# Patient Record
Sex: Female | Born: 1960
Health system: Southern US, Community
[De-identification: ages and names within clinical notes are randomized; demographics above are authoritative.]

## PROBLEM LIST (undated history)

## (undated) DIAGNOSIS — H3552 Pigmentary retinal dystrophy: Secondary | ICD-10-CM

## (undated) DIAGNOSIS — T7840XA Allergy, unspecified, initial encounter: Secondary | ICD-10-CM

## (undated) DIAGNOSIS — F419 Anxiety disorder, unspecified: Secondary | ICD-10-CM

## (undated) DIAGNOSIS — M1990 Unspecified osteoarthritis, unspecified site: Secondary | ICD-10-CM

## (undated) DIAGNOSIS — F329 Major depressive disorder, single episode, unspecified: Secondary | ICD-10-CM

## (undated) DIAGNOSIS — N83209 Unspecified ovarian cyst, unspecified side: Secondary | ICD-10-CM

## (undated) DIAGNOSIS — G8929 Other chronic pain: Secondary | ICD-10-CM

## (undated) DIAGNOSIS — E669 Obesity, unspecified: Secondary | ICD-10-CM

## (undated) DIAGNOSIS — K219 Gastro-esophageal reflux disease without esophagitis: Secondary | ICD-10-CM

## (undated) DIAGNOSIS — K589 Irritable bowel syndrome without diarrhea: Secondary | ICD-10-CM

## (undated) DIAGNOSIS — D649 Anemia, unspecified: Secondary | ICD-10-CM

## (undated) DIAGNOSIS — M549 Dorsalgia, unspecified: Secondary | ICD-10-CM

## (undated) DIAGNOSIS — F32A Depression, unspecified: Secondary | ICD-10-CM

## (undated) DIAGNOSIS — A4902 Methicillin resistant Staphylococcus aureus infection, unspecified site: Secondary | ICD-10-CM

## (undated) DIAGNOSIS — I10 Essential (primary) hypertension: Secondary | ICD-10-CM

## (undated) HISTORY — DX: Unspecified ovarian cyst, unspecified side: N83.209

## (undated) HISTORY — PX: APPENDECTOMY: SHX54

## (undated) HISTORY — DX: Methicillin resistant Staphylococcus aureus infection, unspecified site: A49.02

## (undated) HISTORY — PX: KNEE ARTHROSCOPY W/ MENISCAL REPAIR: SHX1877

## (undated) HISTORY — DX: Allergy, unspecified, initial encounter: T78.40XA

## (undated) HISTORY — DX: Essential (primary) hypertension: I10

## (undated) HISTORY — DX: Dorsalgia, unspecified: M54.9

## (undated) HISTORY — DX: Pigmentary retinal dystrophy: H35.52

## (undated) HISTORY — PX: CHOLECYSTECTOMY: SHX55

## (undated) HISTORY — DX: Irritable bowel syndrome, unspecified: K58.9

## (undated) HISTORY — DX: Obesity, unspecified: E66.9

## (undated) HISTORY — DX: Gastro-esophageal reflux disease without esophagitis: K21.9

## (undated) HISTORY — DX: Other chronic pain: G89.29

---
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
## 1997-07-05 ENCOUNTER — Inpatient Hospital Stay (HOSPITAL_COMMUNITY): Admission: RE | Admit: 1997-07-05 | Discharge: 1997-07-06 | Payer: Self-pay | Admitting: Surgery

## 1998-07-14 ENCOUNTER — Encounter: Payer: Self-pay | Admitting: Obstetrics and Gynecology

## 1998-07-14 ENCOUNTER — Ambulatory Visit (HOSPITAL_COMMUNITY): Admission: RE | Admit: 1998-07-14 | Discharge: 1998-07-14 | Payer: Self-pay | Admitting: Obstetrics and Gynecology

## 1998-07-19 HISTORY — PX: ENDOMETRIAL BIOPSY: SHX622

## 1998-08-19 ENCOUNTER — Encounter (INDEPENDENT_AMBULATORY_CARE_PROVIDER_SITE_OTHER): Payer: Self-pay

## 1998-08-19 ENCOUNTER — Other Ambulatory Visit: Admission: RE | Admit: 1998-08-19 | Discharge: 1998-08-19 | Payer: Self-pay | Admitting: Obstetrics and Gynecology

## 1999-08-25 ENCOUNTER — Other Ambulatory Visit: Admission: RE | Admit: 1999-08-25 | Discharge: 1999-08-25 | Payer: Self-pay | Admitting: Family Medicine

## 2000-03-02 ENCOUNTER — Encounter (INDEPENDENT_AMBULATORY_CARE_PROVIDER_SITE_OTHER): Payer: Self-pay

## 2000-03-02 ENCOUNTER — Other Ambulatory Visit: Admission: RE | Admit: 2000-03-02 | Discharge: 2000-03-02 | Payer: Self-pay | Admitting: Obstetrics and Gynecology

## 2001-07-19 ENCOUNTER — Other Ambulatory Visit: Admission: RE | Admit: 2001-07-19 | Discharge: 2001-07-19 | Payer: Self-pay | Admitting: Family Medicine

## 2002-07-31 ENCOUNTER — Other Ambulatory Visit: Admission: RE | Admit: 2002-07-31 | Discharge: 2002-07-31 | Payer: Self-pay | Admitting: Family Medicine

## 2003-08-28 ENCOUNTER — Encounter: Payer: Self-pay | Admitting: Family Medicine

## 2003-08-28 ENCOUNTER — Other Ambulatory Visit: Admission: RE | Admit: 2003-08-28 | Discharge: 2003-08-28 | Payer: Self-pay | Admitting: Family Medicine

## 2003-08-28 LAB — CONVERTED CEMR LAB: Pap Smear: NORMAL

## 2004-07-27 ENCOUNTER — Ambulatory Visit: Payer: Self-pay | Admitting: Family Medicine

## 2005-02-05 ENCOUNTER — Ambulatory Visit: Payer: Self-pay | Admitting: Family Medicine

## 2005-02-08 ENCOUNTER — Ambulatory Visit: Payer: Self-pay | Admitting: Family Medicine

## 2005-03-22 ENCOUNTER — Ambulatory Visit: Payer: Self-pay | Admitting: Family Medicine

## 2005-06-03 ENCOUNTER — Ambulatory Visit: Payer: Self-pay | Admitting: Specialist

## 2005-08-10 ENCOUNTER — Ambulatory Visit: Payer: Self-pay | Admitting: Family Medicine

## 2005-08-24 ENCOUNTER — Encounter: Admission: RE | Admit: 2005-08-24 | Discharge: 2005-08-24 | Payer: Self-pay | Admitting: Surgery

## 2005-09-03 ENCOUNTER — Encounter: Admission: RE | Admit: 2005-09-03 | Discharge: 2005-09-03 | Payer: Self-pay | Admitting: Surgery

## 2006-01-18 DIAGNOSIS — A4902 Methicillin resistant Staphylococcus aureus infection, unspecified site: Secondary | ICD-10-CM

## 2006-01-18 HISTORY — DX: Methicillin resistant Staphylococcus aureus infection, unspecified site: A49.02

## 2006-05-09 ENCOUNTER — Ambulatory Visit: Payer: Self-pay | Admitting: Family Medicine

## 2006-05-16 ENCOUNTER — Telehealth (INDEPENDENT_AMBULATORY_CARE_PROVIDER_SITE_OTHER): Payer: Self-pay | Admitting: *Deleted

## 2006-05-19 HISTORY — PX: HERNIA REPAIR: SHX51

## 2006-05-30 ENCOUNTER — Inpatient Hospital Stay (HOSPITAL_COMMUNITY): Admission: EM | Admit: 2006-05-30 | Discharge: 2006-06-06 | Payer: Self-pay | Admitting: *Deleted

## 2006-06-03 ENCOUNTER — Encounter (INDEPENDENT_AMBULATORY_CARE_PROVIDER_SITE_OTHER): Payer: Self-pay | Admitting: General Surgery

## 2006-06-10 ENCOUNTER — Inpatient Hospital Stay (HOSPITAL_COMMUNITY): Admission: EM | Admit: 2006-06-10 | Discharge: 2006-06-15 | Payer: Self-pay | Admitting: *Deleted

## 2006-07-08 ENCOUNTER — Ambulatory Visit: Payer: Self-pay | Admitting: Internal Medicine

## 2006-07-11 ENCOUNTER — Inpatient Hospital Stay: Payer: Self-pay | Admitting: Surgery

## 2006-07-15 ENCOUNTER — Encounter: Payer: Self-pay | Admitting: Family Medicine

## 2006-07-15 DIAGNOSIS — H3552 Pigmentary retinal dystrophy: Secondary | ICD-10-CM | POA: Insufficient documentation

## 2006-07-15 DIAGNOSIS — G43909 Migraine, unspecified, not intractable, without status migrainosus: Secondary | ICD-10-CM | POA: Insufficient documentation

## 2006-07-15 DIAGNOSIS — M545 Low back pain, unspecified: Secondary | ICD-10-CM | POA: Insufficient documentation

## 2006-07-15 DIAGNOSIS — N946 Dysmenorrhea, unspecified: Secondary | ICD-10-CM | POA: Insufficient documentation

## 2006-07-15 DIAGNOSIS — F341 Dysthymic disorder: Secondary | ICD-10-CM | POA: Insufficient documentation

## 2006-07-15 DIAGNOSIS — I8390 Asymptomatic varicose veins of unspecified lower extremity: Secondary | ICD-10-CM | POA: Insufficient documentation

## 2006-07-15 DIAGNOSIS — J45909 Unspecified asthma, uncomplicated: Secondary | ICD-10-CM | POA: Insufficient documentation

## 2006-07-15 DIAGNOSIS — K219 Gastro-esophageal reflux disease without esophagitis: Secondary | ICD-10-CM | POA: Insufficient documentation

## 2006-07-15 DIAGNOSIS — J309 Allergic rhinitis, unspecified: Secondary | ICD-10-CM | POA: Insufficient documentation

## 2006-07-15 DIAGNOSIS — N3941 Urge incontinence: Secondary | ICD-10-CM

## 2006-07-29 ENCOUNTER — Encounter: Payer: Self-pay | Admitting: Family Medicine

## 2006-10-19 ENCOUNTER — Encounter: Payer: Self-pay | Admitting: Family Medicine

## 2006-11-03 ENCOUNTER — Ambulatory Visit: Payer: Self-pay | Admitting: Family Medicine

## 2006-11-03 DIAGNOSIS — M2550 Pain in unspecified joint: Secondary | ICD-10-CM | POA: Insufficient documentation

## 2006-11-03 DIAGNOSIS — R609 Edema, unspecified: Secondary | ICD-10-CM | POA: Insufficient documentation

## 2006-11-03 LAB — CONVERTED CEMR LAB
Nitrite: NEGATIVE
Specific Gravity, Urine: 1.01
pH: 6.5

## 2006-11-04 ENCOUNTER — Encounter (INDEPENDENT_AMBULATORY_CARE_PROVIDER_SITE_OTHER): Payer: Self-pay | Admitting: Internal Medicine

## 2007-01-05 ENCOUNTER — Telehealth: Payer: Self-pay | Admitting: Family Medicine

## 2007-05-12 ENCOUNTER — Ambulatory Visit: Payer: Self-pay | Admitting: Family Medicine

## 2007-05-12 DIAGNOSIS — K432 Incisional hernia without obstruction or gangrene: Secondary | ICD-10-CM | POA: Insufficient documentation

## 2007-05-12 DIAGNOSIS — R350 Frequency of micturition: Secondary | ICD-10-CM

## 2007-05-12 LAB — CONVERTED CEMR LAB
Blood in Urine, dipstick: NEGATIVE
Glucose, Urine, Semiquant: NEGATIVE
Ketones, urine, test strip: NEGATIVE
Urobilinogen, UA: 0.2
WBC Urine, dipstick: NEGATIVE

## 2007-05-15 LAB — CONVERTED CEMR LAB
Albumin: 3.4 g/dL — ABNORMAL LOW (ref 3.5–5.2)
Alkaline Phosphatase: 68 units/L (ref 39–117)
BUN: 9 mg/dL (ref 6–23)
Calcium: 9 mg/dL (ref 8.4–10.5)
Eosinophils Absolute: 0.2 10*3/uL (ref 0.0–0.7)
Eosinophils Relative: 2 % (ref 0.0–5.0)
GFR calc Af Amer: 140 mL/min
GFR calc non Af Amer: 115 mL/min
HCT: 30.6 % — ABNORMAL LOW (ref 36.0–46.0)
HDL: 33.6 mg/dL — ABNORMAL LOW (ref >39.0)
Hemoglobin: 10.1 g/dL — ABNORMAL LOW (ref 12.0–15.0)
MCV: 82.1 fL (ref 78.0–100.0)
Monocytes Absolute: 0.6 10*3/uL (ref 0.1–1.0)
Neutro Abs: 6.2 10*3/uL (ref 1.4–7.7)
Platelets: 324 10*3/uL (ref 150–400)
Potassium: 3.9 meq/L (ref 3.5–5.1)
RDW: 15.5 % — ABNORMAL HIGH (ref 11.5–14.6)
Sodium: 140 meq/L (ref 135–145)
TSH: 1.1 microintl units/mL (ref 0.35–5.50)
Total Protein: 6.6 g/dL (ref 6.0–8.3)
Triglycerides: 190 mg/dL — ABNORMAL HIGH (ref 0–149)

## 2007-05-25 ENCOUNTER — Encounter: Payer: Self-pay | Admitting: Gastroenterology

## 2007-05-25 ENCOUNTER — Encounter: Admission: RE | Admit: 2007-05-25 | Discharge: 2007-05-25 | Payer: Self-pay | Admitting: General Surgery

## 2007-06-05 ENCOUNTER — Ambulatory Visit: Payer: Self-pay | Admitting: Family Medicine

## 2007-06-05 DIAGNOSIS — L253 Unspecified contact dermatitis due to other chemical products: Secondary | ICD-10-CM | POA: Insufficient documentation

## 2007-06-13 ENCOUNTER — Ambulatory Visit: Payer: Self-pay | Admitting: Family Medicine

## 2007-06-13 ENCOUNTER — Encounter: Payer: Self-pay | Admitting: Family Medicine

## 2007-06-27 ENCOUNTER — Encounter: Payer: Self-pay | Admitting: Family Medicine

## 2007-07-11 ENCOUNTER — Ambulatory Visit: Payer: Self-pay | Admitting: Family Medicine

## 2007-07-19 ENCOUNTER — Telehealth: Payer: Self-pay | Admitting: Family Medicine

## 2007-08-29 ENCOUNTER — Ambulatory Visit (HOSPITAL_COMMUNITY): Admission: RE | Admit: 2007-08-29 | Discharge: 2007-08-29 | Payer: Self-pay | Admitting: Family Medicine

## 2007-09-13 ENCOUNTER — Ambulatory Visit: Payer: Self-pay | Admitting: Obstetrics and Gynecology

## 2007-11-15 ENCOUNTER — Telehealth: Payer: Self-pay | Admitting: Family Medicine

## 2007-12-05 ENCOUNTER — Telehealth: Payer: Self-pay | Admitting: Family Medicine

## 2008-01-23 ENCOUNTER — Encounter: Payer: Self-pay | Admitting: Family Medicine

## 2008-03-18 DIAGNOSIS — I10 Essential (primary) hypertension: Secondary | ICD-10-CM

## 2008-03-18 HISTORY — PX: CYSTOSCOPY: SUR368

## 2008-03-18 HISTORY — DX: Essential (primary) hypertension: I10

## 2008-03-27 ENCOUNTER — Encounter: Payer: Self-pay | Admitting: Family Medicine

## 2008-03-29 ENCOUNTER — Ambulatory Visit: Payer: Self-pay | Admitting: Family Medicine

## 2008-03-29 DIAGNOSIS — I10 Essential (primary) hypertension: Secondary | ICD-10-CM | POA: Insufficient documentation

## 2008-03-29 DIAGNOSIS — R197 Diarrhea, unspecified: Secondary | ICD-10-CM

## 2008-04-01 LAB — CONVERTED CEMR LAB
BUN: 14 mg/dL (ref 6–23)
Basophils Absolute: 0.1 10*3/uL (ref 0.0–0.1)
Bilirubin, Direct: 0.1 mg/dL (ref 0.0–0.3)
Chloride: 103 meq/L (ref 96–112)
Cholesterol: 192 mg/dL (ref 0–200)
Eosinophils Absolute: 0.2 10*3/uL (ref 0.0–0.7)
Glucose, Bld: 87 mg/dL (ref 70–99)
HCT: 38 % (ref 36.0–46.0)
HDL: 33 mg/dL — ABNORMAL LOW (ref >39.0)
Hemoglobin: 12.6 g/dL (ref 12.0–15.0)
MCHC: 33.1 g/dL (ref 30.0–36.0)
MCV: 82.1 fL (ref 78.0–100.0)
Monocytes Absolute: 0.5 10*3/uL (ref 0.1–1.0)
Neutro Abs: 6.9 10*3/uL (ref 1.4–7.7)
Phosphorus: 4.1 mg/dL (ref 2.3–4.6)
Platelets: 285 10*3/uL (ref 150–400)
Potassium: 4.1 meq/L (ref 3.5–5.1)
RDW: 14.8 % — ABNORMAL HIGH (ref 11.5–14.6)
Sodium: 140 meq/L (ref 135–145)
TSH: 0.66 microintl units/mL (ref 0.35–5.50)
Total Bilirubin: 0.7 mg/dL (ref 0.3–1.2)
Total CHOL/HDL Ratio: 5.8
Triglycerides: 118 mg/dL (ref 0–149)

## 2008-05-09 ENCOUNTER — Ambulatory Visit: Payer: Self-pay | Admitting: Family Medicine

## 2008-05-09 DIAGNOSIS — E785 Hyperlipidemia, unspecified: Secondary | ICD-10-CM

## 2008-07-12 ENCOUNTER — Telehealth (INDEPENDENT_AMBULATORY_CARE_PROVIDER_SITE_OTHER): Payer: Self-pay | Admitting: Internal Medicine

## 2008-08-12 ENCOUNTER — Ambulatory Visit: Payer: Self-pay | Admitting: Obstetrics & Gynecology

## 2008-08-12 ENCOUNTER — Encounter: Payer: Self-pay | Admitting: Obstetrics & Gynecology

## 2008-08-13 ENCOUNTER — Encounter: Payer: Self-pay | Admitting: Obstetrics & Gynecology

## 2008-08-13 LAB — CONVERTED CEMR LAB
Trich, Wet Prep: NONE SEEN
WBC, Wet Prep HPF POC: NONE SEEN

## 2008-09-19 ENCOUNTER — Telehealth: Payer: Self-pay | Admitting: Family Medicine

## 2008-10-09 ENCOUNTER — Ambulatory Visit: Payer: Self-pay | Admitting: Family Medicine

## 2008-10-09 DIAGNOSIS — M79609 Pain in unspecified limb: Secondary | ICD-10-CM

## 2008-10-10 ENCOUNTER — Ambulatory Visit: Payer: Self-pay | Admitting: Family Medicine

## 2008-10-10 ENCOUNTER — Encounter: Payer: Self-pay | Admitting: Family Medicine

## 2008-10-17 ENCOUNTER — Ambulatory Visit: Payer: Self-pay

## 2008-11-13 ENCOUNTER — Ambulatory Visit: Payer: Self-pay | Admitting: Obstetrics and Gynecology

## 2008-11-13 ENCOUNTER — Encounter: Payer: Self-pay | Admitting: Family Medicine

## 2008-11-14 ENCOUNTER — Encounter: Payer: Self-pay | Admitting: Obstetrics and Gynecology

## 2008-11-14 LAB — CONVERTED CEMR LAB: Yeast Wet Prep HPF POC: NONE SEEN

## 2008-12-09 ENCOUNTER — Telehealth: Payer: Self-pay | Admitting: Family Medicine

## 2008-12-10 ENCOUNTER — Ambulatory Visit: Payer: Self-pay | Admitting: Family Medicine

## 2008-12-10 DIAGNOSIS — B370 Candidal stomatitis: Secondary | ICD-10-CM

## 2008-12-17 ENCOUNTER — Ambulatory Visit: Payer: Self-pay | Admitting: Ophthalmology

## 2008-12-18 HISTORY — PX: EYE SURGERY: SHX253

## 2008-12-24 ENCOUNTER — Ambulatory Visit: Payer: Self-pay | Admitting: Ophthalmology

## 2009-01-08 ENCOUNTER — Ambulatory Visit: Payer: Self-pay | Admitting: Family Medicine

## 2009-01-11 LAB — CONVERTED CEMR LAB
ALT: 21 units/L (ref 0–35)
AST: 16 units/L (ref 0–37)
Cholesterol: 175 mg/dL (ref 0–200)
HDL: 38.8 mg/dL — ABNORMAL LOW (ref >39.00)
LDL Cholesterol: 111 mg/dL — ABNORMAL HIGH (ref 0–99)
VLDL: 24.8 mg/dL (ref 0.0–40.0)

## 2009-01-15 ENCOUNTER — Ambulatory Visit: Payer: Self-pay | Admitting: Family Medicine

## 2009-01-15 DIAGNOSIS — K5900 Constipation, unspecified: Secondary | ICD-10-CM | POA: Insufficient documentation

## 2009-04-08 ENCOUNTER — Telehealth: Payer: Self-pay | Admitting: Family Medicine

## 2009-05-20 ENCOUNTER — Telehealth: Payer: Self-pay | Admitting: Family Medicine

## 2009-05-21 ENCOUNTER — Ambulatory Visit: Payer: Self-pay | Admitting: Family Medicine

## 2009-05-21 DIAGNOSIS — M5380 Other specified dorsopathies, site unspecified: Secondary | ICD-10-CM | POA: Insufficient documentation

## 2009-06-02 ENCOUNTER — Telehealth: Payer: Self-pay | Admitting: Family Medicine

## 2009-08-07 ENCOUNTER — Encounter (INDEPENDENT_AMBULATORY_CARE_PROVIDER_SITE_OTHER): Payer: Self-pay | Admitting: *Deleted

## 2009-08-07 ENCOUNTER — Ambulatory Visit: Payer: Self-pay | Admitting: Family Medicine

## 2009-08-07 DIAGNOSIS — M25569 Pain in unspecified knee: Secondary | ICD-10-CM | POA: Insufficient documentation

## 2009-08-07 DIAGNOSIS — K589 Irritable bowel syndrome without diarrhea: Secondary | ICD-10-CM | POA: Insufficient documentation

## 2009-08-07 LAB — CONVERTED CEMR LAB
Bacteria, UA: 0
Epithelial cells, urine: 0 /lpf
Glucose, Urine, Semiquant: NEGATIVE
Ketones, urine, test strip: NEGATIVE
Nitrite: NEGATIVE
RBC / HPF: 0
Urine crystals, microscopic: 0 /hpf
WBC, UA: 0 cells/hpf

## 2009-09-02 ENCOUNTER — Ambulatory Visit: Payer: Self-pay | Admitting: Family Medicine

## 2009-09-04 LAB — CONVERTED CEMR LAB
ALT: 17 units/L (ref 0–35)
AST: 14 units/L (ref 0–37)
Basophils Absolute: 0 10*3/uL (ref 0.0–0.1)
Basophils Relative: 0 % (ref 0–1)
Bilirubin, Direct: 0.1 mg/dL (ref 0.0–0.3)
Calcium: 9.4 mg/dL (ref 8.4–10.5)
Cholesterol: 181 mg/dL (ref 0–200)
Eosinophils Absolute: 0.1 10*3/uL (ref 0.0–0.7)
Eosinophils Relative: 2 % (ref 0–5)
Glucose, Bld: 102 mg/dL — ABNORMAL HIGH (ref 70–99)
HCT: 37.9 % (ref 36.0–46.0)
Lymphs Abs: 1.9 10*3/uL (ref 0.7–4.0)
MCHC: 32.5 g/dL (ref 30.0–36.0)
MCV: 86.1 fL (ref 78.0–100.0)
Neutrophils Relative %: 65 % (ref 43–77)
Platelets: 263 10*3/uL (ref 150–400)
RDW: 13.9 % (ref 11.5–15.5)
Sodium: 138 meq/L (ref 135–145)
TSH: 1.345 microintl units/mL (ref 0.350–4.500)
Total CHOL/HDL Ratio: 4.8
Total Protein: 6.9 g/dL (ref 6.0–8.3)
Triglycerides: 137 mg/dL (ref <150)

## 2009-09-05 ENCOUNTER — Ambulatory Visit: Payer: Self-pay | Admitting: Family Medicine

## 2009-09-08 ENCOUNTER — Encounter: Payer: Self-pay | Admitting: Family Medicine

## 2009-09-10 ENCOUNTER — Telehealth: Payer: Self-pay | Admitting: Family Medicine

## 2009-09-18 ENCOUNTER — Encounter: Payer: Self-pay | Admitting: Family Medicine

## 2009-09-18 LAB — HM MAMMOGRAPHY

## 2009-09-26 ENCOUNTER — Ambulatory Visit: Payer: Self-pay | Admitting: Gastroenterology

## 2009-09-26 ENCOUNTER — Encounter (INDEPENDENT_AMBULATORY_CARE_PROVIDER_SITE_OTHER): Payer: Self-pay | Admitting: *Deleted

## 2009-09-26 DIAGNOSIS — R1013 Epigastric pain: Secondary | ICD-10-CM

## 2009-09-26 DIAGNOSIS — K3189 Other diseases of stomach and duodenum: Secondary | ICD-10-CM | POA: Insufficient documentation

## 2009-09-29 LAB — CONVERTED CEMR LAB
ALT: 21 units/L (ref 0–35)
Albumin: 3.8 g/dL (ref 3.5–5.2)
Basophils Relative: 0.6 % (ref 0.0–3.0)
Calcium: 9.5 mg/dL (ref 8.4–10.5)
Eosinophils Absolute: 0.2 10*3/uL (ref 0.0–0.7)
GFR calc non Af Amer: 100.19 mL/min (ref >60)
Hemoglobin: 12.2 g/dL (ref 12.0–15.0)
MCHC: 34.5 g/dL (ref 30.0–36.0)
MCV: 84.9 fL (ref 78.0–100.0)
Monocytes Absolute: 0.5 10*3/uL (ref 0.1–1.0)
Neutro Abs: 6.4 10*3/uL (ref 1.4–7.7)
Neutrophils Relative %: 68.7 % (ref 43.0–77.0)
RBC: 4.17 M/uL (ref 3.87–5.11)
Total Bilirubin: 0.7 mg/dL (ref 0.3–1.2)
Total Protein: 6.6 g/dL (ref 6.0–8.3)

## 2009-10-02 ENCOUNTER — Encounter (INDEPENDENT_AMBULATORY_CARE_PROVIDER_SITE_OTHER): Payer: Self-pay | Admitting: *Deleted

## 2009-10-02 ENCOUNTER — Ambulatory Visit (HOSPITAL_COMMUNITY): Admission: RE | Admit: 2009-10-02 | Discharge: 2009-10-02 | Payer: Self-pay | Admitting: Gastroenterology

## 2009-10-02 ENCOUNTER — Ambulatory Visit: Payer: Self-pay | Admitting: Gastroenterology

## 2009-10-29 ENCOUNTER — Telehealth: Payer: Self-pay | Admitting: Family Medicine

## 2009-12-10 ENCOUNTER — Telehealth (INDEPENDENT_AMBULATORY_CARE_PROVIDER_SITE_OTHER): Payer: Self-pay | Admitting: *Deleted

## 2010-02-06 ENCOUNTER — Telehealth: Payer: Self-pay | Admitting: Family Medicine

## 2010-02-08 ENCOUNTER — Encounter: Payer: Self-pay | Admitting: *Deleted

## 2010-02-17 NOTE — Assessment & Plan Note (Signed)
Summary: CPX/RBH  R/S FROM 07/07/09   Vital Signs:  Patient profile:   50 year old female Height:      64 inches Weight:      313.50 pounds BMI:     54.01 Temp:     98.7 degrees F oral Pulse rate:   80 / minute Pulse rhythm:   regular BP sitting:   128 / 82  (left arm) Cuff size:   large  Vitals Entered By: Lewanda Rife LPN (September 05, 2009 10:39 AM) CC: CPX  LMP 10/2008   History of Present Illness: here for wellness exam and to rev chronic med problems   feeling fair in general lots of stress  husb sick with diverticulitis and perf - in hosp -- will have to have hemicolectomy is problem with being out of work- financial she is likely also loosing her job-- upsetting situation, had to take some pepcid will have ins thru end of sept has appt on sept 9th for GI   given the circumstances doing good   wt is down 9 lb from last visit !-- is working on not stress eating -- proud of herself  morbidly obese with bmi of 54  HTN in good control with 128/82 today  used to see gyn  had endometrial ablation  had bad menses in fall and also vaginitis -- pap then ? no menses since then  last 3 paps - she thinks were normal    mam --needs one / over a year  has fibrocystic breasts - nothing new   has appt fri for derm - skin exam self exam   Td 06  gluc 102   lipids good and stable with trig 137 and HDL 38 and LDL 116    Allergies: 1)  ! Septra Ds (Sulfamethoxazole-Trimethoprim) 2)  ! Voltaren 3)  ! Flagyl 4)  * Bextra 5)  Lodine 6)  Detrol La (Tolterodine Tartrate) 7)  * Birth Control  Past History:  Past Medical History: Last updated: 01/15/2009 Allergic rhinitis Asthma GERD chroinic pain back and legs- OA spine  morbid obesity retinitis pigmentosa 08 MRSA r leg  ovarian cyst  HTN (3/10) cataracts  IBS  urol-- [facility]   Past Surgical History: Last updated: 01/15/2009 Had surgeries last month for ruptured hernia repair after fall and then two  more surgeries within as many weeks for complications.- May 2008 appy 08 Cholecystectomy 06/99 C-Section Umbilical hernia repair 06/99 hernia sx 08  Bladder surgery- age 14 (dilation?) Endometrial bx. negative 07/00 Endo negative 02/02 cystoscopy neg 3/10 meniscal tear in L knee cataract removal L 12/10  Family History: Last updated: 2008/04/10 Father: Deceased Mother: Depression, thyroid (general mental illness), ? HTN  distant maternal family- lots of high blood pressure  Siblings: 2 brothers, one with depression, injuries secondary MVA, one deceased CV- father's side- grandparents and aunts /uncles  HBP- MGM DM- Mat. uncle Pcousins - with cancer (? what type)  Social History: Last updated: 08/07/2009 Marital Status: Married Children: 1 Occupation:  non smoker  vision impaired  works for [employer]   Risk Factors: Smoking Status: never (07/15/2006)  Review of Systems General:  Complains of fatigue; denies loss of appetite and malaise. Eyes:  Denies blurring and eye irritation. CV:  Denies chest pain or discomfort, palpitations, and shortness of breath with exertion. Resp:  Denies cough, shortness of breath, and wheezing. GI:  Complains of indigestion; denies abdominal pain, change in bowel habits, and nausea. GU:  Denies dysuria and  urinary frequency. MS:  Complains of joint pain and stiffness; denies cramps and muscle weakness. Derm:  Denies itching, lesion(s), poor wound healing, and rash. Neuro:  Denies numbness and tingling. Psych:  Denies anxiety and depression. Endo:  Denies excessive thirst and excessive urination. Heme:  Denies abnormal bruising and bleeding.  Physical Exam  General:  morbidly obese and well  pt is blind  Head:  normocephalic, atraumatic, and no abnormalities observed.   Eyes:  vision grossly intact, pupils equal, pupils round, and pupils reactive to light.  no conjunctival pallor, injection or icterus  Ears:  R ear normal  and L ear normal.   Nose:  no nasal discharge.   Mouth:  pharynx pink and moist.   Neck:  supple with full rom and no masses or thyromegally, no JVD or carotid bruit  Chest Wall:  No deformities, masses, or tenderness noted. Breasts:  No mass, nodules, thickening, tenderness, bulging, retraction, inflamation, nipple discharge or skin changes noted.   Lungs:  Normal respiratory effort, chest expands symmetrically. Lungs are clear to auscultation, no crackles or wheezes. Heart:  Normal rate and regular rhythm. S1 and S2 normal without gallop, murmur, click, rub or other extra sounds. Abdomen:  Bowel sounds positive,abdomen soft and non-tender without masses, organomegaly or hernias noted. no renal bruits  Msk:  No deformity or scoliosis noted of thoracic or lumbar spine.  poor rom knees Pulses:  R and L carotid,radial,femoral,dorsalis pedis and posterior tibial pulses are full and equal bilaterally Extremities:  trace pedal edema Neurologic:  sensation intact to light touch, gait normal, and DTRs symmetrical and normal.   Skin:  Intact without suspicious lesions or rashes some lentigos  Cervical Nodes:  No lymphadenopathy noted Axillary Nodes:  No palpable lymphadenopathy Inguinal Nodes:  No significant adenopathy Psych:  normal affect, talkative and pleasant    Impression & Recommendations:  Problem # 1:  HEALTH MAINTENANCE EXAM (ICD-V70.0) Assessment Comment Only reviewed health habits including diet, exercise and skin cancer prevention reviewed health maintenance list and family history again stressed imp of wt loss and progress so far  ? thinks pap was in fall  / too early  Problem # 2:  OTHER SCREENING MAMMOGRAM (ICD-V76.12) annual mammogram scheduled adv pt to continue regular self breast exams non remarkable breast exam today  Orders: Radiology Referral (Radiology)  Problem # 3:  HYPERLIPIDEMIA (ICD-272.4) Assessment: Unchanged  lipids are in good control with diet  currently will continue to follow rev low sat fat diet   Labs Reviewed: SGOT: 14 (09/02/2009)   SGPT: 17 (09/02/2009)   HDL:38 (09/02/2009), 38.80 (01/08/2009)  LDL:116 (09/02/2009), 111 (01/08/2009)  Chol:181 (09/02/2009), 175 (01/08/2009)  Trig:137 (09/02/2009), 124.0 (01/08/2009)  Problem # 4:  ESSENTIAL HYPERTENSION (ICD-401.9) Assessment: Unchanged  ok with lisinopril low dose currently enc further wt loss  Her updated medication list for this problem includes:    Lisinopril 5 Mg Tabs (Lisinopril) .Marland Kitchen... 1 by mouth once daily in am  BP today: 128/82 Prior BP: 130/80 (08/07/2009)  Labs Reviewed: K+: 4.5 (09/02/2009) Creat: : 0.75 (09/02/2009)   Chol: 181 (09/02/2009)   HDL: 38 (09/02/2009)   LDL: 116 (09/02/2009)   TG: 137 (09/02/2009)  Problem # 5:  GERD (ICD-530.81) adding pepcid as needed for gastritis type symptoms will f/u with GI in fall as planned for eval  Her updated medication list for this problem includes:    Nexium 40 Mg Cpdr (Esomeprazole magnesium) .Marland Kitchen... 1 by mouth once daily once daily in am  Famotidine 10 Mg Tabs (Famotidine) ..... Otc as directed.  Complete Medication List: 1)  Alprazolam 1 Mg Tabs (Alprazolam) .Marland Kitchen.. 1 by mouth up to three times a day as needed  warn- may sedate 2)  Flonase 50 Mcg/act Susp (Fluticasone propionate) .... 2 puffs in each nostril daily 3)  Advair Diskus 100-50 Mcg/dose Misc (Fluticasone-salmeterol) .Marland Kitchen.. 1 puff twice a day. 4)  Zoloft 50 Mg Tabs (Sertraline hcl) .... 1/2 tab daily by mouth 5)  Allegra 180 Mg Tabs (Fexofenadine hcl) .Marland Kitchen.. 1 tab daily 6)  Singulair 10 Mg Tabs (Montelukast sodium) .Marland Kitchen.. 1 tab daily 7)  Nexium 40 Mg Cpdr (Esomeprazole magnesium) .Marland Kitchen.. 1 by mouth once daily once daily in am 8)  One-daily Multivitamins Tabs (Multiple vitamin) .... Take one tablet by mouth every other day 9)  Calcium  .... Take two  by mouth daily 10)  B Complex 100 Tabs (B complex vitamins) .... Take one tablet by mouth every other  day 11)  Vesicare 5 Mg Tabs (Solifenacin succinate) .Marland Kitchen.. 1 by mouth every other day for bladder 12)  Lisinopril 5 Mg Tabs (Lisinopril) .Marland Kitchen.. 1 by mouth once daily in am 13)  Nystatin 100000 Unit/ml Susp (Nystatin) .Marland Kitchen.. 1 teaspoon three times a day swish and swallow until mouth is clear as needed 14)  Flexeril 10 Mg Tabs (Cyclobenzaprine hcl) .Marland Kitchen.. 1 by mouth up to three times a day as needed back spasm careful of sedation 15)  Vicodin 5-500 Mg Tabs (Hydrocodone-acetaminophen) .Marland Kitchen.. 1 tab every 6 hours as needed pain. 16)  Vitamin C 1000 Mg Tabs (Ascorbic acid) .... Take 1 tablet by mouth once a day 17)  Omega 3-6-9 (1000mg )  .... Take one tab by mouth daily 18)  Epidrin 325-65-100 Mg Caps (Apap-isometheptene-dichloral) .... Take one every 6 hours as needed 19)  Ventolin Hfa 108 (90 Base) Mcg/act Aers (Albuterol sulfate) .... As needed for asthma attack 20)  Citrucel 500 Mg Tabs (Methylcellulose (laxative)) .... Take one tab twice a day every other day. 21)  Famotidine 10 Mg Tabs (Famotidine) .... Otc as directed.  Patient Instructions: 1)  labs look good  2)  keep up the good work with weight loss- keep it up  3)  stay as active as possible  4)  no change in medicine  5)  we will schedule mammogram at check out  6)  please send for last pap smear from [facility]   Current Allergies (reviewed today): ! SEPTRA DS (SULFAMETHOXAZOLE-TRIMETHOPRIM) ! VOLTAREN ! FLAGYL * BEXTRA LODINE DETROL LA (TOLTERODINE TARTRATE) * BIRTH CONTROL

## 2010-02-17 NOTE — Letter (Signed)
Summary: Appt Reminder 2  [facility]  [address] [place], [place] [address]   Phone: [phone]  Fax: [phone]        October 02, 2009 MRN: [id number]    Monica Madden [address] [place], [place]  [address]    Dear Ms. Mackins,   You have a return appointment with Dr. Christella Hartigan on 11/18/09 at 9:15 am.  Please remember to bring a complete list of the medicines you are taking, your insurance card and your co-pay.  If you have to cancel or reschedule this appointment, please call before 5:00 pm the evening before to avoid a cancellation fee.  If you have any questions or concerns, please call [phone].    Sincerely,    Chales Abrahams CMA (AAMA)  Appended Document: Appt Reminder 2 letter mailed

## 2010-02-17 NOTE — Procedures (Signed)
Summary: Instructions for procedure/[place]  Instructions for procedure/[place]   Imported By: Sherian Rein 10/02/2009 10:11:08  _____________________________________________________________________  External Attachment:    Type:   Image     Comment:   External Document

## 2010-02-17 NOTE — Letter (Signed)
Summary: New Patient letter  [facility]  [address] [place], [place] [address]   Phone: [phone]  Fax: [phone]       08/07/2009 MRN: [id number]  Monica Madden [address] [place], [place]  [address]  Dear Monica Madden,  Welcome to the Gastroenterology Division at [facility].    You are scheduled to see Dr.  Rob Bunting on September 26, 2009 at 2:30pm on the 3rd floor at [facility], [address]  We ask that you try to arrive at our office 15 minutes prior to your appointment time to allow for check-in.  We would like you to complete the enclosed self-administered evaluation form prior to your visit and bring it with you on the day of your appointment.  We will review it with you.  Also, please bring a complete list of all your medications or, if you prefer, bring the medication bottles and we will list them.  Please bring your insurance card so that we may make a copy of it.  If your insurance requires a referral to see a specialist, please bring your referral form from your primary care physician.  Co-payments are due at the time of your visit and may be paid by cash, check or credit card.     Your office visit will consist of a consult with your physician (includes a physical exam), any laboratory testing he/she may order, scheduling of any necessary diagnostic testing (e.g. x-ray, ultrasound, CT-scan), and scheduling of a procedure (e.g. Endoscopy, Colonoscopy) if required.  Please allow enough time on your schedule to allow for any/all of these possibilities.    If you cannot keep your appointment, please call [phone] to cancel or reschedule prior to your appointment date.  This allows [place] the opportunity to schedule an appointment for another patient in need of care.  If you do not cancel or reschedule by 5 p.m. the business day prior to your appointment date, you will be charged a $50.00 late cancellation/no-show fee.    Thank  you for choosing [facility] for your medical needs.  We appreciate the opportunity to care for you.  Please visit [place] at our website  to learn more about our practice.                     Sincerely,                                                             The Gastroenterology Division

## 2010-02-17 NOTE — Letter (Signed)
Summary: [facility] Instructions  [facility]  [address] [place], [place] [address]   Phone: [phone]  Fax: [phone]       Monica Madden    [birth year]    MRN: [id number]        Procedure Day /Date:10/02/09  THURS     Arrival Time:1130 am     Procedure Time:1230 pm     Location of Procedure:                     X  [facility] ( Outpatient Registration)   PREPARATION FOR COLONOSCOPY WITH MOVIPREP   Starting 5 days prior to your procedure 09/27/09 do not eat nuts, seeds, popcorn, corn, beans, peas,  salads, or any raw vegetables.  Do not take any fiber supplements (e.g. Metamucil, Citrucel, and Benefiber).  THE DAY BEFORE YOUR PROCEDURE         DATE: 10/01/09  DAY: WED  1.  Drink clear liquids the entire day-NO SOLID FOOD  2.  Do not drink anything colored red or purple.  Avoid juices with pulp.  No orange juice.  3.  Drink at least 64 oz. (8 glasses) of fluid/clear liquids during the day to prevent dehydration and help the prep work efficiently.  CLEAR LIQUIDS INCLUDE: Water Jello Ice Popsicles Tea (sugar ok, no milk/cream) Powdered fruit flavored drinks Coffee (sugar ok, no milk/cream) Gatorade Juice: apple, white grape, white cranberry  Lemonade Clear bullion, consomm, broth Carbonated beverages (any kind) Strained chicken noodle soup Hard Candy                             4.  In the morning, mix first dose of MoviPrep solution:    Empty 1 Pouch A and 1 Pouch B into the disposable container    Add lukewarm drinking water to the top line of the container. Mix to dissolve    Refrigerate (mixed solution should be used within 24 hrs)  5.  Begin drinking the prep at 5:00 p.m. The MoviPrep container is divided by 4 marks.   Every 15 minutes drink the solution down to the next mark (approximately 8 oz) until the full liter is complete.   6.  Follow completed prep with 16 oz of clear liquid of your choice (Nothing red or purple).   Continue to drink clear liquids until bedtime.  7.  Before going to bed, mix second dose of MoviPrep solution:    Empty 1 Pouch A and 1 Pouch B into the disposable container    Add lukewarm drinking water to the top line of the container. Mix to dissolve    Refrigerate  THE DAY OF YOUR PROCEDURE      DATE: 10/02/09  DAY: THURS  Beginning at 730 a.m. (5 hours before procedure):         1. Every 15 minutes, drink the solution down to the next mark (approx 8 oz) until the full liter is complete.  2. Follow completed prep with 16 oz. of clear liquid of your choice.    3. You may drink clear liquids until 830 am  (4 HOURS BEFORE PROCEDURE).   MEDICATION INSTRUCTIONS  Unless otherwise instructed, you should take regular prescription medications with a small sip of water   as early as possible the morning of your procedure.        OTHER INSTRUCTIONS  You will need a responsible adult at least  48 years of age to accompany you and drive you home.   This person must remain in the waiting room during your procedure.  Wear loose fitting clothing that is easily removed.  Leave jewelry and other valuables at home.  However, you may wish to bring a book to read or  an iPod/MP3 player to listen to music as you wait for your procedure to start.  Remove all body piercing jewelry and leave at home.  Total time from sign-in until discharge is approximately 2-3 hours.  You should go home directly after your procedure and rest.  You can resume normal activities the  day after your procedure.  The day of your procedure you should not:   Drive   Make legal decisions   Operate machinery   Drink alcohol   Return to work  You will receive specific instructions about eating, activities and medications before you leave.    The above instructions have been reviewed and explained to me by   _______________________    I fully understand and can verbalize these instructions  _____________________________ Date _________

## 2010-02-17 NOTE — Progress Notes (Signed)
Summary: Cyclobenzaprine 10mg  refill  Phone Note Refill Request Call back at [phone] Message from:  [facility] on Jun 02, 2009 1:13 PM  Refills Requested: Medication #1:  FLEXERIL 10 MG TABS 1 by mouth up to three times a day as needed back spasm careful of sedation   Last Refilled: 05/20/2009 [facility] faxed  request refill for Cyclobenzaprine HCL 10mg  tab . Please advise.    Method Requested: Telephone to Pharmacy Initial call taken by: Lewanda Rife LPN,  Jun 02, 2009 1:14 PM  Follow-up for Phone Call        ? she just got #30 on 5/3 -- did she use all of them ?  Follow-up by: Judith Part MD,  Jun 02, 2009 1:26 PM  Additional Follow-up for Phone Call Additional follow up Details #1::        Spoke with pt. Pt saw chiropractor last week and back is definitely improving but if pt turns a  certain way pt has back pain so pt has been taking Flexeril. Pt said she has 3 Flexeril left.Please advise.   can refil times one - but if not imp needs f/u Additional Follow-up by: Judith Part MD,  Jun 02, 2009 4:45 PM    Additional Follow-up for Phone Call Additional follow up Details #2::    Patient notified as instructed by telephone. Medication phoned to [facility] pharmacy as instructed. Lewanda Rife LPN  Jun 02, 2009 4:53 PM   Prescriptions: FLEXERIL 10 MG TABS (CYCLOBENZAPRINE HCL) 1 by mouth up to three times a day as needed back spasm careful of sedation  #30 x 0   Entered and Authorized by:   Judith Part MD   Signed by:   Lewanda Rife LPN on 16/10/9602   Method used:   Telephoned to ...       [facility] [address] [phone]* (retail)       [address] [place], [place]  [address]       Ph: [phone]       Fax: [phone]   RxID:   [id number]

## 2010-02-17 NOTE — Miscellaneous (Signed)
Summary: CONTROLLED SUBSTANCES CONTRACT  CONTROLLED SUBSTANCES CONTRACT   Imported By: Wilder Glade 08/13/2009 16:43:04  _____________________________________________________________________  External Attachment:    Type:   Image     Comment:   External Document

## 2010-02-17 NOTE — Progress Notes (Signed)
Summary: back spasms  Phone Note Call from Patient Call back at Work Phone [phone]   Caller: Patient Call For: Judith Part MD Summary of Call: Patient says that yesterday while taking her shower she bent down to wash her feet and she pulled her back. She went to work for a little while yesterday, but was having terrible spasms and had to leave. She went home and she had to vicodin left and she took those. She called asking for appt., but there is nothing with any of the providers. She wants to know if she can have something for the pain called in to [facility]. Please advise.  Initial call taken by: Melody Comas,  May 20, 2009 9:10 AM  Follow-up for Phone Call        can try a muscle relaxer then f/u with first doc when avail (or urgent care if necessary)  careful of sedation px written on EMR for call in  Follow-up by: Judith Part MD,  May 20, 2009 10:53 AM  Additional Follow-up for Phone Call Additional follow up Details #1::        .Patient notified as instructed by telephone. Pt scheduled appt with Dr Dayton Martes on 05/21/09. Medication phoned to [facility] pharmacy as instructed. Lewanda Rife LPN  May 21, 979 12:25 PM     New/Updated Medications: FLEXERIL 10 MG TABS (CYCLOBENZAPRINE HCL) 1 by mouth up to three times a day as needed back spasm careful of sedation Prescriptions: FLEXERIL 10 MG TABS (CYCLOBENZAPRINE HCL) 1 by mouth up to three times a day as needed back spasm careful of sedation  #30 x 0   Entered and Authorized by:   Judith Part MD   Signed by:   Lewanda Rife LPN on 19/14/7829   Method used:   Telephoned to ...         RxID:   [id number]

## 2010-02-17 NOTE — Progress Notes (Signed)
Summary: requested abx for URI sxs  Phone Note Call from Patient   Caller: Patient Summary of Call: Pt called complaining of URI sxs, asked that something be called in.  I advised her that she would need to be seen first.  She says she does not have any transportation and I suggested that if she is not any better she can go to friday or saturday clinic.  She said if she is not any better she will go to urgent care tomorrow. Initial call taken by: Lowella Petties CMA, AAMA,  December 10, 2009 12:51 PM

## 2010-02-17 NOTE — Progress Notes (Signed)
Summary: Went to Walk in Clinic  Phone Note Call from Patient   Caller: Patient Call For: Judith Part MD Reason for Call: Acute Illness Summary of Call: Pt called, says she left an message on Triage line. Says she will be going to a walk in clinic, so she does not need the appt for here.  Pt says her throat is sore...fyi to Triage.Daine Gip  October 29, 2009 12:08 PM  Initial call taken by: Daine Gip,  October 29, 2009 12:08 PM  Follow-up for Phone Call        Pulled message from triage machine, had tried to call patient to set up appt., but got no answer. Will let Dr. Milinda Antis know that she went to walk in clinic.  Follow-up by: Melody Comas,  October 29, 2009 1:03 PM

## 2010-02-17 NOTE — Progress Notes (Signed)
Summary: alprazolam  Phone Note Refill Request Message from:  Patient on September 10, 2009 10:34 AM  Refills Requested: Medication #1:  ALPRAZOLAM 1 MG TABS 1 by mouth up to three times a day as needed  warn- may sedate   Last Refilled: 08/11/2009 Refill request from [facility]. [phone]  Initial call taken by: Melody Comas,  September 10, 2009 10:35 AM  Follow-up for Phone Call        px written on EMR for call in  Follow-up by: Judith Part MD,  September 10, 2009 10:40 AM  Additional Follow-up for Phone Call Additional follow up Details #1::        Medication phoned to [facility]  pharmacy as instructed. Lewanda Rife LPN  September 10, 2009 10:55 AM     Prescriptions: ALPRAZOLAM 1 MG TABS (ALPRAZOLAM) 1 by mouth up to three times a day as needed  warn- may sedate  #60 x 3   Entered and Authorized by:   Judith Part MD   Signed by:   Lewanda Rife LPN on 11/91/4782   Method used:   Telephoned to ...       [facility] [address] [phone]* (retail)       [address] [place], [place]  [address]       Ph: [phone]       Fax: [phone]   RxID:   [id number]

## 2010-02-17 NOTE — Assessment & Plan Note (Signed)
History of Present Illness Visit Type: consult  Primary GI MD: Rob Bunting MD Primary Provider: Judy Pimple, MD Requesting Provider: Judy Pimple, MD  Chief Complaint: burning in stomach and belching  History of Present Illness:     very pleasant, legally  blind 50 year old woman who has several loose stools every day.  She had c-section many years ago, post operative hernia problems. Recent repaired, appy at same time ( ventral hernia).  Post operative SBO that relieved with NG tube only.  Dehised her incision.  All this was three years ago.    Ever since the hernia problem she had bowel issues, diarrhea (up to 6-7 times a day), at times has been constipated as well.  Started taking citrucel, this helped even her out, but in psat 4-5 months her loose stools returned.  SHe is currently taking citrucel every other day.  she has never seen blood in her stool  Also has UGI symptoms. Belching, burning. Just laid off recently.  Has insurance for next 2 months. A lot of stress in life now.  Rarely will she have burning in times of no stress. Belching alot.   She takes nexium for several years 1 hour before BF.    Drinks 2 caffinated drinks a day.  No Etoh.  She takes ibuprofen for back/leg usually two a day.           Current Medications (verified): 1)  Alprazolam 1 Mg Tabs (Alprazolam) .Marland Kitchen.. 1 By Mouth Up To Three Times A Day As Needed  Warn- May Sedate 2)  Flonase 50 Mcg/act  Susp (Fluticasone Propionate) .... 2 Puffs in Each Nostril Daily 3)  Advair Diskus 100-50 Mcg/dose  Misc (Fluticasone-Salmeterol) .Marland Kitchen.. 1 Puff Twice A Day. 4)  Zoloft 25 Mg Tabs (Sertraline Hcl) .... One Tablet By Mouth At Bedtime 5)  Allegra 180 Mg  Tabs (Fexofenadine Hcl) .Marland Kitchen.. 1 Tab Daily 6)  Singulair 10 Mg  Tabs (Montelukast Sodium) .Marland Kitchen.. 1 Tab Daily 7)  Nexium 40 Mg  Cpdr (Esomeprazole Magnesium) .Marland Kitchen.. 1 By Mouth Once Daily Once Daily in Am 8)  One-Daily Multivitamins   Tabs (Multiple Vitamin) .... Take  One Tablet By Mouth Every Other Day 9)  Calcium 600 1500 Mg Tabs (Calcium Carbonate) .... One Tablet By Mouth Two Times A Day After Dinner 10)  B Complex 100  Tabs (B Complex Vitamins) .... Take One Tablet By Mouth Every Other Day 11)  Vesicare 5 Mg Tabs (Solifenacin Succinate) .Marland Kitchen.. 1 By Mouth Every Other Day For Bladder 12)  Lisinopril 5 Mg Tabs (Lisinopril) .Marland Kitchen.. 1 By Mouth Once Daily in Am 13)  Nystatin 100000 Unit/ml Susp (Nystatin) .Marland Kitchen.. 1 Teaspoon Three Times A Day Swish and Swallow Until Mouth Is Clear As Needed 14)  Flexeril 10 Mg Tabs (Cyclobenzaprine Hcl) .Marland Kitchen.. 1 By Mouth Up To Three Times A Day As Needed Back Spasm Careful of Sedation 15)  Vicodin 5-500 Mg Tabs (Hydrocodone-Acetaminophen) .Marland Kitchen.. 1 Tab Every 6 Hours As Needed Pain. 16)  Vitamin C 1000 Mg Tabs (Ascorbic Acid) .... Take 1 Tablet By Mouth Once A Day 17)  Omega 3-6-9 (1000mg ) .... Take One Tab By Mouth Daily 18)  Epidrin 325-65-100 Mg Caps (Apap-Isometheptene-Dichloral) .... Take One Every 6 Hours As Needed 19)  Ventolin Hfa 108 (90 Base) Mcg/act Aers (Albuterol Sulfate) .... As Needed For Asthma Attack 20)  Citrucel 500 Mg Tabs (Methylcellulose (Laxative)) .... Take One Tab Twice A Day Every Other Day. 21)  Pepcid Ac 10 Mg  Tabs (Famotidine) .... One Tablet By Mouth Two Times A Day  Allergies (verified): 1)  ! Septra Ds (Sulfamethoxazole-Trimethoprim) 2)  ! Voltaren 3)  ! Flagyl 4)  * Bextra 5)  Lodine 6)  Detrol La (Tolterodine Tartrate) 7)  * Birth Control  Past History:  Past Medical History: Allergic rhinitis Asthma GERD chroinic pain back and legs- OA spine  morbid obesity retinitis pigmentosa, blind 08 MRSA r leg  ovarian cyst  HTN (3/10) cataracts  IBS  urol-- Humphries   Past Surgical History: Had surgeries last month for ruptured hernia repair after fall and then two more surgeries within as many weeks for complications.- May 2008  appy 08 Cholecystectomy 06/99 C-Section Umbilical hernia repair  06/99 hernia sx 08  Bladder surgery- age 27 (dilation?) Endometrial bx. negative 07/00 Endo negative 02/02 cystoscopy neg 3/10 meniscal tear in L knee cataract removal L 12/10  Family History: Father: Deceased Mother: Depression, thyroid (general mental illness), ? HTN  distant maternal family- lots of high blood pressure  Siblings: 2 brothers, one with depression, injuries secondary MVA, one deceased CV- father's side- grandparents and aunts /uncles  HBP- MGM DM- Mat. uncle Pcousins - with cancer (? what type) no colon cancer   Social History: Marital Status: Married Children: 1 Occupation:  non smoker  vision impaired  works for [employer]    Review of Systems       Pertinent positive and negative review of systems were noted in the above HPI and GI specific review of systems.  All other review of systems was otherwise negative.   Vital Signs:  Patient profile:   50 year old female Height:      64 inches Weight:      315 pounds BMI:     54.27 BSA:     2.37 Pulse rate:   88 / minute Pulse rhythm:   regular BP sitting:   128 / 68  (left arm) Cuff size:   large  Vitals Entered By: Ok Anis CMA (September 26, 2009 2:12 PM)  Physical Exam  Additional Exam:  Constitutional: blind, morbidly obese, otherwise well-appearing Psychiatric: alert and oriented times 3 Eyes: extraocular movements intact Mouth: oropharynx moist, no lesions Neck: supple, no lymphadenopathy Cardiovascular: heart regular rate and rythm Lungs: CTA bilaterally Abdomen: soft, non-tender, non-distended, no obvious ascites, no peritoneal signs, normal bowel sounds Extremities: no lower extremity edema bilaterally Skin: no lesions on visible extremities    Impression & Recommendations:  Problem # 1:  intermittent loose stools, intermittent constipation she probably has alternating IBS. At its worst she has 6-7 loose stools a day which is a bit more than usual for IBS. We will  proceed with colonoscopy at her soonest convenience. She will skip a prescription of labs including a CBC, profile, thyroid testing.  Problem # 2:  dyspepsia belching, bloating. Her obesity possibly contributes. Recommended she change the way she is taking her Nexium and we will proceed with EGD to rule out peptic ulcer disease, significant gastritis or GERD. She takes NSAIDs daily and this can contribute to discomforts it sounds like she needs them for her orthopedic pains.  Other Orders: Colon/Endo Hospital (Col/End Hosp) TLB-TSH (Thyroid Stimulating Hormone) (84443-TSH) TLB-Sedimentation Rate (ESR) (85652-ESR) TLB-CBC Platelet - w/Differential (85025-CBCD) TLB-CMP (Comprehensive Metabolic Pnl) (80053-COMP)  Patient Instructions: 1)  You should change the way you are taking your antiacid medicine (nexium) so that you are taking it 20-30 minutes prior to a decent meal as that is the  way the pill is designed to work most effectively. 2)  You will be scheduled to have an upper endoscopy. 3)  You will be scheduled to have a colonoscopy. 4)  These will be done a [facility] with propofol sedation. 5)  Cut back on ibuprofen.  Tylenol is safer. 6)  You will get lab test(s) done today (esr, tsh, cmet, cbc). 7)  The medication list was reviewed and reconciled.  All changed / newly prescribed medications were explained.  A complete medication list was provided to the patient / caregiver. Prescriptions: MOVIPREP 100 GM  SOLR (PEG-KCL-NACL-NASULF-NA ASC-C) As per prep instructions.  #1 x 0   Entered by:   Chales Abrahams CMA (AAMA)   Authorized by:   Rachael Fee MD   Signed by:   Rachael Fee MD on 09/26/2009   Method used:   Electronically to        [facility] [phone]* (retail)       [address] [place], [place]  [address]       Ph: [phone]       Fax: [phone]   RxID:   [id number]

## 2010-02-17 NOTE — Procedures (Signed)
Summary: Colonoscopy  Patient: Monica Madden Note: All result statuses are Final unless otherwise noted.  Tests: (1) Colonoscopy (COL)   COL Colonoscopy           DONE     [facility]     [address] [place], [place]  [address]           COLONOSCOPY PROCEDURE REPORT           PATIENT:  Monica Madden, Monica Madden  MR#:  [id number]     BIRTHDATE:  May 15, 1960, 49 yrs. old  GENDER:  female     ENDOSCOPIST:  Rachael Fee, MD     REF. BY:  Marne A. Milinda Antis, M.D.     PROCEDURE DATE:  10/02/2009     PROCEDURE:  Colonoscopy with biopsy     ASA CLASS:  Class III     INDICATIONS:  chronic loose stools, slightly elevated sed rate     MEDICATIONS:   Fentanyl 150 mcg IV, Versed 15 mg IV, Benadryl 25     mg IV           DESCRIPTION OF PROCEDURE:   After the risks benefits and     alternatives of the procedure were thoroughly explained, informed     consent was obtained.  Digital rectal exam was performed and     revealed no rectal masses.   The Pentax Colonoscope [id number]     endoscope was introduced through the anus and advanced to the     terminal ileum which was intubated for a short distance, without     limitations.  The quality of the prep was good, using MoviPrep.     The instrument was then slowly withdrawn as the colon was fully     examined.     <<PROCEDUREIMAGES>>           FINDINGS:  There were 3-4 small, clean based ulcers in terminal     ileum. The surrounding mucosal was normal appearing. Biopsies were     taken and sent to pathlogy (jar 1) (see image3).  This was     otherwise a normal examination of the colon. The colonic mucosa     was randomly biopsied to check for microscopic colitis (jar 2)     (see image1 and image4).   Retroflexed views in the rectum     revealed no abnormalities.    The scope was then withdrawn from     the patient and the procedure completed.     COMPLICATIONS:  None           ENDOSCOPIC IMPRESSION:     1) A few small, clear based ulcers  in terminal ileum. Biopsied.     IBD vs. NSAIDs (was taking advil 2/day)     2) Otherwise normal examination.  Normal colonic mucosa biopsied     to check for microscopic colitis.           RECOMMENDATIONS:     Await final pathology.     For now, she will start one immodium every morning on a     scheduled basis and only hold if she becomes constipated.           REPEAT EXAM:  10 years for routine cancer screening           ______________________________     Rachael Fee, MD           n.  eSIGNED:   Rachael Fee at 10/02/2009 10:03 AM           [name], [id number]  Note: An exclamation mark (!) indicates a result that was not dispersed into the flowsheet. Document Creation Date: 10/02/2009 10:05 AM _______________________________________________________________________  (1) Order result status: Final Collection or observation date-time: 10/02/2009 09:47 Requested date-time:  Receipt date-time:  Reported date-time:  Referring Physician:   Ordering Physician: Rob Bunting [phone]) Specimen Source:  Source: Launa Grill Order Number: [id number] Lab site:   Appended Document: Colonoscopy patty, she needs recall colon in 10 years  Appended Document: Colonoscopy    Clinical Lists Changes  Observations: Added new observation of COLONNXTDUE: 09/2019 (10/02/2009 11:15)      Appended Document: Colonoscopy recall in IDX and EMR

## 2010-02-17 NOTE — Progress Notes (Signed)
Summary: xanax  Phone Note Refill Request Message from:  Fax from Pharmacy  Refills Requested: Medication #1:  XANAX 0.5 MG TABS Take 1/2 - 1 tablet by mouth twice a day as needed   Last Refilled: 03/11/2009 Faxed request from [facility], [phone].  Initial call taken by: Lowella Petties CMA,  April 08, 2009 10:49 AM  Follow-up for Phone Call        px written on EMR for call in  Follow-up by: Judith Part MD,  April 08, 2009 11:25 AM  Additional Follow-up for Phone Call Additional follow up Details #1::        rx called to [facility] pharmacy Additional Follow-up by: Benny Lennert CMA Duncan Dull),  April 08, 2009 3:24 PM    New/Updated Medications: XANAX 0.5 MG TABS (ALPRAZOLAM) Take 1/2 - 1 tablet by mouth twice a day as needed Prescriptions: XANAX 0.5 MG TABS (ALPRAZOLAM) Take 1/2 - 1 tablet by mouth twice a day as needed  #60 x 3   Entered and Authorized by:   Judith Part MD   Signed by:   Benny Lennert CMA (AAMA) on 04/08/2009   Method used:   Telephoned to ...         RxID:   [id number]

## 2010-02-17 NOTE — Progress Notes (Signed)
Summary: List of Patient Meds & Concerns  List of Patient Meds & Concerns   Imported By: Lanelle Bal 08/13/2009 12:14:40  _____________________________________________________________________  External Attachment:    Type:   Image     Comment:   External Document

## 2010-02-17 NOTE — Assessment & Plan Note (Signed)
Summary: PAIN IN STOMACH & FREQUENT BOWEL MOVEMENTS / LFW   Vital Signs:  Patient profile:   50 year old female Height:      64 inches Weight:      322.25 pounds BMI:     55.51 Temp:     98.8 degrees F oral Pulse rate:   84 / minute Pulse rhythm:   regular BP sitting:   130 / 80  (left arm) Cuff size:   large  Vitals Entered By: Lewanda Rife LPN (August 07, 2009 11:22 AM) CC: Pin in lower abdomen with alot of flatus, frequent BM's every day and also has alot of belching, Frequent urination with leakage of urine and urine smells strong.   History of Present Illness: here with several complaints- low abd pain with stool change and also some urinary changes   wt is up 4 lb  has hx of GERD -- nexium handles that  rarely has a breakthrough symptom- on the more stressful days    has hx of IBS in fall going between constipated and diarrhea citrucel was very helpful in the beginning  now is having very frequent 3-5 times per day / sometimes small  loose but not watery as a rule (better the days she takes vicodin) cannot tell if blood in stool -- because she cannot see well enough  vesicare helps the incontinence of urin - but it makes her stomach hurt cannot take every day still has bad stress incontinence urine smells really strong all the time  drinks water all day    is taking some ibuprofen lately   on vicodin for meniscus tears and needs some surgery -- she is putting this off is getting cortisone shots from Dr Gerrit Heck also-of limited help    hx of ccy and also appy and several hernia repairs in the past   is very stressed - her company is merging with another  will be laid off in the future --  ? 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 Septra Ds (Sulfamethoxazole-Trimethoprim) 2)  ! Voltaren 3)  ! Flagyl 4)  *  Bextra 5)  Lodine 6)  Detrol La (Tolterodine Tartrate) 7)  * Birth Control  Past History:  Past Medical History: Last updated: 01/15/2009 Allergic rhinitis Asthma GERD chroinic pain back and legs- OA spine  morbid obesity retinitis pigmentosa 08 MRSA r leg  ovarian cyst  HTN (3/10) cataracts  IBS  urol-- Humphries   Past Surgical History: Last updated: 01/15/2009 Had surgeries last month for ruptured hernia repair after fall and then two more surgeries within as many weeks for complications.- May 2008 appy 08 Cholecystectomy 06/99 C-Section Umbilical hernia repair 06/99 hernia sx 08  Bladder surgery- age 59 (dilation?) Endometrial bx. negative 07/00 Endo negative 02/02 cystoscopy neg 3/10 meniscal tear in L knee cataract removal L 12/10  Family History: Last updated: 04/01/08 Father: Deceased Mother: Depression, thyroid (general mental illness), ? HTN  distant maternal family- lots of high blood pressure  Siblings: 2 brothers, one with depression, injuries secondary MVA, one deceased CV- father's side- grandparents and aunts /uncles  HBP- MGM DM- Mat. uncle Pcousins - with cancer (? what type)  Social History: Last updated: 08/07/2009 Marital Status: Married Children: 1 Occupation:  non smoker  vision impaired  works for [employer]   Risk Factors: Smoking Status: never (07/15/2006)  Social History: Marital Status: Married Children: 1 Occupation:  non smoker  vision impaired  works for [employer]   Review of Systems General:  Complains of fatigue; denies chills, fever, loss of appetite, and malaise. Eyes:  Denies blurring and eye irritation. CV:  Denies chest pain or discomfort, lightheadness, and palpitations. Resp:  Denies cough, shortness of breath, and wheezing. GI:  Complains of constipation, diarrhea, and indigestion; denies bloody stools, dark tarry stools, loss of appetite, nausea, and vomiting. GU:  Complains of  incontinence; denies dysuria and hematuria. MS:  Complains of joint pain; denies joint redness and joint swelling. Derm:  Denies itching, lesion(s), poor wound healing, and rash. Neuro:  Denies numbness and tingling. Psych:  Complains of anxiety; very stressed - all involving job. Endo:  Denies cold intolerance, excessive thirst, excessive urination, and heat intolerance. Heme:  Denies abnormal bruising and bleeding.  Physical Exam  General:  morbidly obese and well  pt is blind  Head:  normocephalic, atraumatic, and no abnormalities observed.   Eyes:  pupils equal, pupils round, and pupils reactive to light.  poor vsion- is legally blind  Mouth:  pharynx pink and moist.   Neck:  supple with full rom and no masses or thyromegally, no JVD or carotid bruit  Chest Wall:  No deformities, masses, or tenderness noted. Lungs:  Normal respiratory effort, chest expands symmetrically. Lungs are clear to auscultation, no crackles or wheezes. Heart:  Normal rate and regular rhythm. S1 and S2 normal without gallop, murmur, click, rub or other extra sounds. Abdomen:  Bowel sounds positive,abdomen soft and non-tender without masses, organomegaly or hernias noted. large pannus  no suprapubic tenderness or fullness felt  Msk:  poor rom bilat knees  labored gait  Extremities:  trace pedal edema Neurologic:  sensation intact to light touch, gait normal, and DTRs symmetrical and normal.   Skin:  Intact without suspicious lesions or rashes Cervical Nodes:  No lymphadenopathy noted Psych:  is tearful at times when discussing her stress at work    Impression & Recommendations:  Problem # 1:  IRRITABLE BOWEL SYNDROME (ICD-564.1) Assessment Deteriorated with primarily diarrhea in time of stress  some help with fiber pt req GI consult - will refer  Orders: Gastroenterology Referral (GI) Prescription Created Electronically [phone])  Problem # 2:  ANXIETY DEPRESSION (ICD-300.4) Assessment:  Deteriorated  this is worse but will likely imp when she stops working refilled xanax with inc dose- disc pot for habituation and tolerance will get her off it after job is over in sept disc stresors and coping skills today  Orders: Prescription Created Electronically [phone])  Problem # 3:  FREQUENCY, URINARY (ICD-788.41) Assessment: Deteriorated  neg urine today  has overactive bladder - in part mechanical from large pannus- and she has disc this with urol in pasb  she may consider lap band for wt loss - will let me know  Her updated medication list for this problem includes:    Vesicare 5 Mg Tabs (Solifenacin succinate) .Marland Kitchen... 1 by mouth every other day for bladder  Orders: Prescription Created Electronically [phone])  Problem # 4:  KNEE PAIN, BILATERAL (ICD-719.46) Assessment: New  from meniscal tears - seeing Dr Cranford Mon  after done with this hydrocodone px will transfer that to me - and I agreed pt signed narcotics contract  Her updated medication list for this problem includes:    Flexeril 10 Mg Tabs (Cyclobenzaprine  hcl) ..... 1 by mouth up to three times a day as needed back spasm careful of sedation    Vicodin 5-500 Mg Tabs (Hydrocodone-acetaminophen) .Marland Kitchen... 1 tab every 6 hours as needed pain.  Orders: Prescription Created Electronically [phone])  Complete Medication List: 1)  Alprazolam 1 Mg Tabs (Alprazolam) .Marland Kitchen.. 1 by mouth up to three times a day as needed  warn- may sedate 2)  Flonase 50 Mcg/act Susp (Fluticasone propionate) .... 2 puffs in each nostril daily 3)  Advair Diskus 100-50 Mcg/dose Misc (Fluticasone-salmeterol) .Marland Kitchen.. 1 puff twice a day. 4)  Zoloft 50 Mg Tabs (Sertraline hcl) .... 1/2 tab daily by mouth 5)  Allegra 180 Mg Tabs (Fexofenadine hcl) .Marland Kitchen.. 1 tab daily 6)  Singulair 10 Mg Tabs (Montelukast sodium) .Marland Kitchen.. 1 tab daily 7)  Nexium 40 Mg Cpdr (Esomeprazole magnesium) .Marland Kitchen.. 1 by mouth once daily once daily in am 8)  One-daily Multivitamins Tabs (Multiple  vitamin) .... Take one tablet by mouth every other day 9)  Calcium  .... Take two  by mouth daily 10)  Flax Seed Oil 1000 Mg Caps (Flaxseed (linseed)) .... Take 2 by mouth daily 11)  B Complex 100 Tabs (B complex vitamins) .... Take one tablet by mouth every other day 12)  Vesicare 5 Mg Tabs (Solifenacin succinate) .Marland Kitchen.. 1 by mouth every other day for bladder 13)  Lisinopril 5 Mg Tabs (Lisinopril) .Marland Kitchen.. 1 by mouth once daily in am 14)  Nystatin 100000 Unit/ml Susp (Nystatin) .Marland Kitchen.. 1 teaspoon three times a day swish and swallow until mouth is clear as needed 15)  Flexeril 10 Mg Tabs (Cyclobenzaprine hcl) .Marland Kitchen.. 1 by mouth up to three times a day as needed back spasm careful of sedation 16)  Vicodin 5-500 Mg Tabs (Hydrocodone-acetaminophen) .Marland Kitchen.. 1 tab every 6 hours as needed pain. 17)  Vitamin C 1000 Mg Tabs (Ascorbic acid) .... Take 1 tablet by mouth once a day 18)  Omega 3-6-9 (1000mg )  .... Take one tab by mouth  twice a day 19)  Epidrin 325-65-100 Mg Caps (Apap-isometheptene-dichloral) .... Take one every 6 hours as needed 20)  Ventolin Hfa 108 (90 Base) Mcg/act Aers (Albuterol sulfate) .... As needed for asthma attack 21)  Citrucel 500 Mg Tabs (Methylcellulose (laxative)) .... Take one tab twice a day every other day.  Patient Instructions: 1)  we will do ref to GI at check out  2)  use the xanax with caution- I am increasing med dose -- will try to get off this when you are done with your job  3)  follow up for your physical as planned  4)  here is paper px on for xanax  5)  I sent the lisinopril to your pharmacy electronically  Prescriptions: LISINOPRIL 5 MG TABS (LISINOPRIL) 1 by mouth once daily in am  #30 x 11   Entered and Authorized by:   Judith Part MD   Signed by:   Judith Part MD on 08/07/2009   Method used:   Electronically to        [facility] [phone]* (retail)       [address] [place], [place]  [address]       Ph: [phone]       Fax: [phone]    RxID:   [id number] ALPRAZOLAM 1 MG TABS (ALPRAZOLAM) 1 by mouth up to three times a day as needed  warn- may sedate  #60 x 0   Entered  and Authorized by:   Judith Part MD   Signed by:   Judith Part MD on 08/07/2009   Method used:   Print then Give to Patient   RxID:   [id number]   Current Allergies (reviewed today): ! SEPTRA DS (SULFAMETHOXAZOLE-TRIMETHOPRIM) ! VOLTAREN ! FLAGYL * BEXTRA LODINE DETROL LA (TOLTERODINE TARTRATE) * BIRTH CONTROL  Laboratory Results   Urine Tests    Routine Urinalysis   Color: yellow Appearance: Clear Glucose: negative   (Normal Range: Negative) Bilirubin: negative   (Normal Range: Negative) Ketone: negative   (Normal Range: Negative) Spec. Gravity: 1.010   (Normal Range: 1.003-1.035) Blood: trace-intact   (Normal Range: Negative) pH: 7.0   (Normal Range: 5.0-8.0) Protein: trace   (Normal Range: Negative) Urobilinogen: 0.2   (Normal Range: 0-1) Nitrite: negative   (Normal Range: Negative) Leukocyte Esterace: negative   (Normal Range: Negative)  Urine Microscopic WBC/hpf: 0 RBC/hpf: 0 Bacteria: 0 Mucous: few Epithelial: 0 Crystals/LPF: 0 Casts/LPF: 0 Yeast/HPF: 0 Other: 0

## 2010-02-17 NOTE — Assessment & Plan Note (Signed)
Summary: BACK PAIN PER DR TOWER/RI   Vital Signs:  Patient profile:   50 year old female Height:      64 inches Weight:      318 pounds BMI:     54.78 Temp:     98.3 degrees F oral Pulse rate:   84 / minute Pulse rhythm:   regular BP sitting:   104 / 78  (left arm) Cuff size:   large  Vitals Entered By: Delilah Shan CMA Duncan Dull) (May 21, 2009 9:34 AM) CC: Back pain   History of Present Illness: 68 ho here for back pain.  Was in the shower and Monday, bent down to wash her feet and has had excruciating bilateral back pain. No radiculopathy, no LE weakness. Dr. Milinda Antis called in some Flexeril for her yesterday which is helping a little bit. Pain is the worst when she changes positions.    Current Medications (verified): 1)  Xanax 0.5 Mg Tabs (Alprazolam) .... Take 1/2 - 1 Tablet By Mouth Twice A Day As Needed 2)  Flonase 50 Mcg/act  Susp (Fluticasone Propionate) .... 2 Puffs in Each Nostril Daily 3)  Advair Diskus 100-50 Mcg/dose  Misc (Fluticasone-Salmeterol) .... 21 Puff Twice A Day. 4)  Zoloft 50 Mg  Tabs (Sertraline Hcl) .... 1/2 Tab Daily By Mouth 5)  Allegra 180 Mg  Tabs (Fexofenadine Hcl) .Marland Kitchen.. 1 Tab Daily 6)  Singulair 10 Mg  Tabs (Montelukast Sodium) .Marland Kitchen.. 1 Tab Daily 7)  Nexium 40 Mg  Cpdr (Esomeprazole Magnesium) .Marland Kitchen.. 1 By Mouth Once Daily Once Daily in Am 8)  One-Daily Multivitamins   Tabs (Multiple Vitamin) .... Take One Tablet By Mouth Every Other Day 9)  Calcium .... Take One By Mouth Daily 10)  Vitamin C .... Take One By Mouth Daily 11)  Midrin 325-65-100 Mg  Caps (Apap-Isometheptene-Dichloral) .Marland Kitchen.. 1 By Mouth Q 6 Hours As Needed Migraine 12)  Flax Seed Oil 1000 Mg  Caps (Flaxseed (Linseed)) .... Take 2 By Mouth Daily 13)  B Complex 100  Tabs (B Complex Vitamins) .... Take One Tablet By Mouth Every Other Day 14)  Vesicare 5 Mg Tabs (Solifenacin Succinate) .Marland Kitchen.. 1 By Mouth Every Other Day For Bladder 15)  Lisinopril 5 Mg Tabs (Lisinopril) .Marland Kitchen.. 1 By Mouth Once Daily in  Am 16)  Nystatin 100000 Unit/ml Susp (Nystatin) .Marland Kitchen.. 1 Teaspoon Three Times A Day Swish and Swallow Until Mouth Is Clear 17)  Flexeril 10 Mg Tabs (Cyclobenzaprine Hcl) .Marland Kitchen.. 1 By Mouth Up To Three Times A Day As Needed Back Spasm Careful of Sedation 18)  Vicodin 5-500 Mg Tabs (Hydrocodone-Acetaminophen) .Marland Kitchen.. 1 Tab Every 6 Hours As Needed Pain.  Allergies: 1)  ! Septra Ds (Sulfamethoxazole-Trimethoprim) 2)  ! Voltaren 3)  ! Flagyl 4)  * Bextra 5)  Lodine 6)  Detrol La (Tolterodine Tartrate) 7)  * Birth Control  Review of Systems      See HPI GU:  Denies dysuria, incontinence, urinary frequency, and urinary hesitancy. MS:  Complains of low back pain; denies loss of strength.  Physical Exam  General:  morbidly obese and well appearing Msk:  SLR neg. Obvious large back spasm over entire lumbar region, bilateral Normal strength, normal sensation Neurologic:  alert & oriented X3.   Psych:  normal affect, talkative and pleasant    Impression & Recommendations:  Problem # 1:  MUSCLE SPASM, LUMBAR REGION (ICD-724.8) Assessment New Continue flexeril. Discussed back spasm (see pt instructions). Given short course of Vicodin. Her updated medication  list for this problem includes:    Flexeril 10 Mg Tabs (Cyclobenzaprine hcl) .Marland Kitchen... 1 by mouth up to three times a day as needed back spasm careful of sedation    Vicodin 5-500 Mg Tabs (Hydrocodone-acetaminophen) .Marland Kitchen... 1 tab every 6 hours as needed pain.  Complete Medication List: 1)  Xanax 0.5 Mg Tabs (Alprazolam) .... Take 1/2 - 1 tablet by mouth twice a day as needed 2)  Flonase 50 Mcg/act Susp (Fluticasone propionate) .... 2 puffs in each nostril daily 3)  Advair Diskus 100-50 Mcg/dose Misc (Fluticasone-salmeterol) .... 21 puff twice a day. 4)  Zoloft 50 Mg Tabs (Sertraline hcl) .... 1/2 tab daily by mouth 5)  Allegra 180 Mg Tabs (Fexofenadine hcl) .Marland Kitchen.. 1 tab daily 6)  Singulair 10 Mg Tabs (Montelukast sodium) .Marland Kitchen.. 1 tab daily 7)   Nexium 40 Mg Cpdr (Esomeprazole magnesium) .Marland Kitchen.. 1 by mouth once daily once daily in am 8)  One-daily Multivitamins Tabs (Multiple vitamin) .... Take one tablet by mouth every other day 9)  Calcium  .... Take one by mouth daily 10)  Vitamin C  .... Take one by mouth daily 11)  Midrin 325-65-100 Mg Caps (Apap-isometheptene-dichloral) .Marland Kitchen.. 1 by mouth q 6 hours as needed migraine 12)  Flax Seed Oil 1000 Mg Caps (Flaxseed (linseed)) .... Take 2 by mouth daily 13)  B Complex 100 Tabs (B complex vitamins) .... Take one tablet by mouth every other day 14)  Vesicare 5 Mg Tabs (Solifenacin succinate) .Marland Kitchen.. 1 by mouth every other day for bladder 15)  Lisinopril 5 Mg Tabs (Lisinopril) .Marland Kitchen.. 1 by mouth once daily in am 16)  Nystatin 100000 Unit/ml Susp (Nystatin) .Marland Kitchen.. 1 teaspoon three times a day swish and swallow until mouth is clear 17)  Flexeril 10 Mg Tabs (Cyclobenzaprine hcl) .Marland Kitchen.. 1 by mouth up to three times a day as needed back spasm careful of sedation 18)  Vicodin 5-500 Mg Tabs (Hydrocodone-acetaminophen) .Marland Kitchen.. 1 tab every 6 hours as needed pain.  Patient Instructions: 1)  Most patients (90%) with low back pain will improve with time ( 2-6 weeks). Keep active but avoid activities that are painful. Apply moist heat and/or ice to lower back several times a day.  Prescriptions: VICODIN 5-500 MG TABS (HYDROCODONE-ACETAMINOPHEN) 1 tab every 6 hours as needed pain.  #30 x 0   Entered and Authorized by:   Ruthe Mannan MD   Signed by:   Ruthe Mannan MD on 05/21/2009   Method used:   Print then Give to Patient   RxID:   [id number]   Current Allergies (reviewed today): ! SEPTRA DS (SULFAMETHOXAZOLE-TRIMETHOPRIM) ! VOLTAREN ! FLAGYL * BEXTRA LODINE DETROL LA (TOLTERODINE TARTRATE) * BIRTH CONTROL

## 2010-02-17 NOTE — Progress Notes (Signed)
Summary: Rx Hydrocodone  Phone Note Refill Request Call back at [phone] Message from:  [facility] on Jun 02, 2009 1:00 PM  Refills Requested: Medication #1:  VICODIN 5-500 MG TABS 1 tab every 6 hours as needed pain..   Last Refilled: 05/21/2009 Received faxed refill request please advise.   Method Requested: Telephone to Pharmacy Initial call taken by: Linde Gillis CMA Duncan Dull),  Jun 02, 2009 1:01 PM  Follow-up for Phone Call        denied.  saw pt for acute visit.  should not need refills. Ruthe Mannan MD  Jun 02, 2009 1:48 PM  Advised pharmacist at [facility] as instructed, she said she will notify patient. Follow-up by: Linde Gillis CMA Duncan Dull),  Jun 02, 2009 2:12 PM

## 2010-02-17 NOTE — Procedures (Signed)
Summary: Upper Endoscopy  Patient: [name] Note: All result statuses are Final unless otherwise noted.  Tests: (1) Upper Endoscopy (EGD)   EGD Upper Endoscopy       DONE     [facility]     [address] [place], [place]  [address]           ENDOSCOPY PROCEDURE REPORT           PATIENT:  Monica Madden, Monica Madden  MR#:  [id number]     BIRTHDATE:  08-16-1960, 49 yrs. old  GENDER:  female     ENDOSCOPIST:  Rachael Fee, MD     Referred by:  Marne A. Milinda Antis, M.D.     PROCEDURE DATE:  10/02/2009     PROCEDURE:  EGD, diagnostic     ASA CLASS:  Class III     INDICATIONS:  dyspepsia, burning     MEDICATIONS:   There was residual sedation effect present from     prior procedure.     TOPICAL ANESTHETIC:  none     DESCRIPTION OF PROCEDURE:   After the risks benefits and     alternatives of the procedure were thoroughly explained, informed     consent was obtained.  The Pentax Gastroscope [id number] endoscope     was introduced through the mouth and advanced to the second     portion of the duodenum, without limitations.  The instrument was     slowly withdrawn as the mucosa was fully examined.     <<PROCEDUREIMAGES>>     The upper, middle, and distal third of the esophagus were     carefully inspected and no abnormalities were noted. The z-line     was well seen at the GEJ. The endoscope was pushed into the fundus     which was normal including a retroflexed view. The antrum,gastric     body, first and second part of the duodenum were unremarkable (see     image1, image2, image3, image5, image6, and image7).     Retroflexed views revealed no abnormalities.    The scope was then     withdrawn from the patient and the procedure completed.           COMPLICATIONS:  None           ENDOSCOPIC IMPRESSION:     1) Normal EGD;  her symptoms may be NSAID (advil) related, her     size probably contributes to GI discomfort.           RECOMMENDATIONS:     She is avoiding NSAIDs and  has changed the timing of PPI. Will     see how those changes help her dyspeptic symptoms.     Dr. Christella Hartigan office will arrange return visit in 5-6 weeks.           ______________________________     Rachael Fee, MD           n.     eSIGNED:   Rachael Fee at 10/02/2009 10:10 AM           [name], [phone]  Note: An exclamation mark (!) indicates a result that was not dispersed into the flowsheet. Document Creation Date: 10/02/2009 10:11 AM _______________________________________________________________________  (1) Order result status: Final Collection or observation date-time: 10/02/2009 09:58 Requested date-time:  Receipt date-time:  Reported date-time:  Referring Physician:   Ordering Physician: Rob Bunting [phone]) Specimen Source:  Source: Launa Grill  Order Number: [id number] Lab site:   Appended Document: Upper Endoscopy needs rov in 5-6 weeks  Appended Document: Upper Endoscopy pt scheduled for ROV letter mailed

## 2010-02-19 NOTE — Progress Notes (Signed)
Summary: alprazolam   Phone Note Refill Request Message from:  Fax from Pharmacy on February 06, 2010 10:08 AM  Refills Requested: Medication #1:  ALPRAZOLAM 1 MG TABS 1 by mouth up to three times a day as needed  warn- may sedate   Last Refilled: 01/07/2010 Refill request from [facility] pharmacy. [phone]  Initial call taken by: Melody Comas,  February 06, 2010 10:08 AM  Follow-up for Phone Call        px written on EMR for call in  Follow-up by: Judith Part MD,  February 06, 2010 11:19 AM  Additional Follow-up for Phone Call Additional follow up Details #1::        Rx called in as directed.  Additional Follow-up by: Janee Morn CMA Duncan Dull),  February 06, 2010 2:51 PM    New/Updated Medications: ALPRAZOLAM 1 MG TABS (ALPRAZOLAM) 1 by mouth up to three times a day as needed  warn- may sedate Prescriptions: ALPRAZOLAM 1 MG TABS (ALPRAZOLAM) 1 by mouth up to three times a day as needed  warn- may sedate  #60 x 3   Entered and Authorized by:   Judith Part MD   Signed by:   Selena Batten Dance CMA (AAMA) on 02/06/2010   Method used:   Telephoned to ...       [facility] [address] [phone]* (retail)       [address] [place], [place]  [address]       Ph: [phone]       Fax: [phone]   RxID:   [id number]

## 2010-03-09 ENCOUNTER — Telehealth: Payer: Self-pay | Admitting: Family Medicine

## 2010-03-17 NOTE — Progress Notes (Signed)
Summary: alprazolam  Phone Note Refill Request Message from:  Fax from Pharmacy on March 09, 2010 1:21 PM  Refills Requested: Medication #1:  ALPRAZOLAM 1 MG TABS 1 by mouth up to three times a day as needed  warn- may sedate   Last Refilled: 02/06/2010 Refill request from [facility]. [phone]  Initial call taken by: Melody Comas,  March 09, 2010 1:21 PM  Follow-up for Phone Call        looks like she got 70 with 3 refils a month ago?  Follow-up by: Judith Part MD,  March 09, 2010 2:01 PM  Additional Follow-up for Phone Call Additional follow up Details #1::        Spoke to pharmacy and advised them of this. Was informed by Morrie Sheldon at the pharmacy that they failed to put down any refills but that she will make the correction at this time. Additional Follow-up by: Sydell Axon LPN,  March 09, 2010 3:06 PM

## 2010-06-02 NOTE — Assessment & Plan Note (Signed)
NAME:  Monica Madden, BACHICHA NO.:  [id number]   MEDICAL RECORD NO.:  [id number]          PATIENT TYPE:  POB   LOCATION:  [facility]         FACILITY:  [facility]   PHYSICIAN:  Tinnie Gens, MD        DATE OF BIRTH:  Nov 28, 1960   DATE OF SERVICE:  07/11/2007                                  CLINIC NOTE   CHIEF COMPLAINT:  Follow up results.   HISTORY OF PRESENT ILLNESS:  The patient is a 50 year old gravida 1,  para 1 who was previously seen for menorrhagia and dysmenorrhea.  The  patient was here previously and underwent Pap smear and endometrial  sampling, results are reviewed, and at this time Pap smear shows a  normal Pap.  Endometrial biopsy shows a benign endometrial polyp with no  hyperplasia or malignancy identified.   PHYSICAL EXAMINATION:  Vitals as are on the chart.  She is a well-  developed, well-nourished female in no acute distress.  Abdomen soft,  nontender, nondistended.   IMPRESSION:  1. Menorrhagia.  2. Endometrial polyp.   PLAN:  Endometrial ablation.  We will do hydrothermablation.  We will  try to schedule her for early August at the latest.           ______________________________  Tinnie Gens, MD     TP/MEDQ  D:  07/11/2007  T:  07/12/2007  Job:  [id number]

## 2010-06-02 NOTE — Op Note (Signed)
NAMESHAMIKA, Monica Madden               ACCOUNT NO.:  [id number]   MEDICAL RECORD NO.:  [id number]          PATIENT TYPE:  AMB   LOCATION:  [facility]                           FACILITY:  [facility]   PHYSICIAN:  Tanya S. Shawnie Pons, M.D.   DATE OF BIRTH:  07-13-1960   DATE OF PROCEDURE:  08/29/2007  DATE OF DISCHARGE:  08/29/2007                               OPERATIVE REPORT   PREOPERATIVE DIAGNOSES:  1. Menorrhagia.  2. Endometrial polyp.   POSTOPERATIVE DIAGNOSES:  1. Menorrhagia.  2. Endometrial polyp.   PROCEDURE:  Hydrothermablation.   SURGEON:  Shelbie Proctor. Shawnie Pons, MD.   ASSISTANT:  None.   ANESTHESIA:  MAC and local.   FINDINGS:  Fluffy uterine lining.   SPECIMENS:  None.   ESTIMATED BLOOD LOSS:  Minimal.   COMPLICATIONS:  None.   REASON FOR PROCEDURE:  Briefly, the patient is a 50 year old morbidly  obese female who has a long history of menorrhagia.  She has a very  complicated past surgical history who has had multiple surgeries with a  large ventral hernia and mesh and not a good candidate for definitive  treatment with hysterectomy, but however, is interested in  hydrothermablation.  She has undergone endometrial sampling in the  office, which reveals endometrial polyp.  No evidence of hyperplasia or  endometrial carcinoma.  This was approximately her third or fourth  endometrial sampling, which have all been negative.  Her hemoglobin is  11.1.   PROCEDURE:  The patient was taken to the OR.  She was placed in dorsal  lithotomy in [place]well stirrups.  She was prepped and draped in usual  sterile fashion.  A red rubber catheter was used to drain her bladder.  The speculum was placed in the vagina.  Paracervical block is done with  0.25% Marcaine.  The cervix was dilated sequentially and the  hysteroscopy was performed.  It was very difficult to get the  hydrothermablation with a sheath over the scope into the cervical os, so  the cervix had to be dilated a little bit further than  usual, given the  anteverted nature of the uterus.  On hysteroscopy, a fluffy endometrial  lining was noted.  Both ostia were seen for the tubes.  The fundus was  examined as well.  Hydrothermablation was then performed until adequate  temperature was reached and then 10  minutes of burning at the end of the procedure.  Hysteroscopy was again  done and pictures were taken, which showed a good result.  All  instruments were then removed from the vagina.  All instrument and lap  counts were correct x2.  The patient was awakend and taken to the  recovery room in stable condition.      Shelbie Proctor. Shawnie Pons, M.D.  Electronically Signed     TSP/MEDQ  D:  09/07/2007  T:  09/08/2007  Job:  [id number]

## 2010-06-02 NOTE — Assessment & Plan Note (Signed)
NAMEMACON, LESESNE NO.:  [id number]   MEDICAL RECORD NO.:  [id number]          PATIENT TYPE:  POB   LOCATION:  [facility]         FACILITY:  [facility]   PHYSICIAN:  Catalina Antigua, MD     DATE OF BIRTH:  Jun 13, 1960   DATE OF SERVICE:  11/13/2008                                  CLINIC NOTE   This is a 50 year old para 1 with LMP of November 01, 2008, who presents  for evaluation of urinary symptoms and vaginal burning.  The patient  states that after complaining of a left leg pain, she was seen by a  doctor in September and was diagnosed with 2 meniscus tear.  The patient  was started on a course of cortisone and antiinflammatory which  triggered severe asthma attacks.  The patient had to stop taking the  medication, was started on a course of Solu-Medrol.  The patient states  that on October 15, she started having a heavy period which lasted for  10 days along with severe cramping pain for the first time in 14 months  since her endometrial ablation.  In mid October, the patient also was  complaining of some urinary symptoms, was seen in the [facility]  clinic and started on a course of Cipro which did not alleviate her  symptoms.  She was then started on nitrofurantoin on October 21 and she  only has 1 more day of the antibiotics left.  The patient states that  her urinary symptoms have improved, although she still feels at time  pressure and occasionally some dysuria.  The patient is also complaining  of vaginal burning that she feels occasionally and thinks that she may  have yeast infection in view of all the antibiotics that she has been  taking.   PHYSICAL EXAMINATION:  VITAL SIGNS:  Blood pressure 149/98, pulse of 79,  her height is 5 feet 1 inch.  ABDOMEN:  Soft, nontender, obese.  BACK:  No CVA tenderness.  PELVIC:  She had a normal vaginal mucosa.  No abnormal discharge.  Old  blood was visualized in the vault.  Bimanual exam is limited  secondary  to body habitus, but no palpable adnexal masses or pelvic masses.   ASSESSMENT AND PLAN:  This is a 50 year old para 1 with LMP of November 01, 2008, who presents for evaluation of urinary symptoms and vaginal  burning.  A wet prep was collected.  The patient is not currently  sexually active.  Urine culture was also sent.  The patient was advised  to return to clinic if vaginal bleeding recurred again.  The patient  will be contacted with the results of the wet mount and urine culture.  Otherwise, the patient is to return in July 2011 for annual exam.            ______________________________  Catalina Antigua, MD     PC/MEDQ  D:  11/13/2008  T:  11/14/2008  Job:  [id number]

## 2010-06-02 NOTE — Discharge Summary (Signed)
NAMEBEYZA, BELLINO NO.:  [id number]   MEDICAL RECORD NO.:  [id number]          PATIENT TYPE:  INP   LOCATION:  5715                         FACILITY:  [facility]   PHYSICIAN:  Wilmon Arms. Corliss Skains, M.D. DATE OF BIRTH:  01-09-1961   DATE OF ADMISSION:  06/10/2006  DATE OF DISCHARGE:  06/15/2006                               DISCHARGE SUMMARY   DISCHARGING PHYSICIAN:  Dr. Corliss Skains.   CHIEF COMPLAINT/REASON FOR ADMISSION:  Ms. Corne is a 50 year old  female patient who recently underwent ventral hernia repair secondary to  recurrent incarceration on Jun 03, 2006, by Dr. Lindie Spruce.  She did well  postoperatively, began having BMs the Tuesday after admission, after  being discharged on Monday, but awakened early Wednesday morning with  symptoms consistent with a bowel obstruction, nausea, vomiting, liquid  stools and inability to eat.  She came to the ER where she was found to  have leukocytosis, and a CT scan revealed mid abdominal small-bowel  obstruction.  An NG tube was inserted in the ER and 1800 mL of bilious  returns were noted.  On exam, the patient was diffusely tender without  bowel sounds.  JP drains were in place and sights were unremarkable.  The patient was admitted with a diagnosis of postoperative small bowel  obstruction.   HOSPITAL COURSE:  The patient was admitted to the general floor where  she was placed on n.p.o. status, NG tube for bowel decompression and IV  fluid hydration.  Her potassium was also slightly low and this was  repleted IV.  Her white count decreased to 14,000 within the first day.   Throughout the remainder of the hospitalization, the patient improved  slowly.  She began passing gas by the second day of hospitalization.  She still had difficulty with her potassium being low, so this was  repleted as well IV.  Every other staple was removed on May 25 from the  previous incision.  She remained stable without fevers and by Jun 13, 2006,  her NG tube was discontinued, and she was started on clear liquid  diet.  By May 27, she was started on a full liquid diet and was  otherwise doing well.  The rest of her staples removed and Steri-Strips  were applied.  During the evening of Jun 14, 2006, the patient reports  eating a little too much of her diet and threw up a large volume, but  felt much better after that, and on Jun 15, 2006, had no recurrence of  her symptoms.  Because of the emesis, a two-view abdominal x-ray was  checked on Jun 15, 2006.  This revealed some areas of the small bowel  duct dilatation, several loops in the left upper abdomen without any air-  fluid levels or evidence of obstruction.  The patient was otherwise  deemed appropriate for discharge home.   In addition, during the evening on Jun 14, 2006, the patient's distal  portion of her wound opened up.  There was no fascial dehiscence.  There  was a 5 cm area from the skin surface to  skin surface caudale to distal  that was open, about 1.5 cm deep.  There was an area that tracts upward  under the skin another 4-5 cm.  The wound width is about 2 cm.  This  area has been cleansed of any of fibrin or old blood debris, and  Hydrogel saturated 4x4s have been placed into the wound bed with plans  to change once daily and p.r.n. home health nurse will come follow.  Dressings may need to increase to twice daily if wound bed does not  remain moist.  In addition, the patient's JPs were discontinued and dry  dressings were placed over the sites.  The patient's potassium, on date  of discharge, had normalized to 3.7.   FINAL DISCHARGE DIAGNOSES:  1. Small bowel obstruction, postoperatively resolved.  2. Diarrhea secondary to resolution of small bowel obstruction.  3. Open abdominal wound postoperatively without signs of infection.  4. Hypokalemia resolved.   DISCHARGE MEDICATIONS:  1. The patient will resume medications she was taking prior to      admission.   Please refer to the medication reconciliation sheet.      The patient will also have a copy of this sent home with her.  2. Use Vicodin sparingly.  3. Prefer Tylenol and ibuprofen for pain management.   DISCHARGE INSTRUCTIONS:  1. Return to work no sooner than 5 weeks from today.  This is again      per Dr. Lindie Spruce after he sees you in clinic and releases you      completely.  2. Activity: Increase activity slowly.  May walk up steps.  May      shower.  No lifting for 5 weeks more than 15 pounds.  3. Diet: No roughage, such as meats, lettuce, raw vegetables, fruits      with seeds or skins, peanuts or popcorn for the next 2 weeks.      Prefer you also supplements this low residue diet with Boost milk      three times daily and as needed.  Wound care hydrogel saturated 4x4      packed into the open wound, cover with dry dressing daily and      p.r.n. wound care.  Home health RN to follow.  In addition, if you      have increase drainage from the old JP site i.e. continued large      amounts of clear drainage as prior to discharge, you may use Always      sanitary napkins over these areas to absorb this drainage.   FOLLOWUP:  She needs to call Dr. Dixon Boos office to be seen in 2 weeks.  She had an appointment yesterday, which she obviously missed because she  was in the hospital.      Revonda Standard L. Rondel Jumbo. Tsuei, M.D.  Electronically Signed    ALE/MEDQ  D:  06/15/2006  T:  06/15/2006  Job:  [id number]   cc:   Cherylynn Ridges, M.D.

## 2010-06-02 NOTE — H&P (Signed)
NAMESERENITI, WAN NO.:  [id number]   MEDICAL RECORD NO.:  [id number]          PATIENT TYPE:  INP   LOCATION:  1845                         FACILITY:  [facility]   PHYSICIAN:  Ollen Gross. Vernell Morgans, M.D. DATE OF BIRTH:  07-04-1960   DATE OF ADMISSION:  06/10/2006  DATE OF DISCHARGE:                              HISTORY & PHYSICAL   SURGEON:  Marta Lamas. Lindie Spruce, M.D.   CHIEF COMPLAINT:  Nausea, vomiting, diarrhea.   HISTORY OF PRESENT ILLNESS:  [name] is a 50 year old female patient,  history of recent repair of incarcerated recurrent ventral hernia on Jun 03, 2006, by Dr. Lindie Spruce.  She did relatively well postop and was sent  home at the beginning of the week on Monday with JP drains in place,  tolerating a diet without any difficulty.  She finally had a large BM  sometime Monday evening into Tuesday morning which was formed and was  doing well until early Wednesday a.m. when she abruptly awakened with  abdominal discomfort fullness nausea and vomiting and had developed  liquid stools.  She has been unable to really eat or drink since then  and is having a burning mid abdominal pain with belching, fullness  sensation, occasional flatus and continues with dark liquid stool free.  Her urine is also dark.  She presented to the ER today because of these  symptoms.  Her white count was elevated in the 16,000 range.  She  underwent a CT of the abdomen and pelvis which revealed a mid abdomen  small bowel obstruction with small bowel wall thickening and fluid, no  abscesses or fluid collections in the abdomen or pelvis and no  recurrence of small bowel herniation into the prior ventral hernia site.  The CT was also reviewed with Dr. Carolynne Edouard.  Since our examination, we have  ordered an NG tube and this has been inserted and approximately 1800 mL  of bilious returns have been noted.  The patient is being admitted with  a small bowel obstruction.   REVIEW OF SYSTEMS:  As above.   No fevers no chills.  She does have  reflux and sour taste in the mouth.   SOCIAL HISTORY:  No alcohol.  No tobacco.  She is married.  Husband is  here with her.   PAST MEDICAL HISTORY:  1. Asthma  2. Obesity.  3. Retinitis pigmentosa.   PAST SURGICAL HISTORY:  1. Laparoscopic cholecystectomy with ventral hernia repair and mesh in      1999 by Dr. Jamey Ripa.  2. History of recurrent ventral hernia with small bowel and omental      herniation with repair on Jun 03, 2006, by Dr. Lindie Spruce   ALLERGIES:  NO KNOWN DRUG ALLERGIES.   CURRENT MEDICATIONS:  Advair inhaler, Allegra, Flonase, Nexium,  Singulair and Zoloft.  Please refer to fast H&P as well as medication  reconciliation records for dosages.   PHYSICAL EXAMINATION:  GENERAL:  Pleasant female patient who was sitting  up on the side of the stretcher because this helps her symptoms,  complaining of abdominal pain, bloating  and nausea and vomiting.  VITAL SIGNS: Temperature 98, BP 152/92, pulse 98 and regular,  respirations 22.  NEUROLOGIC:  Patient is alert and oriented x3, moving all extremities  x4.  No focal deficits.  HEENT:  Head normocephalic.  Sclera not injected.  NECK:  Supple.  No adenopathy.  CHEST:  Bilateral lung sounds clear to auscultation.  Respiratory effort  is nonlabored.  CARDIAC:  S1-S2.  No rubs, murmurs, thrills or gallops.  Pulses regular.  No JVD.  ABDOMEN:  Obese, soft, midline staple line is clean, dry and intact  without herniation, drainage, induration or erythema.  She has two JPs  in place in the subcutaneous tissue with serosanguineous drainage, no  purulence noted.  No redness around these insertion sites.  Bowel sounds  are not auscultated.  She is diffusely tender otherwise without any  guarding or rebounding.  EXTREMITIES:  Symmetrical in appearance with trace edema.  Pulses are  palpable.   LABORATORY DATA:  White count 16,500, hemoglobin 12, platelets 528,000,  neutrophils 81%.  Sodium  135, potassium 3.2, glucose 130, CO2 29, BUN  15, creatinine 0.75.  Urinalysis was contaminated with epithelials.  This was a clean-catch specimen.   DIAGNOSTICS:  CT of the abdomen and pelvis as noted.   IMPRESSION:  1. Small bowel obstruction after recent repair of incarcerated ventral      hernia.  2. Nausea and vomiting and associated volume depletion secondary to      small bowel obstruction.  3. Hypokalemia.  4. Asthma which is stable.   PLAN:  1. Admit the patient to the general surgical floor, begin bowel rest,      NG tube decompression, IV fluids with potassium and follow-up two-      view abdominal x-rays in the morning to follow the bowel      obstruction.  2. Will go ahead and repeat a urinalysis, this time with an I&O cathed      specimen and culture to make sure the patient does not have an      underlying urinary tract infection.  Because of recent surgery as      well as the patient's obese size and will need to be on bed rest      with bowel rest, will begin DVT prophylaxis with Lovenox daily.  3. Treat symptoms with morphine, Zofran and Phenergan.  Will continue      PPI as well and use Toradol IV since this worked well for her at      the previous surgery.      Monica Madden, N.POllen Gross. Vernell Morgans, M.D.  Electronically Signed    ALE/MEDQ  D:  06/10/2006  T:  06/10/2006  Job:  [id number]   cc:   Cherylynn Ridges, M.D.

## 2010-06-02 NOTE — Assessment & Plan Note (Signed)
Monica Madden, Monica Madden NO.:  [id number]   MEDICAL RECORD NO.:  [id number]          PATIENT TYPE:  POB   LOCATION:  [facility] at [facility]         FACILITY:  [facility]   PHYSICIAN:  Tinnie Gens, MD        DATE OF BIRTH:  11/27/1960   DATE OF SERVICE:                                  CLINIC NOTE   CHIEF COMPLAINT:  Consult.   HISTORY OF PRESENT ILLNESS:  The patient is a 50 year old gravida 1,  para 1, who was referred from Dr. Roxy Manns for menorrhagia and  dysmenorrhea.  The patient also has a history significant for morbid  obesity and a very complicated surgical history as well.  The patient  previously was seen by Dr. Loraine Maple for her GYN problems but has  changed physicians this year.  Multiple records are reviewed for this  patient from Dr. Milinda Antis as well as some hospital records.  Of note, the  patient had an extensive hernia repair that required several bigger  surgeries and multiple complications.  This was just in the past year,  and there is a question about whether she has a repeat hernia at this  time.  The patient's GYN history is notable for morbid obesity and  abnormal cycles.  She usually has cycles every 28 to 45 days.  She has  had at least one endometrial biopsy by Dr. Loraine Maple, which was  negative in 2000, but she has not had one recently.  She reports that  her last cycle in May was a very normal cycle for her with minimal  bleeding, but her cycle prior to that in April almost caused her to go  to the emergency room secondary to massive hemorrhaging.  The patient  does desire some sort of definitive treatment as she has been dealing  with this for some time.  The patient's CT is reviewed from May 25, 2007,  which reveals a subcutaneous fluid collection anterior to previously  placed abdominal wall mesh that could be a postoperative seroma versus a  clinical abscess.  She has not really had imaging of the pelvis except  for CT, although  she had an MRI done in September 03, 2005, which showed a  normal-sized uterus, thickened endometrial lining, and a simple cyst on  the right ovary.  I am not sure she had any sort of followup for that,  and she may be a good candidate for pelvic sonography now, although on  her CT of the pelvis in May, that says the uterus and ovaries were  grossly unremarkable at that time.   PAST MEDICAL HISTORY:  Significant for:  1. Allergic rhinitis.\  2. Asthma.  3. GERD.  4. Chronic back pain.  5. Retinitis pigmentosa.  6. History of MRSA on the right leg  7. History of ovarian cyst.   PAST SURGICAL HISTORY:  Appendectomy, cholecystectomy, C-section,  umbilical hernia repair x2, and then multiple surgeries related to her  last repair in May 2008.  Some sort of bladder surgery at age 83 which  was questionably a dilation.   MEDICATIONS:  1. Xanax 0.5 mg one-half to one p.o. b.i.d.  2. Vicodin 5/500 one every 6 hours as needed.  3. Flonase 50 mcg 2 puffs each nostril daily.  4. Advair Diskus 100/50 two puffs twice daily.  5. Zoloft 50 mg 1/2 tablet p.o. daily.  6. Allegra 180 mg 1 p.o. daily.  7. Singulair 10 mg 1 p.o. daily.  8. Nexium 40 mg 1 p.o. daily.  9. Multivitamin 1 p.o. daily.  10.Calcium 1 p.o. daily.  11.Vitamin C 1 p.o. daily.  12.Midrin as needed for dysmenorrhea and headache.   ALLERGIES:  SEPTRA, BEXTRA, LODINE, DETROL LA.   FAMILY HISTORY:  Cardiovascular disease, hypertension, and diabetes.   SOCIAL HISTORY:  The patient is married.  She lives with her husband and  daughter, who is 23.  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  She is a social alcohol user.  No tobacco.  No other drugs.   REVIEW OF SYSTEMS:  A 14-point review of systems is reviewed.  Please  see history on the chart.  She has swelling in her  legs, muscle aches,  fatigue, loss of urine with coughing or sneezing and some vaginal odor.   PHYSICAL EXAMINATION:  Blood pressure is 127/60.  Her weight is 342  pounds.  She is a morbidly obese female in no acute distress.  She has a very large pannus, well-healed midline scar there.  GENITOURINARY:  She has normal-appearing external female genitalia.  There is some heavy discharge noted.  The cervix is easily found, is  nulliparous in appearance.  There is no lesion noted.  BUS is normal.   PROCEDURE:  After Pap smear is done, the cervix was grasped with a  single-tooth tenaculum anteriorly and a Pipelle was passed.  The uterus  sounds approximately 9 cm.  The Pipelle was passed once, adequate tissue  sampling was obtained and the single-tooth tenaculum was removed from  the cervix.  The patient tolerated the procedure well.   IMPRESSION:  1. Menorrhagia and dysmenorrhea in the face of morbid obesity.  The      patient definitely needs endometrial sampling.  She is at risk for      endometrial carcinoma.  2. Yearly exam with Pap.   PLAN:  1. I do think this patient would be a good candidate for some sort of      definitive treatment, although hysterectomy will be very difficult      in this patient with this large mesh and hernia repair issue.  The      patient also had a previous C-section, would suggest hydrothermal      ablation or intrauterine device insertion.  The patient is not      really agreeable to intrauterine device insertion at this time, but      would be agreeable to hydrothermal ablation which could be done at      the same time if she needed surgery from the general surgeon,      although given her CT, I doubt that is going to be an issue.  Do      need to await results of her endometrial sampling, to be sure there      is no precancer or cancer lesions that would require some other      sort of definitive therapy.  2. Pap smear today.  3. She will follow up  in 2-4 weeks.           ______________________________  Tinnie Gens, MD     TP/MEDQ  D:  06/13/2007  T:  06/14/2007  Job:  [id number]   cc:   Roxy Manns, MD

## 2010-06-02 NOTE — Assessment & Plan Note (Signed)
Monica Madden, Monica Madden NO.:  [id number]   MEDICAL RECORD NO.:  [id number]          PATIENT TYPE:  POB   LOCATION:  [facility] at [facility]         FACILITY:  [facility]   PHYSICIAN:  Jaynie Collins, MD     DATE OF BIRTH:  January 17, 1961   DATE OF SERVICE:  08/12/2008                                  CLINIC NOTE   CHIEF COMPLAINT:  Annual examination.   HISTORY OF PRESENT ILLNESS:  The patient is a 50 year old, gravida 1,  para 1, who is here today for her annual gynecologic exam.  The patient  was last followed last year after an endometrial ablation that was done  in the setting of menorrhagia and dysmenorrhea.  She reports no further  bleeding since her endometrial ablation.  She does have some cyclical  cramping and some pink discharge but no frank bleeding.  She is very  satisfied with the ablation results.  The patient does complain of  having increased urinary frequency recently, however, had negative  evaluation by an urologist, named Dr. Andee Poles.  The urologist also  placed her on VESIcare.  She says that her urinary frequency has gotten  a little bit better on the VESIcare but is still problematic.  She  attributes this to her obesity.  The patient also complains of  malodorous vaginal discharge which happens frequently, and she is  desiring evaluation for this.  She denies any other gynecologic  symptoms.   Past medical history is significant for retinitis pigmentosa, chronic  back pain, gastroesophageal reflux disease, asthma, allergic rhinitis,  morbid obesity, hypertension.   PAST SURGICAL HISTORY:  Appendectomy, cholecystectomy, cesarean section,  umbilical hernia repair x2, and multiple surgeries related to her last  repair in September 2008.  Question of bladder surgery at age 83,  endometrial ablation, and hydrotherm ablation on August 29, 2007.   MEDICATIONS:  1. Xanax 0.5 mg as needed.  2. Vicodin 5/500 mg every 6 hours as needed.  3. Flonase 2 puffs  each nostril daily.  4. Advair 100/50 two puffs twice daily.  5. Zoloft 25 mg daily.  6. Allegra 180 mg daily.  7. Singulair 10 mg daily.  8. Nexium 40 mg daily.  9. Multivitamins daily.  10.Calcium.  11.__________ daily.  12.Vitamin C 1 tablet daily.  13.Midrin as needed for cramping or headache.  14.Lisinopril 5 mg daily.  15.VESIcare 5 mg daily.   ALLERGIES:  SEPTRA, BEXTRA, LODINE, DETROL LA.   Family history is remarkable for cardiovascular disease, hypertension,  and diabetes.  No family history of any cancers.   SOCIAL HISTORY:  The patient lives with her husband and her daughter.  She works at [employer] for [employer] and [place].  She denies any alcohol, tobacco, or any illicit drugs.   REVIEW OF SYSTEMS:  A 14-point review of systems was reviewed.  She  endorses chronic back pain, increased urinary frequency, and vaginal  odor.   PHYSICAL EXAMINATION:  VITAL SIGNS:  Temperature 98, pulse 83, blood  pressure 155/93, weight 341 pounds.  GENERAL:  No apparent distress.  HEENT:  Normocephalic, atraumatic.  NECK:  Supple.  No abnormal mass is palpated.  LUNGS:  Clear to auscultation bilaterally.  HEART:  Regular rate and rhythm.  BREASTS:  Symmetric in size, nontender.  No abnormal masses, skin  changes, drainage, or lymphadenopathy.  ABDOMEN:  Obese abdomen.  Well-healed midline scar.  No abnormal masses  palpated.  EXTREMITIES:  Some superficial varicosities in bilaterally extremities.  Otherwise, no cyanosis, clubbing, or edema.  Nontender.  GENITOURINARY:  Normal external female genitalia.  Pink, well-rugated  vagina.  Some white discharge is seen.  No orders received.  A sample  was taken for wet prep.  She has a nulliparous cervix and Pap smear was  obtained.  No lesions were seen on bimanual exam.  Unable to palpate  uterus or adnexa secondary to habitus.   ASSESSMENT/PLAN:  The patient is a 50 year old, gravida 1, para 1, here  for her  yearly examination.  She had normal breast examination, and a  Pap smear was done.  We will follow up results.  The patient does  complain of vaginal odor.  Discharge was collected for wet prep  analysis.  We will follow up results on this.  As for her urinary  frequency, an urinalysis was done here in the clinic and was negative.  We will continue to follow her symptoms, and the patient was told to  continue her VESIcare and also continue to get evaluated by her  urologist for any further urinary problems.  The patient will be  scheduled for a mammogram at the end of this visit.  Her other medical  issues will be followed by her primary care physician, Dr. Roxy Manns.  The patient was told to follow up in her gynecologic clinic for any  further gynecologic concerns.           ______________________________  Jaynie Collins, MD     UA/MEDQ  D:  08/12/2008  T:  08/13/2008  Job:  [id number]

## 2010-06-02 NOTE — H&P (Signed)
Monica Madden, MARCUS NO.:  [id number]   MEDICAL RECORD NO.:  [id number]          PATIENT TYPE:  EMS   LOCATION:  [facility]                         FACILITY:  [facility]   PHYSICIAN:  Cherylynn Ridges, M.D.    DATE OF BIRTH:  1960-03-10   DATE OF ADMISSION:  05/30/2006  DATE OF DISCHARGE:                              HISTORY & PHYSICAL   .   CHIEF COMPLAINT:  Right lower quadrant abdominal pain.   HISTORY OF PRESENT ILLNESS:  [name] is a 50 year old morbidly obese  female patient who has undergone prior ventral hernia repair in 1999 by  Dr. Jamey Ripa.  She reported last year she had noticed some bulging in the  right lower quadrant, was examined by Dr. Jamey Ripa.  CT was done but no  evidence of recurrence of hernia or any incarceration, although Dr.  Jamey Ripa felt that she probably had had some loosening of the tacking of  the mesh at that time.  About 5 weeks ago the patient reports that she  fell down and pulled and possibly struck her abdomen she noticed an  increased bulging in the same right lower quadrant area since that time.  As of yesterday the patient began having very severe pain in the right  lower quadrant.  She felt a tight bulge and this area was very tender to  touch and movement.  She did have one episode of emesis yesterday and  has had some mild anorexia.  She has had a bowel movement and since that  time has felt either hot or cold.  She reported to the ER because of the  intense pain.  She is quite tachycardic because of pain.  Upon  presentation her heart rate was 132.  She had a low grade fever of 100.9  and a CT of the abdomen was done after the patient's white cell count  was revealed to be 14,000.  The CT did reveal mesh in right pelvis with  area of small bowel protruding into a hernia sac at the same area,  probably incarcerated.  The patient also was found to have a 3 cm left  ovarian cyst.  Because of the abnormal findings on CT surgical  consultation was requested.  Because Dr. Jamey Ripa is not available at this  week, Dr. Lindie Spruce with [facility] has been consulted.   PAST HISTORY:  1. Asthma.  2. Retinitis pigmentosa   PAST SURGICAL HISTORY:  Ventral hernia repair with mesh as well as lap  cholecystectomy in 1999 by Dr. Jamey Ripa.   FAMILY HISTORY:  Noncontributory.   SOCIAL HISTORY:  No alcohol.  No tobacco.  No illegal drugs.  She is  married.  Her husband is with her today.   DRUG ALLERGIES:  NKDA.   CURRENT MEDICATIONS:  1. Advair discus inhaled  2. Allegra 180 mg daily.  3. Flonase once squirt to each nostril daily.  4. Nexium 40 mg daily.  5. Singulair 10 mg daily,  6. Vicodin as needed  7. Zoloft 25 mg daily.   PHYSICAL EXAMINATION:  GENERAL:  Pleasant female patient currently  reporting decreased pain after receiving IV narcotics in the ER.  Vital  Signs: Temperature 100.9.  This was at 7:37 this morning.  Temperature  has not been repeated and it is currently 2:45.  BP is 141/60, pulse was  initially 132 now 94, respirations 20.  NEURO:  Patient is alert and x3, moving all extremities x4.  No focal  deficits.  HEENT:  Head normocephalic.  Sclera noninjected.  NECK:  Supple without adenopathy.  CHEST: Bilateral lung sounds clear to auscultation.  Respiratory effort  is nonlabored.  She is on room air.  CARDIAC:  S1-S2.  No obvious rubs, murmurs or gallops.  ABDOMEN:  Obese with a large soft pannus.  There is a vertical midline  incision that is well-healed.  There is a small area of herniation that  is soft and nontender at the distal portion of the incision.  This  reduces easily.  With palpation across the lower abdominal wall I am to  appreciate a wall defect in the right lower quadrant and just lateral to  this area of defect there is a soft, tender mass.  The defect itself is  irregular in nature.  She has bowel sounds present but they are  diminished.  EXTREMITIES:  Symmetrical in appearance without  obvious edema, cyanosis  or clubbing.  Pulses are palpable.   LAB AND X-RAY:  CT scan as noted in the history of present illness.  White count 14,800, neutrophil 89%, hemoglobin 11.6, platelets 319,000.  Sodium 133, potassium 3.5, CO2 24, glucose 137, BUN 7, creatinine 0.58.   IMPRESSION:  1. Recurrent abdominal wall hernia, history of mesh placement now with      small bowel through hernia, possible early incarceration.  2. Leukocytosis and fever  3. Hyperglycemia  4. Asthma, currently compensated and stable.   PLAN:  1. Admit the patient to general surgical floor, continue n.p.o. status      as well as IV fluid hydration.  2. The patient will probably go to the OR today given the fact that      she could have incarceration of small bowel.  Discussed this with      the patient and her husband that she will need repair of this      within the next 24-48 hours and given the fact that there may be      some small bowel incarcerated she may end up with a partial small      bowel resection.  They expressed understanding and are amenable to      proceeding.  Additional discussion regarding risks and benefits of      procedure to be done once Dr. Lindie Spruce examines the patient.  3. Empiric Zosyn IV preoperatively.  4. Check a CBC and CMET in the morning. We will get pre preoperative      PT/PTT as well as chest x-ray and EKG since the patient is over age      8 and has a history of asthma and is morbidly obese.  5. Restart normal home medications by mouth postoperatively once the      patient is able to tolerate oral diet.      [name], N.P.      Cherylynn Ridges, M.D.  Electronically Signed    ALE/MEDQ  D:  05/30/2006  T:  05/30/2006  Job:  [id number]   cc:   Currie Paris, M.D.

## 2010-06-02 NOTE — Op Note (Signed)
NAMEJODELL, WEITMAN NO.:  [id number]   MEDICAL RECORD NO.:  [id number]          PATIENT TYPE:  INP   LOCATION:  5703                         FACILITY:  [facility]   PHYSICIAN:  Cherylynn Ridges, M.D.    DATE OF BIRTH:  11/17/1960   DATE OF PROCEDURE:  06/03/2006  DATE OF DISCHARGE:                               OPERATIVE REPORT   PREOPERATIVE DIAGNOSIS:  Incarcerated recurrent ventral hernia.   POSTOPERATIVE DIAGNOSES:  1. Incarcerated recurrent ventral hernia.  2. Incarcerated chronically inflamed appendix.   PROCEDURES:  1. Repair of recurrent ventral hernia with mesh.  2. Incidental appendectomy.   SURGEON:  Cherylynn Ridges, M.D.   ASSISTANT:  Adolph Pollack, M.D.   ANESTHESIA:  General endotracheal.   ESTIMATED BLOOD LOSS:  Less than 100 mL.   COMPLICATIONS:  None.   CONDITION:  Stable.   FINDINGS:  The patient had two ventral hernia defects primarily.  One in  the upper to lower portion of the abdomen, the other one in the right  lower quadrant which had incarcerated small bowel and appendix.   OPERATION:  The patient was taken to the operating room, placed on table  in supine position.  After an adequate general anesthetic was  administered she was prepped and draped in usual sterile manner exposing  the midline.   A 20 cm long midline incision was made down to subcutaneous tissue.  We  dissected out through the hernia sac initially in the mid portion of the  abdomen circumferentially.  The lower portion was densely adhesed to  subcutaneous scar tissue where the fascial edges were obscured.  There  were loops of that we thought to be small bowel that were definitely  incarcerated omentum.  We detached the hernia sac from the fascial edge  and then subsequently mobilized and once we were underneath the fascia,  could palpate the multiple other defects extending inferiorly.  The  largest of the defects was inferolaterally to the right side where  there  was incarcerated small bowel and also incarcerated appendix.  We had  opened up this the area slightly in order to free up the appendix, then  subsequently we did an appendectomy taking the mesoappendix with Kelly  clamps and 2-0 silk ties and the base of the appendix was stapled off  using a TA-30 stapler.  Because it was now a contaminated wound, we were  reluctant to use any mesh.  However, our only recourse was to use mesh  in an onlay manner.  We did not use any inlay mesh.  Once appendectomy  had been done, we changed our gloves and then we continued to free up  the fascia, in order to attach the mesh and also to close the repair.  We did a primary closed using interrupted simple stitches of #1 Novofil.  We then did an onlay from 12 x 12 inch mesh folding it in half and then  attaching the fold of the dural layer of mesh, tacking it around  circumferentially and #1 Novofil interrupted sutures.  Once it had been  circumferentially tacked down,  we also attached it to the mid portion  using the #1 Novofil.  Two 19 mm Blake drains were brought up inferiorly  into the subcutaneous tissue bilaterally.  We closed the subcu with  running 2-0 Vicryl suture.  The skin was closed using stainless steel  staples.  The mesh had been soaked in antibiotic solution prior to  implantation and we also irrigated with antibiotic solution.  We closed  the skin using stainless steel staples.  All needle counts, sponge  counts and instrument counts were correct.      Cherylynn Ridges, M.D.  Electronically Signed     JOW/MEDQ  D:  06/03/2006  T:  06/03/2006  Job:  [id number]

## 2010-06-05 NOTE — Discharge Summary (Signed)
Monica Madden, Monica Madden NO.:  [id number]   MEDICAL RECORD NO.:  [id number]          PATIENT TYPE:  INP   LOCATION:  5703                         FACILITY:  [facility]   PHYSICIAN:  Ollen Gross. Vernell Morgans, M.D. DATE OF BIRTH:  07/13/1960   DATE OF ADMISSION:  05/30/2006  DATE OF DISCHARGE:  06/06/2006                               DISCHARGE SUMMARY   CHIEF COMPLAINT/REASON FOR ADMISSION:  Monica Madden is a 50 year old  female patient morbidly obese.  History of prior ventral hernia repair  in 1999 by Dr. Evern Core.  She developed abdominal pain recently and a CT  of the abdomen was done that revealed mesh in the right pelvis with an  area of small bowel protruding into a hernia sac possible incarcerated.  In the ER she was tachycardic with a heart rate 132, fever 100.9 and  white count 14,000.  On abdominal exam can had a small area of  herniation in the vertical midline incision that was easily reducible  and nontender.  With palpation across the lower abdominal wall there was  appreciable wall defect in the right lower quadrant just lateral to this  area.  There was also a soft, tender mass.  Because of these findings  the patient was planned to be admitted for bowel rest and IV fluid  hydration and possible surgical intervention if symptoms do not improve  with the next 48-72 hours.   ADMISSION DIAGNOSES:  1. Recurrent abdominal wall hernia, history of mesh with possible      small bowel hernia and early incarceration.  2. Leukocytosis and fever.  3. Hyperglycemia.  4. Asthma.   HOSPITAL COURSE:  The patient was admitted by Dr. Lindie Spruce to the general  floor where she was placed on n.p.o. status, IV fluids and bowel rest.  For the next 2 days after admission she continued to have abdominal pain  without improvement in her symptoms.  Plans were to take the patient to  the OR on Jun 02, 2006 but no rooms were available.  She was still  having symptoms on the 16th and therefore it  was felt she would benefit  from surgical repair, so she was taken to the OR on Jun 03, 2006 with a  preoperative diagnosis of incarcerated recurrent ventral hernia.  Postoperative diagnosis was the same with an incarcerated appendix as  well as small bowel and omentum.  She underwent repair of incarcerated  ventral hernia that was recurrent and appendectomy.  She tolerated the  procedure well and was sent back to the floor to recover.   From a postoperative standpoint the patient did well but was slow to  recover.  She had some nausea related to medications and pain was best  controlled with combination of Toradol and PCA morphine.  She was  started on clear liquids by postop day #1 and by postop day #3 was  tolerating a solid diet.  She did have JP's in place bilaterally and Dr.  Lindie Spruce felt that since these were draining an average of 20-40 mL over a  24 hour period, given the  patient's history of mesh placement and large  abdomen size that she would benefit from going home with the JP's in  place and follow up with Dr. Lindie Spruce in one week to evaluate for a  discontinuation/removal of these drains.   FINAL DISCHARGE DIAGNOSES:  1. Recurrent ventral hernia with associated small bowel and omental as      well as appendix incarceration.  2. Status post repair of ventral hernia with mesh as well as      appendectomy.   DISCHARGE MEDICATIONS:  1. Continue previous medications she was taking prior to admission.      Please see medication reconciliation sheet.  2. She has been given a prescription for Vicodin to use at home, 1-2      tablets every 4 hours as needed for pain.   Return to work six weeks after surgery date.  Final determination per  Dr. Lindie Spruce.   WOUND CARE:  Keep staples dry.  Return to doctor in one week for staple  removal and possible drain removal.   ACTIVITY:  Increase activity slowly. May walk up steps.  Sponge bathe  while drains in place.  No lifting more than 15  pounds for 6 weeks.  No  driving for 2 weeks.   ADDITIONAL INSTRUCTIONS:  Empty the JP drains at least daily.  Record  amount and bring readings to your visit with Dr. Lindie Spruce.   FOLLOW UP:  You need to call Dr. Dixon Madden office at [phone]  to be seen  in one week for drain and staple removal.      Monica Madden, N.POllen Gross. Vernell Morgans, M.D.  Electronically Signed    ALE/MEDQ  D:  08/11/2006  T:  08/12/2006  Job:  [id number]   cc:   Monica Madden, M.D.

## 2010-06-08 ENCOUNTER — Other Ambulatory Visit: Payer: Self-pay

## 2010-06-08 MED ORDER — SERTRALINE HCL 50 MG PO TABS: 25.0000 mg | ORAL_TABLET | Freq: Every day | ORAL | Status: DC

## 2010-06-08 NOTE — Telephone Encounter (Signed)
The first rx printed instead of going electronically.

## 2010-06-27 ENCOUNTER — Encounter: Payer: Self-pay | Admitting: Family Medicine

## 2010-07-01 ENCOUNTER — Ambulatory Visit (INDEPENDENT_AMBULATORY_CARE_PROVIDER_SITE_OTHER): Payer: 59 | Admitting: Family Medicine

## 2010-07-01 ENCOUNTER — Encounter: Payer: Self-pay | Admitting: Family Medicine

## 2010-07-01 DIAGNOSIS — E785 Hyperlipidemia, unspecified: Secondary | ICD-10-CM

## 2010-07-01 DIAGNOSIS — F341 Dysthymic disorder: Secondary | ICD-10-CM

## 2010-07-01 DIAGNOSIS — Z1231 Encounter for screening mammogram for malignant neoplasm of breast: Secondary | ICD-10-CM

## 2010-07-01 DIAGNOSIS — T887XXA Unspecified adverse effect of drug or medicament, initial encounter: Secondary | ICD-10-CM

## 2010-07-01 DIAGNOSIS — I10 Essential (primary) hypertension: Secondary | ICD-10-CM

## 2010-07-01 DIAGNOSIS — M25511 Pain in right shoulder: Secondary | ICD-10-CM | POA: Insufficient documentation

## 2010-07-01 DIAGNOSIS — E669 Obesity, unspecified: Secondary | ICD-10-CM

## 2010-07-01 DIAGNOSIS — T50905A Adverse effect of unspecified drugs, medicaments and biological substances, initial encounter: Secondary | ICD-10-CM | POA: Insufficient documentation

## 2010-07-01 DIAGNOSIS — M25519 Pain in unspecified shoulder: Secondary | ICD-10-CM

## 2010-07-01 LAB — COMPREHENSIVE METABOLIC PANEL
ALT: 21 U/L (ref 0–35)
Albumin: 4.1 g/dL (ref 3.5–5.2)
Alkaline Phosphatase: 76 U/L (ref 39–117)
Glucose, Bld: 91 mg/dL (ref 70–99)
Potassium: 4.7 mEq/L (ref 3.5–5.1)
Sodium: 139 mEq/L (ref 135–145)
Total Bilirubin: 0.6 mg/dL (ref 0.3–1.2)
Total Protein: 7.1 g/dL (ref 6.0–8.3)

## 2010-07-01 LAB — LIPID PANEL
Total CHOL/HDL Ratio: 5
VLDL: 25.6 mg/dL (ref 0.0–40.0)

## 2010-07-01 LAB — LDL CHOLESTEROL, DIRECT: Direct LDL: 147 mg/dL

## 2010-07-01 MED ORDER — SERTRALINE HCL 50 MG PO TABS: 50.0000 mg | ORAL_TABLET | Freq: Every day | ORAL | Status: DC

## 2010-07-01 MED ORDER — CYCLOBENZAPRINE HCL 10 MG PO TABS: 10.0000 mg | ORAL_TABLET | Freq: Three times a day (TID) | ORAL | Status: AC | PRN

## 2010-07-01 MED ORDER — LISINOPRIL 5 MG PO TABS: 5.0000 mg | ORAL_TABLET | ORAL | Status: DC

## 2010-07-01 MED ORDER — SOLIFENACIN SUCCINATE 5 MG PO TABS: 5.0000 mg | ORAL_TABLET | Freq: Every day | ORAL | Status: DC

## 2010-07-01 MED ORDER — ALPRAZOLAM 1 MG PO TABS: 1.0000 mg | ORAL_TABLET | Freq: Three times a day (TID) | ORAL | Status: DC | PRN

## 2010-07-01 NOTE — Patient Instructions (Addendum)
Since you cannot take anti inflammatories for your shoulder- see Dr Gerrit Heck- a shot may help It needs further evaluation Get your vicodin from him also  Increase your zoloft 50 mg daily while you need it - update if side effects  Keep up the great job with weight loss  We will schedule mammogram at check out  Labs today  Follow up 6 months

## 2010-07-01 NOTE — Assessment & Plan Note (Signed)
Suspect rot cuff tendonitis Cannot take nsaids  Will return to her ortho for eval and poss injection / x ray

## 2010-07-01 NOTE — Assessment & Plan Note (Signed)
Schedule this - no lumps on self exam

## 2010-07-01 NOTE — Assessment & Plan Note (Signed)
Refilled this  Stressors lately- inc zoloft to 50 and update  Xanax refilled - uses sparingly F/u 6 mo

## 2010-07-01 NOTE — Progress Notes (Signed)
Subjective:    Patient ID: Monica Madden, female    DOB: [birth year], 50 y.o.   MRN: [id number]  HPI Here for leg and shoulder pain and to refil chronic meds  Wt id down 21 lb Has been working on it hard  Eating a lot less  Cannot exercise much due to chronic pain Tries to clean house   Has meniscus tears in knee and chronic leg pain  Not really worse but bothersome  Last time she got cortisone shot dec   New pain in R shoulder -- bad / intense pain  Wakes her up every am  Rad down her arm  Pain in 'the joint" Sleeps in recliner due to back -? Leans on that side  Very stiff in am  Hurts to reach up or over (carries purse on that arm)  No injury that she knows of -- but runs into walls all the time due to blindness   Lost her job  husb told her he wants divorce-- she wants one too -he drinks all the time  Too broke to get it  Is applying disability  Her depression is worse -- due to stress and thinks it is short term  Would like to inc her medicine for a while Cannot afford counseling at this time   Due for labs - for adv eff of med   Patient Active Problem List  Diagnoses  . HYPERLIPIDEMIA  . OBESITY  . ANXIETY DEPRESSION  . MIGRAINE HEADACHE  . RETINITIS PIGMENTOSA  . ESSENTIAL HYPERTENSION  . VARICOSE VEINS, LOWER EXTREMITIES  . ALLERGIC RHINITIS  . ASTHMA  . GERD  . DYSPEPSIA  . INCI HERNIA WITHOUT MENTION OBSTRUCTION/GANGRENE  . CONSTIPATION  . IRRITABLE BOWEL SYNDROME  . DYSMENORRHEA, SEVERE  . 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  . ARTHRALGIA  . KNEE PAIN, BILATERAL  . BACK PAIN, LUMBAR, CHRONIC  . MUSCLE SPASM, LUMBAR REGION  . LEG PAIN, LEFT  . DEPENDENT EDEMA, LEGS  . DIARRHEA  . INCONTINENCE, URGE  . FREQUENCY, URINARY  . Adverse effects of medication  . Other screening mammogram  . Right shoulder pain   Past Medical History  Diagnosis Date  . Allergy     allergic rhinitis  . Asthma   . Obesity   . Chronic back pain    OA of spine  . Retinitis pigmentosa     blind  . MRSA infection 2008    Right leg  . Ovarian cyst   . Hypertension 03/2008  . IBS (irritable bowel syndrome)   . GERD (gastroesophageal reflux disease)     Endo negative 02/2000  . Cataract     left cataract removal 12/2008   Past Surgical History  Procedure Date  . Hernia repair 05/2006    ruptured hernia repair after fall and then two more surgeries within as many weeks for complications(05/2006) Umbilical hernia repair (06/1997)  . Appendectomy   . Cholecystectomy   . Cesarean section   . Bladder surgery 1968    age 18 ? dilitation  . Endometrial biopsy 07/1998    negative  . Cystoscopy 03/2008    negative// Dr. Wanda Plump urologist  . Eye surgery 12/2008    cataract removal left  . Knee arthroscopy w/ meniscal repair     left knne   History  Substance Use Topics  . Smoking status: Never Smoker   . Smokeless tobacco: Not on file  . Alcohol Use: Not on file   Family  History  Problem Relation Age of Onset  . Depression Mother   . Diabetes Maternal Uncle   . Hypertension Maternal Grandmother   . Depression Brother     Depression secondary to MVA injuries   Allergies  Allergen Reactions  . Diclofenac Sodium     REACTION: allergic/ made asthma bad  . Etodolac     REACTION: GI  . Metronidazole     REACTION: GI side eff  . Sulfamethoxazole W/Trimethoprim   . Tolterodine Tartrate     REACTION: None Effective   Current Outpatient Prescriptions on File Prior to Visit  Medication Sig Dispense Refill  . albuterol (VENTOLIN HFA) 108 (90 BASE) MCG/ACT inhaler As needed for asthma attack       . APAP-Isometheptene-Dichloral (EPIDRIN) 325-65-100 MG CAPS Take 1 every 6 hours as needed       . Ascorbic Acid (VITAMIN C) 1000 MG tablet Take 1,000 mg by mouth daily.        . B Complex Vitamins (B COMPLEX 100 PO) Take one tablet by mouth every other day       . Calcium Carbonate (CALCIUM 600) 1500 MG TABS Take as directed.        . cyclobenzaprine (FLEXERIL) 10 MG tablet Take 10 mg by mouth 3 (three) times daily as needed. Be careful of sedation       . fexofenadine (ALLEGRA) 180 MG tablet Take 180 mg by mouth daily.        . fluticasone (FLONASE) 50 MCG/ACT nasal spray 2 puffs in each nostril daily       . Fluticasone-Salmeterol (ADVAIR DISKUS) 100-50 MCG/DOSE AEPB Inhale 1 puff into the lungs every 12 (twelve) hours.        . montelukast (SINGULAIR) 10 MG tablet Take 10 mg by mouth daily.        . MULTIPLE VITAMIN PO Take one tablet by mouth every other day.       . Omega 3-6-9 Fatty Acids (OMEGA 3-6-9 COMPLEX PO) Take one tablet by mouth daily       . esomeprazole (NEXIUM) 40 MG capsule Take 40 mg by mouth daily before breakfast.        . famotidine (PEPCID) 10 MG tablet Take 10 mg by mouth 2 (two) times daily.        . Methylcellulose, Laxative, (CITRUCEL) 500 MG TABS Take one tablet twice a day every other day       . nystatin (MYCOSTATIN) 100000 UNIT/ML suspension 1 teaspoon three times a day swish and swallow until mouth is clear as needed         Review of Systems Review of Systems  Constitutional: Negative for fever, appetite change, fatigue and unexpected weight change.  Eyes: pos for blindness/ no eye pain .  Respiratory: Negative for cough and shortness of breath.   Cardiovascular: Negative.  for cp or palpitations Gastrointestinal: Negative for nausea, diarrhea and constipation.  Genitourinary: Negative for urgency and frequency.  MSK pos for joint pain without swelling  Skin: Negative for pallor. or rash  Neurological: Negative for weakness, light-headedness, numbness and headaches.  Hematological: Negative for adenopathy. Does not bruise/bleed easily.  Psychiatric/Behavioral: Negative for dysphoric mood. The patient is not nervous/anxious.          Objective:   Physical Exam  Constitutional: She appears well-developed and well-nourished. No distress.       Morbidly obese- well appearing  Is  blind  HENT:  Head: Normocephalic and atraumatic.  Mouth/Throat: Oropharynx is  clear and moist.  Eyes: EOM are normal. Pupils are equal, round, and reactive to light.       Legally blind  Neck: Normal range of motion. Neck supple. No JVD present. Carotid bruit is not present. No thyromegaly present.  Cardiovascular: Normal rate, regular rhythm, normal heart sounds and intact distal pulses.   Pulmonary/Chest: Effort normal and breath sounds normal. No respiratory distress. She has no wheezes. She exhibits no tenderness.  Abdominal: Soft. Bowel sounds are normal. She exhibits no distension and no mass. There is no tenderness.  Musculoskeletal: She exhibits tenderness. She exhibits no edema.       R shoulder  No swelling or skin change or warmth Passive rom- pain on abd over 90 deg and active less than that  Pain on int/ ext rot (scratch test) Pos hawking/ neer tests  Nl grip and strength of limb   Lymphadenopathy:    She has no cervical adenopathy.  Neurological: She is alert. She has normal reflexes. No cranial nerve deficit.  Skin: Skin is warm and dry. No rash noted. No erythema. No pallor.  Psychiatric: She has a normal mood and affect.          Assessment & Plan:

## 2010-07-02 NOTE — Assessment & Plan Note (Signed)
Good job on wt loss so far Commended and enc to continue

## 2010-07-02 NOTE — Assessment & Plan Note (Signed)
cmet today 

## 2010-07-02 NOTE — Assessment & Plan Note (Signed)
Good control No changes  Lab today and update

## 2010-07-02 NOTE — Assessment & Plan Note (Signed)
Due for check  Better diet lately- commended

## 2010-08-10 ENCOUNTER — Ambulatory Visit: Payer: Self-pay | Admitting: Family Medicine

## 2010-08-14 ENCOUNTER — Encounter: Payer: Self-pay | Admitting: Family Medicine

## 2010-09-07 ENCOUNTER — Other Ambulatory Visit: Payer: Self-pay

## 2010-09-07 MED ORDER — ALPRAZOLAM 1 MG PO TABS: 1.0000 mg | ORAL_TABLET | Freq: Three times a day (TID) | ORAL | Status: DC | PRN

## 2010-09-07 NOTE — Telephone Encounter (Signed)
[facility]ill request for Alprazolam 1mg . Please advise.

## 2010-09-07 NOTE — Telephone Encounter (Signed)
Medication phoned to [facility] pharmacy as instructed.  

## 2010-09-07 NOTE — Telephone Encounter (Signed)
Px written for call in   

## 2010-10-16 LAB — CBC
HCT: 33.8 — ABNORMAL LOW
MCHC: 32.7
RBC: 4.34
RDW: 17 — ABNORMAL HIGH
WBC: 9.4

## 2011-01-01 ENCOUNTER — Encounter: Payer: Self-pay | Admitting: Family Medicine

## 2011-01-01 ENCOUNTER — Ambulatory Visit (INDEPENDENT_AMBULATORY_CARE_PROVIDER_SITE_OTHER): Payer: 59 | Admitting: Family Medicine

## 2011-01-01 VITALS — BP 118/74 | HR 72 | Temp 98.3°F | Ht 64.0 in | Wt 280.0 lb

## 2011-01-01 DIAGNOSIS — E785 Hyperlipidemia, unspecified: Secondary | ICD-10-CM

## 2011-01-01 DIAGNOSIS — Z23 Encounter for immunization: Secondary | ICD-10-CM

## 2011-01-01 DIAGNOSIS — I10 Essential (primary) hypertension: Secondary | ICD-10-CM

## 2011-01-01 DIAGNOSIS — E669 Obesity, unspecified: Secondary | ICD-10-CM

## 2011-01-01 MED ORDER — ALPRAZOLAM 1 MG PO TABS: 1.0000 mg | ORAL_TABLET | Freq: Three times a day (TID) | ORAL | Status: DC | PRN

## 2011-01-01 NOTE — Patient Instructions (Signed)
Here is px for your xanax  No change in medicines Schedule annual exam with labs prior in 6 months

## 2011-01-01 NOTE — Progress Notes (Signed)
Subjective:    Patient ID: Monica Madden, female    DOB: [birth year], 50 y.o.   MRN: [id number]  HPI Here for f/u of chronic medical problems including HTN and lipids and obesity Is doing pretty good   Went for shot in shoulder recently  Is worried about rotator cuff problem/ bone spur Cannot afford MRI at this time    Is on a new gel -- cascadle diclofenac/ bacl/ bupiv / gaba gel -- from ortho- giving it a try Being very cautious with it and uses on cold days    Since last visit lost 11 lb Is glad she is loosing  Eating less for stress - just does not want food as well  bmi is down 2 points to 48  bp is 118/74    Today Very well controlled  No cp or palpitations or headaches or edema  No side effects to medicines    Flu shot - got that today   Lipids  Lab Results  Component Value Date   CHOL 204* 07/01/2010   HDL 44.00 07/01/2010   LDLCALC 116* 09/02/2009   LDLDIRECT 147.0 07/01/2010   TRIG 128.0 07/01/2010   CHOLHDL 5 07/01/2010   was fair with LDL calc 116 Diet -- fruits and vegetables , much less meat, some lean protien - doing it with her daughter - she lost weight too !  Still stressed - did get approved for disability - first check will be in April  Refinanced her house - 2nd mortgage  Does not get along with her husband  Does have good support with her daughter  No abuse at all - but husband is a drinker   Patient Active Problem List  Diagnoses  . HYPERLIPIDEMIA  . OBESITY  . ANXIETY DEPRESSION  . MIGRAINE HEADACHE  . RETINITIS PIGMENTOSA  . ESSENTIAL HYPERTENSION  . VARICOSE VEINS, LOWER EXTREMITIES  . ALLERGIC RHINITIS  . ASTHMA  . GERD  . DYSPEPSIA  . INCI HERNIA WITHOUT MENTION OBSTRUCTION/GANGRENE  . CONSTIPATION  . IRRITABLE BOWEL SYNDROME  . DYSMENORRHEA, SEVERE  . 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  . ARTHRALGIA  . KNEE PAIN, BILATERAL  . BACK PAIN, LUMBAR, CHRONIC  . MUSCLE SPASM, LUMBAR REGION  . LEG PAIN, LEFT  .  DEPENDENT EDEMA, LEGS  . DIARRHEA  . INCONTINENCE, URGE  . FREQUENCY, URINARY  . Adverse effects of medication  . Other screening mammogram  . Right shoulder pain   Past Medical History  Diagnosis Date  . Allergy     allergic rhinitis  . Asthma   . Obesity   . Chronic back pain     OA of spine  . Retinitis pigmentosa     blind  . MRSA infection 2008    Right leg  . Ovarian cyst   . Hypertension 03/2008  . IBS (irritable bowel syndrome)   . GERD (gastroesophageal reflux disease)     Endo negative 02/2000  . Cataract     left cataract removal 12/2008   Past Surgical History  Procedure Date  . Hernia repair 05/2006    ruptured hernia repair after fall and then two more surgeries within as many weeks for complications(05/2006) Umbilical hernia repair (06/1997)  . Appendectomy   . Cholecystectomy   . Cesarean section   . Bladder surgery 1968    age 59 ? dilitation  . Endometrial biopsy 07/1998    negative  . Cystoscopy 03/2008    negative// Dr. Wanda Plump urologist  .  Eye surgery 12/2008    cataract removal left  . Knee arthroscopy w/ meniscal repair     left knne   History  Substance Use Topics  . Smoking status: Never Smoker   . Smokeless tobacco: Not on file  . Alcohol Use: Not on file   Family History  Problem Relation Age of Onset  . Depression Mother   . Diabetes Maternal Uncle   . Hypertension Maternal Grandmother   . Depression Brother     Depression secondary to MVA injuries   Allergies  Allergen Reactions  . Diclofenac Sodium     REACTION: allergic/ made asthma bad  . Etodolac     REACTION: GI  . Metronidazole     REACTION: GI side eff  . Sulfamethoxazole W/Trimethoprim   . Tolterodine Tartrate     REACTION: None Effective   Current Outpatient Prescriptions on File Prior to Visit  Medication Sig Dispense Refill  . albuterol (VENTOLIN HFA) 108 (90 BASE) MCG/ACT inhaler As needed for asthma attack       . Ascorbic Acid (VITAMIN C) 1000 MG  tablet Take 1,000 mg by mouth daily.        . B Complex Vitamins (B COMPLEX 100 PO) Take one tablet by mouth every other day       . Calcium Carbonate (CALCIUM 600) 1500 MG TABS Take as directed.       Marland Kitchen esomeprazole (NEXIUM) 40 MG capsule Take 40 mg by mouth daily before breakfast.        . fexofenadine (ALLEGRA) 180 MG tablet Take 180 mg by mouth daily.        . fluticasone (FLONASE) 50 MCG/ACT nasal spray 2 puffs in each nostril daily       . Fluticasone-Salmeterol (ADVAIR DISKUS) 100-50 MCG/DOSE AEPB Inhale 1 puff into the lungs every 12 (twelve) hours.        Marland Kitchen lisinopril (PRINIVIL,ZESTRIL) 5 MG tablet Take 1 tablet (5 mg total) by mouth every morning.  30 tablet  11  . montelukast (SINGULAIR) 10 MG tablet Take 10 mg by mouth daily.        . MULTIPLE VITAMIN PO Take one tablet by mouth every other day.       . Omega 3-6-9 Fatty Acids (OMEGA 3-6-9 COMPLEX PO) Take one tablet by mouth daily       . ranitidine (ZANTAC) 150 MG tablet Take 150 mg by mouth 2 (two) times daily.        . sertraline (ZOLOFT) 50 MG tablet Take 1 tablet (50 mg total) by mouth daily.  30 tablet  11  . solifenacin (VESICARE) 5 MG tablet Take 1 tablet (5 mg total) by mouth daily. 1 by mouth every other day for bladder  30 tablet  11  . APAP-Isometheptene-Dichloral (EPIDRIN) 325-65-100 MG CAPS Take 1 every 6 hours as needed       . cyclobenzaprine (FLEXERIL) 10 MG tablet Take 1 tablet (10 mg total) by mouth every 8 (eight) hours as needed for muscle spasms.  30 tablet  1  . ibuprofen (ADVIL,MOTRIN) 200 MG tablet Take 200 mg by mouth every 6 (six) hours as needed.        . nystatin (MYCOSTATIN) 100000 UNIT/ML suspension 1 teaspoon three times a day swish and swallow until mouth is clear as needed          Review of Systems Review of Systems  Constitutional: Negative for fever, appetite change,  and unexpected weight change.  Eyes: Negative  for pain and pos for blindness  Respiratory: Negative for cough and shortness of  breath.   Cardiovascular: Negative for cp or palpitations    Gastrointestinal: Negative for nausea, diarrhea and constipation.  Genitourinary: Negative for urgency and frequency.  Skin: Negative for pallor or rash  MSK pos for joint pain due to OA  Neurological: Negative for weakness, light-headedness, numbness and headaches.  Hematological: Negative for adenopathy. Does not bruise/bleed easily.  Psychiatric/Behavioral: Negative for dysphoric mood. The patient is not nervous/anxious.          Objective:   Physical Exam  Constitutional: She appears well-developed and well-nourished. No distress.  HENT:  Head: Normocephalic and atraumatic.  Mouth/Throat: Oropharynx is clear and moist.  Eyes: Conjunctivae and EOM are normal. Pupils are equal, round, and reactive to light. No scleral icterus.  Neck: Normal range of motion. Neck supple. Carotid bruit is not present. No tracheal deviation present. No thyromegaly present.  Cardiovascular: Normal rate, regular rhythm, normal heart sounds and intact distal pulses.  Exam reveals no gallop.   Pulmonary/Chest: Effort normal and breath sounds normal. No respiratory distress. She has no rales. She exhibits no tenderness.  Abdominal: Soft. Bowel sounds are normal. She exhibits no distension, no abdominal bruit and no mass. There is no tenderness.  Musculoskeletal: She exhibits edema. She exhibits no tenderness.  Lymphadenopathy:    She has no cervical adenopathy.  Neurological: She is alert. She has normal reflexes. No cranial nerve deficit. She exhibits normal muscle tone. Coordination normal.  Skin: Skin is warm and dry. No rash noted. No erythema. No pallor.  Psychiatric: She has a normal mood and affect.       Pt candidly discusses stress- but remains calm and not tearful          Assessment & Plan:

## 2011-01-03 NOTE — Assessment & Plan Note (Signed)
bp in fair control at this time  No changes needed  Disc lifstyle change with low sodium diet and exercise   

## 2011-01-03 NOTE — Assessment & Plan Note (Signed)
Commended on wt loss so far - has done well  Disc plan for less calories/ healthier foods and more activity Urged to keep it up

## 2011-01-03 NOTE — Assessment & Plan Note (Signed)
Disc goals for lipids and reasons to control them Rev labs with pt Rev low sat fat diet in detail  No meds- is diet controlled at this time

## 2011-05-03 ENCOUNTER — Other Ambulatory Visit: Payer: Self-pay | Admitting: *Deleted

## 2011-05-03 MED ORDER — ALPRAZOLAM 1 MG PO TABS: 1.0000 mg | ORAL_TABLET | Freq: Three times a day (TID) | ORAL | Status: DC | PRN

## 2011-05-03 NOTE — Telephone Encounter (Signed)
Px written for call in   

## 2011-05-03 NOTE — Telephone Encounter (Signed)
Received a faxed refill request from pharmacy. Is it okay to refill medication?

## 2011-05-04 NOTE — Telephone Encounter (Signed)
Rx called to [facility] pharmacy. 

## 2011-06-30 ENCOUNTER — Encounter: Payer: Self-pay | Admitting: Family Medicine

## 2011-06-30 ENCOUNTER — Ambulatory Visit (INDEPENDENT_AMBULATORY_CARE_PROVIDER_SITE_OTHER): Payer: BC Managed Care – PPO | Admitting: Family Medicine

## 2011-06-30 VITALS — BP 114/70 | HR 76 | Temp 97.9°F | Ht 61.0 in | Wt 288.8 lb

## 2011-06-30 DIAGNOSIS — R8290 Unspecified abnormal findings in urine: Secondary | ICD-10-CM | POA: Insufficient documentation

## 2011-06-30 DIAGNOSIS — N912 Amenorrhea, unspecified: Secondary | ICD-10-CM | POA: Insufficient documentation

## 2011-06-30 DIAGNOSIS — E669 Obesity, unspecified: Secondary | ICD-10-CM

## 2011-06-30 DIAGNOSIS — I10 Essential (primary) hypertension: Secondary | ICD-10-CM

## 2011-06-30 DIAGNOSIS — Z1231 Encounter for screening mammogram for malignant neoplasm of breast: Secondary | ICD-10-CM

## 2011-06-30 DIAGNOSIS — Z23 Encounter for immunization: Secondary | ICD-10-CM

## 2011-06-30 DIAGNOSIS — R82998 Other abnormal findings in urine: Secondary | ICD-10-CM

## 2011-06-30 DIAGNOSIS — E785 Hyperlipidemia, unspecified: Secondary | ICD-10-CM

## 2011-06-30 DIAGNOSIS — Z0000 Encounter for general adult medical examination without abnormal findings: Secondary | ICD-10-CM

## 2011-06-30 LAB — TSH: TSH: 1.17 u[IU]/mL (ref 0.35–5.50)

## 2011-06-30 LAB — POCT URINALYSIS DIPSTICK
Ketones, UA: NEGATIVE
Leukocytes, UA: NEGATIVE
Nitrite, UA: NEGATIVE
Protein, UA: NEGATIVE
pH, UA: 5

## 2011-06-30 LAB — COMPREHENSIVE METABOLIC PANEL
ALT: 19 U/L (ref 0–35)
Albumin: 4.1 g/dL (ref 3.5–5.2)
Alkaline Phosphatase: 69 U/L (ref 39–117)
CO2: 29 mEq/L (ref 19–32)
GFR: 96.13 mL/min (ref >60.00)
Glucose, Bld: 88 mg/dL (ref 70–99)
Potassium: 4.5 mEq/L (ref 3.5–5.1)
Sodium: 138 mEq/L (ref 135–145)
Total Protein: 7.3 g/dL (ref 6.0–8.3)

## 2011-06-30 LAB — CBC WITH DIFFERENTIAL/PLATELET
Basophils Absolute: 0.2 10*3/uL — ABNORMAL HIGH (ref 0.0–0.1)
Eosinophils Relative: 2.3 % (ref 0.0–5.0)
Lymphocytes Relative: 31.1 % (ref 12.0–46.0)
Monocytes Relative: 7.5 % (ref 3.0–12.0)
Neutrophils Relative %: 56.6 % (ref 43.0–77.0)
Platelets: 237 10*3/uL (ref 150.0–400.0)
RDW: 13.1 % (ref 11.5–14.6)
WBC: 7.2 10*3/uL (ref 4.5–10.5)

## 2011-06-30 LAB — LIPID PANEL
Total CHOL/HDL Ratio: 4
VLDL: 30.6 mg/dL (ref 0.0–40.0)

## 2011-06-30 MED ORDER — ESOMEPRAZOLE MAGNESIUM 40 MG PO CPDR: 40.0000 mg | DELAYED_RELEASE_CAPSULE | Freq: Every day | ORAL | Status: DC

## 2011-06-30 MED ORDER — SERTRALINE HCL 50 MG PO TABS: 50.0000 mg | ORAL_TABLET | Freq: Every day | ORAL | Status: DC

## 2011-06-30 NOTE — Progress Notes (Signed)
Subjective:    Patient ID: Monica Madden, female    DOB: 08-03-1960, 51 y.o.   MRN: [id number]  HPI Here for health maintenance exam and to review chronic medical problems   All things consdiered, doing pretty well  Stress - brother had accident, broken bones (he used to run errands for her) Hard getting help Bad marriage- stays about the same  Is due for labs- is fasting today  Urine always smells strong  Drinks water all the time   1 mo ago - yeast symptoms after abx -that got better   More pain in back and joints as a rule- TS and LS  Meniscus tears, shoulder bone spur  A lot of aches and pains  Does get massages  Sleeps in a recliner  Is still morbidly obese This makes ambulation difficult and joint problems worse    bp is   114/70  Today No cp or palpitations or headaches or edema  No side effects to medicines  - ace    Chemistry      Component Value Date/Time   NA 139 07/01/2010 1219   K 4.7 07/01/2010 1219   CL 101 07/01/2010 1219   CO2 31 07/01/2010 1219   BUN 18 07/01/2010 1219   CREATININE 0.7 07/01/2010 1219      Component Value Date/Time   CALCIUM 9.4 07/01/2010 1219   ALKPHOS 76 07/01/2010 1219   AST 17 07/01/2010 1219   ALT 21 07/01/2010 1219   BILITOT 0.6 07/01/2010 1219     glucose nl   Hyperlipidemia Lab Results  Component Value Date   CHOL 204* 07/01/2010   CHOL 181 09/02/2009   CHOL 175 01/08/2009   Lab Results  Component Value Date   HDL 44.00 07/01/2010   HDL 38* 09/02/2009   HDL 38.80* 01/08/2009   Lab Results  Component Value Date   LDLCALC 116* 09/02/2009   LDLCALC 111* 01/08/2009   LDLCALC 135* 03/29/2008   Lab Results  Component Value Date   TRIG 128.0 07/01/2010   TRIG 137 09/02/2009   TRIG 124.0 01/08/2009   Lab Results  Component Value Date   CHOLHDL 5 07/01/2010   CHOLHDL 4.8 Ratio 09/02/2009   CHOLHDL 5 01/08/2009   Lab Results  Component Value Date   LDLDIRECT 147.0 07/01/2010     Wt is up 8 lb Morbidly obese Is  very difficult to exercise - hurts all over with DJD, back pain and leg pain  Dental surgery- could not eat vegetables for about a month  Getting back to that too  Likes to exercise in a pool- difficulty getting there    Gyn care Last pap and exam 10/2008 Wants to go back - needs a referral  That was her last period - is also having hot flashes also esp in last 2-3 mo   mammo 7/12- wants to set up next one -[place] Self exam -nl   For GERD - needs nexium- it works best (zantac now -not as good)  Pneumovax-- needs that due to asthma   colonosc 9/11   Patient Active Problem List  Diagnosis  . HYPERLIPIDEMIA  . OBESITY  . ANXIETY DEPRESSION  . MIGRAINE HEADACHE  . RETINITIS PIGMENTOSA  . ESSENTIAL HYPERTENSION  . VARICOSE VEINS, LOWER EXTREMITIES  . ALLERGIC RHINITIS  . ASTHMA  . GERD  . DYSPEPSIA  . INCI HERNIA WITHOUT MENTION OBSTRUCTION/GANGRENE  . CONSTIPATION  . IRRITABLE BOWEL SYNDROME  . DYSMENORRHEA, SEVERE  . CNTC DERMATITIS&OTH ECZEMA  DUE OTH CHEM PRODUCTS  . ARTHRALGIA  . KNEE PAIN, BILATERAL  . BACK PAIN, LUMBAR, CHRONIC  . MUSCLE SPASM, LUMBAR REGION  . LEG PAIN, LEFT  . DEPENDENT EDEMA, LEGS  . DIARRHEA  . INCONTINENCE, URGE  . FREQUENCY, URINARY  . Adverse effects of medication  . Other screening mammogram  . Right shoulder pain  . Abnormal urine odor  . Routine general medical examination at a health care facility  . Amenorrhea   Past Medical History  Diagnosis Date  . Allergy     allergic rhinitis  . Asthma   . Obesity   . Chronic back pain     OA of spine  . Retinitis pigmentosa     blind  . MRSA infection 2008    Right leg  . Ovarian cyst   . Hypertension 03/2008  . IBS (irritable bowel syndrome)   . GERD (gastroesophageal reflux disease)     Endo negative 02/2000  . Cataract     left cataract removal 12/2008   Past Surgical History  Procedure Date  . Hernia repair 05/2006    ruptured hernia repair after fall and then two  more surgeries within as many weeks for complications(05/2006) Umbilical hernia repair (06/1997)  . Appendectomy   . Cholecystectomy   . Cesarean section   . Bladder surgery 1968    age 49 ? dilitation  . Endometrial biopsy 07/1998    negative  . Cystoscopy 03/2008    negative// Dr. Wanda Plump urologist  . Eye surgery 12/2008    cataract removal left  . Knee arthroscopy w/ meniscal repair     left knne   History  Substance Use Topics  . Smoking status: Never Smoker   . Smokeless tobacco: Never Used  . Alcohol Use: Yes     twice a year   Family History  Problem Relation Age of Onset  . Depression Mother   . Diabetes Maternal Uncle   . Hypertension Maternal Grandmother   . Depression Brother     Depression secondary to MVA injuries   Allergies  Allergen Reactions  . Diclofenac Sodium     REACTION: allergic/ made asthma bad  . Etodolac     REACTION: GI  . Metronidazole     REACTION: GI side eff  . Sulfamethoxazole W-Trimethoprim   . Tolterodine Tartrate     REACTION: None Effective  . Tramadol Other (See Comments)    Caused elevated BP   Current Outpatient Prescriptions on File Prior to Visit  Medication Sig Dispense Refill  . albuterol (VENTOLIN HFA) 108 (90 BASE) MCG/ACT inhaler As needed for asthma attack       . ALPRAZolam (XANAX) 1 MG tablet Take 1 tablet (1 mg total) by mouth 3 (three) times daily as needed. Warning may sedate  60 tablet  3  . APAP-Isometheptene-Dichloral (EPIDRIN) 325-65-100 MG CAPS Take 1 every 6 hours as needed       . Ascorbic Acid (VITAMIN C) 1000 MG tablet Take 1,000 mg by mouth daily.        . Calcium Carbonate (CALCIUM 600) 1500 MG TABS Take as directed.       Marland Kitchen esomeprazole (NEXIUM) 40 MG capsule Take 40 mg by mouth daily before breakfast.        . fexofenadine (ALLEGRA) 180 MG tablet Take 180 mg by mouth daily.        . fluticasone (FLONASE) 50 MCG/ACT nasal spray 2 puffs in each nostril daily       .  Fluticasone-Salmeterol (ADVAIR  DISKUS) 100-50 MCG/DOSE AEPB Inhale 1 puff into the lungs every 12 (twelve) hours.        Marland Kitchen HYDROcodone-acetaminophen (NORCO) 5-325 MG per tablet Take 1 tablet by mouth every 6 (six) hours as needed.        Marland Kitchen ibuprofen (ADVIL,MOTRIN) 200 MG tablet Take 200 mg by mouth every 6 (six) hours as needed.        Marland Kitchen lisinopril (PRINIVIL,ZESTRIL) 5 MG tablet Take 1 tablet (5 mg total) by mouth every morning.  30 tablet  11  . montelukast (SINGULAIR) 10 MG tablet Take 10 mg by mouth daily.        . NON FORMULARY Cascade Diclof/Bacl/Bupiv/GABA (5) 3/2/1/6% cream. Instructions: apply 1-2 pumps to affected area 3-4 times daily.       . Omega 3-6-9 Fatty Acids (OMEGA 3-6-9 COMPLEX PO) Take one tablet by mouth daily       . sertraline (ZOLOFT) 50 MG tablet Take 1 tablet (50 mg total) by mouth daily.  30 tablet  11  . solifenacin (VESICARE) 5 MG tablet Take 1 tablet (5 mg total) by mouth daily. 1 by mouth every other day for bladder  30 tablet  11     Review of Systems Review of Systems  Constitutional: Negative for fever, appetite change, fatigue and unexpected weight change.  Eyes: Negative for pain and pos for baseline blindness .  Respiratory: Negative for cough and shortness of breath.   Cardiovascular: Negative for cp or palpitations    Gastrointestinal: Negative for nausea, diarrhea and constipation.  Genitourinary: Negative for urgency and frequency.  Skin: Negative for pallor or rash   MSK pos for aches and pains from arthritis  Neurological: Negative for weakness, light-headedness, numbness and headaches.  Hematological: Negative for adenopathy. Does not bruise/bleed easily.  Psychiatric/Behavioral: Negative for dysphoric mood. The patient is not nervous/anxious.         Objective:   Physical Exam  Constitutional: She appears well-developed and well-nourished. No distress.       Morbidly obese and well appearing   HENT:  Head: Normocephalic and atraumatic.  Right Ear: External ear normal.    Left Ear: External ear normal.  Nose: Nose normal.  Mouth/Throat: Oropharynx is clear and moist.  Eyes: Conjunctivae and EOM are normal. Pupils are equal, round, and reactive to light. No scleral icterus.       Pt is blind   Neck: Normal range of motion. Neck supple. No JVD present. Carotid bruit is not present. No thyromegaly present.  Cardiovascular: Normal rate, regular rhythm, normal heart sounds and intact distal pulses.  Exam reveals no gallop.   Pulmonary/Chest: Effort normal and breath sounds normal. No respiratory distress. She has no wheezes.  Abdominal: Soft. Bowel sounds are normal. She exhibits no distension, no abdominal bruit and no mass. There is no tenderness.  Musculoskeletal: Normal range of motion. She exhibits no edema and no tenderness.  Lymphadenopathy:    She has no cervical adenopathy.  Neurological: She is alert. She has normal reflexes. No cranial nerve deficit. She exhibits normal muscle tone. Coordination normal.  Skin: Skin is warm and dry. No rash noted. No erythema. No pallor.  Psychiatric: She has a normal mood and affect.       Cheerful and talkative           Assessment & Plan:

## 2011-06-30 NOTE — Patient Instructions (Signed)
Pneumonia vaccine today Give urine specimen on way out to check that  We will schedule mammogram at check out  We will refer to gyn at check out

## 2011-07-02 ENCOUNTER — Encounter: Payer: 59 | Admitting: Family Medicine

## 2011-07-03 NOTE — Assessment & Plan Note (Signed)
Morbidly obese- aff her ADLs at this point Discussed how this problem influences overall health and the risks it imposes  Reviewed plan for weight loss with lower calorie diet (via better food choices and also portion control or program like weight watchers) and exercise building up to or more than 30 minutes 5 days per week including some aerobic activity

## 2011-07-03 NOTE — Assessment & Plan Note (Signed)
Diet controlled Disc goals for lipids and reasons to control them Rev labs with pt- from last year, drawing today for current lipid prof Rev low sat fat diet in detail d

## 2011-07-03 NOTE — Assessment & Plan Note (Signed)
Reviewed health habits including diet and exercise and skin cancer prevention Also reviewed health mt list, fam hx and immunizations   Labs today for wellness Ref to gyn for her annual exam also  Disc imp of wt loss

## 2011-07-03 NOTE — Assessment & Plan Note (Signed)
ua nl today Enc to drink more water and update if pain/ frequency or other symptoms develop

## 2011-07-03 NOTE — Assessment & Plan Note (Signed)
bp in fair control at this time  No changes needed  Disc lifstyle change with low sodium diet and exercise   Labs today 

## 2011-07-03 NOTE — Assessment & Plan Note (Signed)
Although this could be from menopause- also at high risk of endometrial hyperplasia due to morbid obesity  Ref to gyn for this and annual exam

## 2011-07-03 NOTE — Assessment & Plan Note (Signed)
Scheduled this  Will have breast exam with gyn- ref done Reminded to continue self exams monthly

## 2011-07-20 ENCOUNTER — Ambulatory Visit (INDEPENDENT_AMBULATORY_CARE_PROVIDER_SITE_OTHER): Payer: BC Managed Care – PPO | Admitting: Family Medicine

## 2011-07-20 DIAGNOSIS — N951 Menopausal and female climacteric states: Secondary | ICD-10-CM

## 2011-07-20 DIAGNOSIS — R232 Flushing: Secondary | ICD-10-CM

## 2011-07-20 DIAGNOSIS — N390 Urinary tract infection, site not specified: Secondary | ICD-10-CM

## 2011-07-20 DIAGNOSIS — Z01419 Encounter for gynecological examination (general) (routine) without abnormal findings: Secondary | ICD-10-CM

## 2011-07-20 LAB — POCT URINALYSIS DIPSTICK
Bilirubin, UA: NEGATIVE
Glucose, UA: NEGATIVE
Ketones, UA: NEGATIVE
Nitrite, UA: NEGATIVE
pH, UA: 5

## 2011-07-20 NOTE — Progress Notes (Signed)
  Subjective:    Patient ID: Monica Madden, female    DOB: April 05, 1960, 51 y.o.   MRN: [id number]  HPI  Returns today with need for Pap smear and  clinical breast exam. She had a recent office visit with her primary care provider there was an annual physical exam including blood work. She's been essentially amenorrheic since endometrial ablation. She has had occasional spotting. She has been having hot flashes recently. She is without significant complaints.  Review of Systems  Constitutional: Negative for fever and chills.  HENT: Negative for congestion and rhinorrhea.   Genitourinary: Positive for frequency and pelvic pain. Urgency: cramping.  Musculoskeletal: Positive for back pain.       Objective:   Physical Exam  Vitals reviewed. Constitutional: She appears well-developed and well-nourished.  HENT:  Head: Normocephalic and atraumatic.  Neck: Neck supple.  Cardiovascular: Normal rate.   Pulmonary/Chest: Effort normal. Right breast exhibits no inverted nipple and no mass. Left breast exhibits no inverted nipple and no mass.  Abdominal: Soft.  Genitourinary:       Normal external female genitalia, BUS is normal. Vagina is pink and rugated. Cervix is multiparous, without lesion. Bimanual exam reveals no cervical motion tenderness uterus and ovaries cannot be well-defined secondary to body habitus.          Assessment & Plan:   1. Urinary tract infection, site not specified  POCT urinalysis dipstick  2. Routine gynecological examination  Cytology - PAP  3. Hot flashes  Follicle stimulating hormone

## 2011-07-20 NOTE — Progress Notes (Signed)
Back pain, strong odor to urine, hot flashes, anxiety levels are worst.

## 2011-07-20 NOTE — Patient Instructions (Signed)
Menopause Menopause is the normal time of life when menstrual periods stop completely. Menopause is complete when you have missed 12 consecutive menstrual periods. It usually occurs between the ages of 48 to 55, with an average age of 51. Very rarely does a woman develop menopause before 49 years old. At menopause, your ovaries stop producing the female hormones, estrogen and progesterone. This can cause undesirable symptoms and also affect your health. Sometimes the symptoms may occur 4 to 5 years before the menopause begins. There is no relationship between menopause and:  Oral contraceptives.   Number of children you had.   Race.   The age your menstrual periods started (menarche).  Heavy smokers and very thin women may develop menopause earlier in life. CAUSES  The ovaries stop producing the female hormones estrogen and progesterone.   Other causes include:   Surgery to remove both ovaries.   The ovaries stop functioning for no known reason.   Tumors of the pituitary gland in the brain.   Medical disease that affects the ovaries and hormone production.   Radiation treatment to the abdomen or pelvis.   Chemotherapy that affects the ovaries.  SYMPTOMS   Hot flashes.   Night sweats.   Decrease in sex drive.   Vaginal dryness and thinning of the vagina causing painful intercourse.   Dryness of the skin and developing wrinkles.   Headaches.   Tiredness.   Irritability.   Memory problems.   Weight gain.   Bladder infections.   Hair growth of the face and chest.   Infertility.  More serious symptoms include:  Loss of bone (osteoporosis) causing breaks (fractures).   Depression.   Hardening and narrowing of the arteries (atherosclerosis) causing heart attacks and strokes.  DIAGNOSIS   When the menstrual periods have stopped for 12 straight months.   Physical exam.   Hormone studies of the blood.  TREATMENT  There are many treatment choices and nearly  as many questions about them. The decisions to treat or not to treat menopausal changes is an individual choice made with your caregiver. Your caregiver can discuss the treatments with you. Together, you can decide which treatment will work best for you. Your treatment choices may include:   Hormone therapy (estorgen and progesterone).   Non-hormonal medications.   Treating the individual symptoms with medication (for example antidepressants for depression).   Herbal medications that may help specific symptoms.   Counseling by a psychiatrist or psychologist.   Group therapy.   Lifestyle changes including:   Eating healthy.   Regular exercise.   Limiting caffeine and alcohol.   Stress management and meditation.   No treatment.  HOME CARE INSTRUCTIONS   Take the medication your caregiver gives you as directed.   Get plenty of sleep and rest.   Exercise regularly.   Eat a diet that contains calcium (good for the bones) and soy products (acts like estrogen hormone).   Avoid alcoholic beverages.   Do not smoke.   If you have hot flashes, dress in layers.   Take supplements, calcium and vitamin D to strengthen bones.   You can use over-the-counter lubricants or moisturizers for vaginal dryness.   Group therapy is sometimes very helpful.   Acupuncture may be helpful in some cases.  SEEK MEDICAL CARE IF:   You are not sure you are in menopause.   You are having menopausal symptoms and need advice and treatment.   You are still having menstrual periods after age 55.     You have pain with intercourse.   Menopause is complete (no menstrual period for 12 months) and you develop vaginal bleeding.   You need a referral to a specialist (gynecologist, psychiatrist or psychologist) for treatment.  SEEK IMMEDIATE MEDICAL CARE IF:   You have severe depression.   You have excessive vaginal bleeding.   You fell and think you have a broken bone.   You have pain when you  urinate.   You develop leg or chest pain.   You have a fast pounding heart beat (palpitations).   You have severe headaches.   You develop vision problems.   You feel a lump in your breast.   You have abdominal pain or severe indigestion.  Document Released: 03/27/2003 Document Revised: 12/24/2010 Document Reviewed: 11/02/2007 [facility] Patient Information 2012 [place], [place].Preventive Care for Adults, Female A healthy lifestyle and preventive care can promote health and wellness. Preventive health guidelines for women include the following key practices.  A routine yearly physical is a good way to check with your caregiver about your health and preventive screening. It is a chance to share any concerns and updates on your health, and to receive a thorough exam.   Visit your dentist for a routine exam and preventive care every 6 months. Brush your teeth twice a day and floss once a day. Good oral hygiene prevents tooth decay and gum disease.   The frequency of eye exams is based on your age, health, family medical history, use of contact lenses, and other factors. Follow your caregiver's recommendations for frequency of eye exams.   Eat a healthy diet. Foods like vegetables, fruits, whole grains, low-fat dairy products, and lean protein foods contain the nutrients you need without too many calories. Decrease your intake of foods high in solid fats, added sugars, and salt. Eat the right amount of calories for you.Get information about a proper diet from your caregiver, if necessary.   Regular physical exercise is one of the most important things you can do for your health. Most adults should get at least 150 minutes of moderate-intensity exercise (any activity that increases your heart rate and causes you to sweat) each week. In addition, most adults need muscle-strengthening exercises on 2 or more days a week.   Maintain a healthy weight. The body mass index (BMI) is a screening tool to  identify possible weight problems. It provides an estimate of body fat based on height and weight. Your caregiver can help determine your BMI, and can help you achieve or maintain a healthy weight.For adults 20 years and older:   A BMI below 18.5 is considered underweight.   A BMI of 18.5 to 24.9 is normal.   A BMI of 25 to 29.9 is considered overweight.   A BMI of 30 and above is considered obese.   Maintain normal blood lipids and cholesterol levels by exercising and minimizing your intake of saturated fat. Eat a balanced diet with plenty of fruit and vegetables. Blood tests for lipids and cholesterol should begin at age 14 and be repeated every 5 years. If your lipid or cholesterol levels are high, you are over 50, or you are at high risk for heart disease, you may need your cholesterol levels checked more frequently.Ongoing high lipid and cholesterol levels should be treated with medicines if diet and exercise are not effective.   If you smoke, find out from your caregiver how to quit. If you do not use tobacco, do not start.   If you  are pregnant, do not drink alcohol. If you are breastfeeding, be very cautious about drinking alcohol. If you are not pregnant and choose to drink alcohol, do not exceed 1 drink per day. One drink is considered to be 12 ounces (355 mL) of beer, 5 ounces (148 mL) of wine, or 1.5 ounces (44 mL) of liquor.   Avoid use of street drugs. Do not share needles with anyone. Ask for help if you need support or instructions about stopping the use of drugs.   High blood pressure causes heart disease and increases the risk of stroke. Your blood pressure should be checked at least every 1 to 2 years. Ongoing high blood pressure should be treated with medicines if weight loss and exercise are not effective.   If you are 32 to 49 years old, ask your caregiver if you should take aspirin to prevent strokes.   Diabetes screening involves taking a blood sample to check your  fasting blood sugar level. This should be done once every 3 years, after age 88, if you are within normal weight and without risk factors for diabetes. Testing should be considered at a younger age or be carried out more frequently if you are overweight and have at least 1 risk factor for diabetes.   Breast cancer screening is essential preventive care for women. You should practice "breast self-awareness." This means understanding the normal appearance and feel of your breasts and may include breast self-examination. Any changes detected, no matter how small, should be reported to a caregiver. Women in their 78s and 30s should have a clinical breast exam (CBE) by a caregiver as part of a regular health exam every 1 to 3 years. After age 10, women should have a CBE every year. Starting at age 44, women should consider having a mammography (breast X-ray test) every year. Women who have a family history of breast cancer should talk to their caregiver about genetic screening. Women at a high risk of breast cancer should talk to their caregivers about having magnetic resonance imaging (MRI) and a mammography every year.   The Pap test is a screening test for cervical cancer. A Pap test can show cell changes on the cervix that might become cervical cancer if left untreated. A Pap test is a procedure in which cells are obtained and examined from the lower end of the uterus (cervix).   Women should have a Pap test starting at age 58.   Between ages 81 and 29, Pap tests should be repeated every 2 years.   Beginning at age 73, you should have a Pap test every 3 years as long as the past 3 Pap tests have been normal.   Some women have medical problems that increase the chance of getting cervical cancer. Talk to your caregiver about these problems. It is especially important to talk to your caregiver if a new problem develops soon after your last Pap test. In these cases, your caregiver may recommend more frequent  screening and Pap tests.   The above recommendations are the same for women who have or have not gotten the vaccine for human papillomavirus (HPV).   If you had a hysterectomy for a problem that was not cancer or a condition that could lead to cancer, then you no longer need Pap tests. Even if you no longer need a Pap test, a regular exam is a good idea to make sure no other problems are starting.   If you are between ages 40  and 70, and you have had normal Pap tests going back 10 years, you no longer need Pap tests. Even if you no longer need a Pap test, a regular exam is a good idea to make sure no other problems are starting.   If you have had past treatment for cervical cancer or a condition that could lead to cancer, you need Pap tests and screening for cancer for at least 20 years after your treatment.   If Pap tests have been discontinued, risk factors (such as a new sexual partner) need to be reassessed to determine if screening should be resumed.   The HPV test is an additional test that may be used for cervical cancer screening. The HPV test looks for the virus that can cause the cell changes on the cervix. The cells collected during the Pap test can be tested for HPV. The HPV test could be used to screen women aged 81 years and older, and should be used in women of any age who have unclear Pap test results. After the age of 37, women should have HPV testing at the same frequency as a Pap test.   Colorectal cancer can be detected and often prevented. Most routine colorectal cancer screening begins at the age of 14 and continues through age 53. However, your caregiver may recommend screening at an earlier age if you have risk factors for colon cancer. On a yearly basis, your caregiver may provide home test kits to check for hidden blood in the stool. Use of a small camera at the end of a tube, to directly examine the colon (sigmoidoscopy or colonoscopy), can detect the earliest forms of  colorectal cancer. Talk to your caregiver about this at age 49, when routine screening begins. Direct examination of the colon should be repeated every 5 to 10 years through age 57, unless early forms of pre-cancerous polyps or small growths are found.   Hepatitis C blood testing is recommended for all people born from [birth year] through 1965 and any individual with known risks for hepatitis C.   Practice safe sex. Use condoms and avoid high-risk sexual practices to reduce the spread of sexually transmitted infections (STIs). STIs include gonorrhea, chlamydia, syphilis, trichomonas, herpes, HPV, and human immunodeficiency virus (HIV). Herpes, HIV, and HPV are viral illnesses that have no cure. They can result in disability, cancer, and death. Sexually active women aged 29 and younger should be checked for chlamydia. Older women with new or multiple partners should also be tested for chlamydia. Testing for other STIs is recommended if you are sexually active and at increased risk.   Osteoporosis is a disease in which the bones lose minerals and strength with aging. This can result in serious bone fractures. The risk of osteoporosis can be identified using a bone density scan. Women ages 42 and over and women at risk for fractures or osteoporosis should discuss screening with their caregivers. Ask your caregiver whether you should take a calcium supplement or vitamin D to reduce the rate of osteoporosis.   Menopause can be associated with physical symptoms and risks. Hormone replacement therapy is available to decrease symptoms and risks. You should talk to your caregiver about whether hormone replacement therapy is right for you.   Use sunscreen with sun protection factor (SPF) of 30 or more. Apply sunscreen liberally and repeatedly throughout the day. You should seek shade when your shadow is shorter than you. Protect yourself by wearing long sleeves, pants, a wide-brimmed hat, and sunglasses year  round,  whenever you are outdoors.   Once a month, do a whole body skin exam, using a mirror to look at the skin on your back. Notify your caregiver of new moles, moles that have irregular borders, moles that are larger than a pencil eraser, or moles that have changed in shape or color.   Stay current with required immunizations.   Influenza. You need a dose every fall (or winter). The composition of the flu vaccine changes each year, so being vaccinated once is not enough.   Pneumococcal polysaccharide. You need 1 to 2 doses if you smoke cigarettes or if you have certain chronic medical conditions. You need 1 dose at age 38 (or older) if you have never been vaccinated.   Tetanus, diphtheria, pertussis (Tdap, Td). Get 1 dose of Tdap vaccine if you are younger than age 53, are over 55 and have contact with an infant, are a Research scientist (physical sciences), are pregnant, or simply want to be protected from whooping cough. After that, you need a Td booster dose every 10 years. Consult your caregiver if you have not had at least 3 tetanus and diphtheria-containing shots sometime in your life or have a deep or dirty wound.   HPV. You need this vaccine if you are a woman age 60 or younger. The vaccine is given in 3 doses over 6 months.   Measles, mumps, rubella (MMR). You need at least 1 dose of MMR if you were born in 1957 or later. You may also need a second dose.   Meningococcal. If you are age 29 to 62 and a first-year college student living in a residence hall, or have one of several medical conditions, you need to get vaccinated against meningococcal disease. You may also need additional booster doses.   Zoster (shingles). If you are age 60 or older, you should get this vaccine.   Varicella (chickenpox). If you have never had chickenpox or you were vaccinated but received only 1 dose, talk to your caregiver to find out if you need this vaccine.   Hepatitis A. You need this vaccine if you have a specific risk factor  for hepatitis A virus infection or you simply wish to be protected from this disease. The vaccine is usually given as 2 doses, 6 to 18 months apart.   Hepatitis B. You need this vaccine if you have a specific risk factor for hepatitis B virus infection or you simply wish to be protected from this disease. The vaccine is given in 3 doses, usually over 6 months.  Preventive Services / Frequency Ages 84 to 72  Blood pressure check.** / Every 1 to 2 years.   Lipid and cholesterol check.** / Every 5 years beginning at age 4.   Clinical breast exam.** / Every 3 years for women in their 64s and 30s.   Pap test.** / Every 2 years from ages 14 through 22. Every 3 years starting at age 17 through age 58 or 94 with a history of 3 consecutive normal Pap tests.   HPV screening.** / Every 3 years from ages 24 through ages 12 to 73 with a history of 3 consecutive normal Pap tests.   Hepatitis C blood test.** / For any individual with known risks for hepatitis C.   Skin self-exam. / Monthly.   Influenza immunization.** / Every year.   Pneumococcal polysaccharide immunization.** / 1 to 2 doses if you smoke cigarettes or if you have certain chronic medical conditions.   Tetanus, diphtheria,  pertussis (Tdap, Td) immunization. / A one-time dose of Tdap vaccine. After that, you need a Td booster dose every 10 years.   HPV immunization. / 3 doses over 6 months, if you are 13 and younger.   Measles, mumps, rubella (MMR) immunization. / You need at least 1 dose of MMR if you were born in 1957 or later. You may also need a second dose.   Meningococcal immunization. / 1 dose if you are age 23 to 21 and a first-year college student living in a residence hall, or have one of several medical conditions, you need to get vaccinated against meningococcal disease. You may also need additional booster doses.   Varicella immunization.** / Consult your caregiver.   Hepatitis A immunization.** / Consult your  caregiver. 2 doses, 6 to 18 months apart.   Hepatitis B immunization.** / Consult your caregiver. 3 doses usually over 6 months.  Ages 88 to 64  Blood pressure check.** / Every 1 to 2 years.   Lipid and cholesterol check.** / Every 5 years beginning at age 25.   Clinical breast exam.** / Every year after age 36.   Mammogram.** / Every year beginning at age 65 and continuing for as long as you are in good health. Consult with your caregiver.   Pap test.** / Every 3 years starting at age 22 through age 106 or 74 with a history of 3 consecutive normal Pap tests.   HPV screening.** / Every 3 years from ages 34 through ages 48 to 39 with a history of 3 consecutive normal Pap tests.   Fecal occult blood test (FOBT) of stool. / Every year beginning at age 10 and continuing until age 81. You may not need to do this test if you get a colonoscopy every 10 years.   Flexible sigmoidoscopy or colonoscopy.** / Every 5 years for a flexible sigmoidoscopy or every 10 years for a colonoscopy beginning at age 62 and continuing until age 27.   Hepatitis C blood test.** / For all people born from [birth year] through 1965 and any individual with known risks for hepatitis C.   Skin self-exam. / Monthly.   Influenza immunization.** / Every year.   Pneumococcal polysaccharide immunization.** / 1 to 2 doses if you smoke cigarettes or if you have certain chronic medical conditions.   Tetanus, diphtheria, pertussis (Tdap, Td) immunization.** / A one-time dose of Tdap vaccine. After that, you need a Td booster dose every 10 years.   Measles, mumps, rubella (MMR) immunization. / You need at least 1 dose of MMR if you were born in 1957 or later. You may also need a second dose.   Varicella immunization.** / Consult your caregiver.   Meningococcal immunization.** / Consult your caregiver.   Hepatitis A immunization.** / Consult your caregiver. 2 doses, 6 to 18 months apart.   Hepatitis B immunization.** / Consult  your caregiver. 3 doses, usually over 6 months.  Ages 3 and over  Blood pressure check.** / Every 1 to 2 years.   Lipid and cholesterol check.** / Every 5 years beginning at age 50.   Clinical breast exam.** / Every year after age 87.   Mammogram.** / Every year beginning at age 20 and continuing for as long as you are in good health. Consult with your caregiver.   Pap test.** / Every 3 years starting at age 43 through age 49 or 79 with a 3 consecutive normal Pap tests. Testing can be stopped between 65 and 70 with  3 consecutive normal Pap tests and no abnormal Pap or HPV tests in the past 10 years.   HPV screening.** / Every 3 years from ages 80 through ages 53 or 2 with a history of 3 consecutive normal Pap tests. Testing can be stopped between 65 and 70 with 3 consecutive normal Pap tests and no abnormal Pap or HPV tests in the past 10 years.   Fecal occult blood test (FOBT) of stool. / Every year beginning at age 97 and continuing until age 98. You may not need to do this test if you get a colonoscopy every 10 years.   Flexible sigmoidoscopy or colonoscopy.** / Every 5 years for a flexible sigmoidoscopy or every 10 years for a colonoscopy beginning at age 63 and continuing until age 48.   Hepatitis C blood test.** / For all people born from [birth year] through 1965 and any individual with known risks for hepatitis C.   Osteoporosis screening.** / A one-time screening for women ages 44 and over and women at risk for fractures or osteoporosis.   Skin self-exam. / Monthly.   Influenza immunization.** / Every year.   Pneumococcal polysaccharide immunization.** / 1 dose at age 102 (or older) if you have never been vaccinated.   Tetanus, diphtheria, pertussis (Tdap, Td) immunization. / A one-time dose of Tdap vaccine if you are over 65 and have contact with an infant, are a Research scientist (physical sciences), or simply want to be protected from whooping cough. After that, you need a Td booster dose every 10  years.   Varicella immunization.** / Consult your caregiver.   Meningococcal immunization.** / Consult your caregiver.   Hepatitis A immunization.** / Consult your caregiver. 2 doses, 6 to 18 months apart.   Hepatitis B immunization.** / Check with your caregiver. 3 doses, usually over 6 months.  ** Family history and personal history of risk and conditions may change your caregiver's recommendations. Document Released: 03/02/2001 Document Revised: 12/24/2010 Document Reviewed: 06/01/2010 [facility] Patient Information 2012 [place], [place].

## 2011-07-21 ENCOUNTER — Telehealth: Payer: Self-pay | Admitting: Gynecology

## 2011-07-21 NOTE — Telephone Encounter (Signed)
Call patient . Message left on voicemail of normal labs. 

## 2011-07-30 ENCOUNTER — Other Ambulatory Visit: Payer: Self-pay | Admitting: *Deleted

## 2011-07-30 MED ORDER — LISINOPRIL 5 MG PO TABS: 5.0000 mg | ORAL_TABLET | ORAL | Status: DC

## 2011-07-30 MED ORDER — SOLIFENACIN SUCCINATE 5 MG PO TABS: 5.0000 mg | ORAL_TABLET | Freq: Every day | ORAL | Status: DC

## 2011-08-11 ENCOUNTER — Ambulatory Visit: Payer: Self-pay | Admitting: Family Medicine

## 2011-08-19 ENCOUNTER — Encounter: Payer: Self-pay | Admitting: Family Medicine

## 2011-08-30 ENCOUNTER — Other Ambulatory Visit: Payer: Self-pay | Admitting: *Deleted

## 2011-08-30 MED ORDER — ALPRAZOLAM 1 MG PO TABS: 1.0000 mg | ORAL_TABLET | Freq: Three times a day (TID) | ORAL | Status: DC | PRN

## 2011-08-30 NOTE — Telephone Encounter (Signed)
Medication phoned to pharmacy.  

## 2011-08-30 NOTE — Telephone Encounter (Signed)
Px written for call in   

## 2011-12-28 ENCOUNTER — Other Ambulatory Visit: Payer: Self-pay | Admitting: Family Medicine

## 2011-12-28 MED ORDER — ALPRAZOLAM 1 MG PO TABS: 1.0000 mg | ORAL_TABLET | Freq: Three times a day (TID) | ORAL | Status: DC | PRN

## 2011-12-28 NOTE — Telephone Encounter (Signed)
Px written for call in   

## 2011-12-28 NOTE — Telephone Encounter (Signed)
Received faxed refill request, ok to refill? 

## 2011-12-28 NOTE — Telephone Encounter (Signed)
Rx called in as prescribed 

## 2012-04-26 ENCOUNTER — Other Ambulatory Visit: Payer: Self-pay | Admitting: *Deleted

## 2012-04-26 MED ORDER — ALPRAZOLAM 1 MG PO TABS: 1.0000 mg | ORAL_TABLET | Freq: Three times a day (TID) | ORAL | Status: DC | PRN

## 2012-04-26 NOTE — Telephone Encounter (Signed)
Px written for call in   

## 2012-04-26 NOTE — Telephone Encounter (Signed)
Last filled 03-27-12

## 2012-04-27 NOTE — Telephone Encounter (Signed)
Rx called in as prescribed 

## 2012-06-27 ENCOUNTER — Other Ambulatory Visit: Payer: Self-pay | Admitting: *Deleted

## 2012-06-27 MED ORDER — ESOMEPRAZOLE MAGNESIUM 40 MG PO CPDR: 40.0000 mg | DELAYED_RELEASE_CAPSULE | Freq: Every day | ORAL | Status: DC

## 2012-06-27 MED ORDER — SERTRALINE HCL 50 MG PO TABS: 50.0000 mg | ORAL_TABLET | Freq: Every day | ORAL | Status: DC

## 2012-06-27 NOTE — Telephone Encounter (Signed)
done

## 2012-06-27 NOTE — Telephone Encounter (Signed)
Received faxed refill request from pharmacy. Patient has upcoming appointment scheduled for 07/31/12. Is it okay to refill medications?

## 2012-06-27 NOTE — Telephone Encounter (Signed)
Please refill until visit-thanks

## 2012-07-20 ENCOUNTER — Encounter: Payer: Self-pay | Admitting: *Deleted

## 2012-07-23 ENCOUNTER — Telehealth: Payer: Self-pay | Admitting: Family Medicine

## 2012-07-23 DIAGNOSIS — Z0000 Encounter for general adult medical examination without abnormal findings: Secondary | ICD-10-CM

## 2012-07-23 DIAGNOSIS — E785 Hyperlipidemia, unspecified: Secondary | ICD-10-CM

## 2012-07-23 NOTE — Telephone Encounter (Signed)
Message copied by Judy Pimple on Sun Jul 23, 2012  8:48 PM ------      Message from: Alvina Chou      Created: Tue Jul 18, 2012  2:17 PM      Regarding: Lab orders for Monday, 7.7.14       Patient is scheduled for CPX labs, please order future labs, Thanks , Terri       ------

## 2012-07-24 ENCOUNTER — Other Ambulatory Visit (INDEPENDENT_AMBULATORY_CARE_PROVIDER_SITE_OTHER): Payer: BC Managed Care – PPO

## 2012-07-24 DIAGNOSIS — Z0000 Encounter for general adult medical examination without abnormal findings: Secondary | ICD-10-CM

## 2012-07-24 DIAGNOSIS — E785 Hyperlipidemia, unspecified: Secondary | ICD-10-CM

## 2012-07-24 LAB — CBC WITH DIFFERENTIAL/PLATELET
Basophils Absolute: 0.1 10*3/uL (ref 0.0–0.1)
Eosinophils Relative: 1.8 % (ref 0.0–5.0)
HCT: 39.5 % (ref 36.0–46.0)
Hemoglobin: 13.3 g/dL (ref 12.0–15.0)
Lymphocytes Relative: 28.8 % (ref 12.0–46.0)
Lymphs Abs: 2.1 10*3/uL (ref 0.7–4.0)
Monocytes Relative: 6.1 % (ref 3.0–12.0)
Neutro Abs: 4.6 10*3/uL (ref 1.4–7.7)
Platelets: 240 10*3/uL (ref 150.0–400.0)
WBC: 7.4 10*3/uL (ref 4.5–10.5)

## 2012-07-24 LAB — COMPREHENSIVE METABOLIC PANEL
ALT: 15 U/L (ref 0–35)
Albumin: 4 g/dL (ref 3.5–5.2)
CO2: 34 mEq/L — ABNORMAL HIGH (ref 19–32)
Calcium: 9.4 mg/dL (ref 8.4–10.5)
Chloride: 100 mEq/L (ref 96–112)
GFR: 91.12 mL/min (ref >60.00)
Glucose, Bld: 91 mg/dL (ref 70–99)
Sodium: 136 mEq/L (ref 135–145)
Total Bilirubin: 0.5 mg/dL (ref 0.3–1.2)
Total Protein: 7.1 g/dL (ref 6.0–8.3)

## 2012-07-24 LAB — LIPID PANEL
Cholesterol: 197 mg/dL (ref 0–200)
VLDL: 31 mg/dL (ref 0.0–40.0)

## 2012-07-25 LAB — TSH: TSH: 1.12 u[IU]/mL (ref 0.35–5.50)

## 2012-07-26 ENCOUNTER — Encounter: Payer: Self-pay | Admitting: Family Medicine

## 2012-07-27 ENCOUNTER — Other Ambulatory Visit: Payer: Self-pay | Admitting: *Deleted

## 2012-07-27 MED ORDER — LISINOPRIL 5 MG PO TABS: 5.0000 mg | ORAL_TABLET | ORAL | Status: DC

## 2012-07-31 ENCOUNTER — Encounter: Payer: Self-pay | Admitting: Family Medicine

## 2012-07-31 ENCOUNTER — Ambulatory Visit (INDEPENDENT_AMBULATORY_CARE_PROVIDER_SITE_OTHER): Payer: BC Managed Care – PPO | Admitting: Family Medicine

## 2012-07-31 VITALS — BP 122/68 | HR 75 | Temp 98.6°F | Ht 61.0 in | Wt 281.5 lb

## 2012-07-31 DIAGNOSIS — I10 Essential (primary) hypertension: Secondary | ICD-10-CM

## 2012-07-31 DIAGNOSIS — E785 Hyperlipidemia, unspecified: Secondary | ICD-10-CM

## 2012-07-31 DIAGNOSIS — Z0000 Encounter for general adult medical examination without abnormal findings: Secondary | ICD-10-CM

## 2012-07-31 DIAGNOSIS — E669 Obesity, unspecified: Secondary | ICD-10-CM

## 2012-07-31 NOTE — Patient Instructions (Addendum)
Keep up the good work with healthy diet and exercise  I'm glad you are doing well  Avoid red meat/ fried foods/ egg yolks/ fatty breakfast meats/ butter, cheese and high fat dairy/ and shellfish  - for cholesterol

## 2012-07-31 NOTE — Progress Notes (Signed)
Subjective:    Patient ID: Monica Madden, female    DOB: 10-08-1960, 52 y.o.   MRN: [id number]  HPI Here for health maintenance exam and to review chronic medical problems    Is doing well overall  Has tried polarity therapy (also massage) - it is helping her overeating issues / emotional issues / examining feelings/ doing some writing and meditation  A great experience   She feels good emotionally and plans to cut her zoloft to 25 mg   Wt is down 7 lb with bmi of 53 Has a much better hold of her emotional eating- choosing much better foods  Still too much cheese   Mammogram 7/13- goes to [facility] - will make her own appt  Self exam- no lumps - has fibrocystic breasts   Pap with gyn was 7/13 Menopause suspected via fsh/ LH at the time Has not had a period in a year  Only 2 menses since her endo bx  No bleeding and no problems  Hot flashes and night sweats - she would not consider estrogen    Td 7/06 Flu vaccine did get one this past season  colonosc 9/11  Hyperlipidemia Lab Results  Component Value Date   CHOL 197 07/24/2012   CHOL 199 06/30/2011   CHOL 204* 07/01/2010   Lab Results  Component Value Date   HDL 43.90 07/24/2012   HDL 49.80 06/30/2011   HDL 40.98 07/01/2010   Lab Results  Component Value Date   LDLCALC 122* 07/24/2012   LDLCALC 119* 06/30/2011   LDLCALC 116* 09/02/2009   Lab Results  Component Value Date   TRIG 155.0* 07/24/2012   TRIG 153.0* 06/30/2011   TRIG 128.0 07/01/2010   Lab Results  Component Value Date   CHOLHDL 4 07/24/2012   CHOLHDL 4 06/30/2011   CHOLHDL 5 07/01/2010   Lab Results  Component Value Date   LDLDIRECT 147.0 07/01/2010   she eats too much cheese  She is more active- walking and stretching - does have chronic pain  Has improved over time   bp is stable today  No cp or palpitations or headaches or edema  No side effects to medicines  BP Readings from Last 3 Encounters:  07/31/12 122/68  06/30/11 114/70  01/01/11 118/74      Her drug screen showed THC - she did smoke marijuana once - is not a regular user   Patient Active Problem List   Diagnosis Date Noted  . Abnormal urine odor 06/30/2011  . Routine general medical examination at a health care facility 06/30/2011  . Amenorrhea 06/30/2011  . Adverse effects of medication 07/01/2010  . Right shoulder pain 07/01/2010  . DYSPEPSIA 09/26/2009  . IRRITABLE BOWEL SYNDROME 08/07/2009  . KNEE PAIN, BILATERAL 08/07/2009  . CONSTIPATION 01/15/2009  . LEG PAIN, LEFT 10/09/2008  . HYPERLIPIDEMIA 05/09/2008  . ESSENTIAL HYPERTENSION 03/29/2008  . 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 06/05/2007  . INCI HERNIA WITHOUT MENTION OBSTRUCTION/GANGRENE 05/12/2007  . FREQUENCY, URINARY 05/12/2007  . ARTHRALGIA 11/03/2006  . DEPENDENT EDEMA, LEGS 11/03/2006  . OBESITY 07/15/2006  . ANXIETY DEPRESSION 07/15/2006  . MIGRAINE HEADACHE 07/15/2006  . RETINITIS PIGMENTOSA 07/15/2006  . VARICOSE VEINS, LOWER EXTREMITIES 07/15/2006  . ALLERGIC RHINITIS 07/15/2006  . ASTHMA 07/15/2006  . GERD 07/15/2006  . BACK PAIN, LUMBAR, CHRONIC 07/15/2006  . INCONTINENCE, URGE 07/15/2006   Past Medical History  Diagnosis Date  . Allergy     allergic rhinitis  . Asthma   .  Obesity   . Chronic back pain     OA of spine  . Retinitis pigmentosa     blind  . MRSA infection 2008    Right leg  . Ovarian cyst   . Hypertension 03/2008  . IBS (irritable bowel syndrome)   . GERD (gastroesophageal reflux disease)     Endo negative 02/2000  . Cataract     left cataract removal 12/2008   Past Surgical History  Procedure Laterality Date  . Hernia repair  05/2006    ruptured hernia repair after fall and then two more surgeries within as many weeks for complications(05/2006) Umbilical hernia repair (06/1997)  . Appendectomy    . Cholecystectomy    . Cesarean section    . Bladder surgery  1968    age 25 ? dilitation  . Endometrial biopsy  07/1998    negative  .  Cystoscopy  03/2008    negative// Dr. Wanda Plump urologist  . Eye surgery  12/2008    cataract removal left  . Knee arthroscopy w/ meniscal repair      left knne   History  Substance Use Topics  . Smoking status: Never Smoker   . Smokeless tobacco: Never Used  . Alcohol Use: Yes     Comment: twice a year   Family History  Problem Relation Age of Onset  . Depression Mother   . Diabetes Maternal Uncle   . Hypertension Maternal Grandmother   . Depression Brother     Depression secondary to MVA injuries   Allergies  Allergen Reactions  . Diclofenac Sodium     REACTION: allergic/ made asthma bad  . Etodolac     REACTION: GI  . Metronidazole     REACTION: GI side eff  . Sulfamethoxazole W-Trimethoprim   . Tolterodine Tartrate     REACTION: None Effective  . Tramadol Other (See Comments)    Caused elevated BP   Current Outpatient Prescriptions on File Prior to Visit  Medication Sig Dispense Refill  . albuterol (VENTOLIN HFA) 108 (90 BASE) MCG/ACT inhaler As needed for asthma attack       . ALPRAZolam (XANAX) 1 MG tablet Take 1 tablet (1 mg total) by mouth 3 (three) times daily as needed. Warning may sedate  60 tablet  3  . APAP-Isometheptene-Dichloral (EPIDRIN) 325-65-100 MG CAPS Take 1 every 6 hours as needed       . Ascorbic Acid (VITAMIN C) 1000 MG tablet Take 1,000 mg by mouth daily.        . Calcium Carbonate (CALCIUM 600) 1500 MG TABS Take as directed.       Marland Kitchen esomeprazole (NEXIUM) 40 MG capsule Take 1 capsule (40 mg total) by mouth daily.  30 capsule  1  . fexofenadine (ALLEGRA) 180 MG tablet Take 180 mg by mouth daily.        . fluticasone (FLONASE) 50 MCG/ACT nasal spray Place 2 sprays into the nose daily. 2 puffs in each nostril daily      . HYDROcodone-acetaminophen (NORCO) 5-325 MG per tablet Take 1 tablet by mouth every 6 (six) hours as needed.        Marland Kitchen lisinopril (PRINIVIL,ZESTRIL) 5 MG tablet Take 1 tablet (5 mg total) by mouth every morning.  30 tablet  0  .  montelukast (SINGULAIR) 10 MG tablet Take 10 mg by mouth daily.        . Omega 3-6-9 Fatty Acids (OMEGA 3-6-9 COMPLEX PO) Take one tablet by mouth daily       .  sertraline (ZOLOFT) 50 MG tablet Take 1 tablet (50 mg total) by mouth daily.  30 tablet  1   No current facility-administered medications on file prior to visit.    Review of Systems Review of Systems  Constitutional: Negative for fever, appetite change, fatigue and unexpected weight change.  Eyes: Negative for pain and pos for blindness (baseline) Respiratory: Negative for cough and shortness of breath.   Cardiovascular: Negative for cp or palpitations    Gastrointestinal: Negative for nausea, diarrhea and constipation.  Genitourinary: Negative for urgency and frequency.  Skin: Negative for pallor or rash   Neurological: Negative for weakness, light-headedness, numbness and headaches.  Hematological: Negative for adenopathy. Does not bruise/bleed easily.  Psychiatric/Behavioral: Negative for dysphoric mood. The patient is not nervous/anxious.         Objective:   Physical Exam  Constitutional: She appears well-developed and well-nourished. No distress.  Morbidly obese and well appearing   HENT:  Head: Normocephalic and atraumatic.  Right Ear: External ear normal.  Left Ear: External ear normal.  Nose: Nose normal.  Mouth/Throat: Oropharynx is clear and moist.  Eyes: Pupils are equal, round, and reactive to light. No scleral icterus.  Neck: Normal range of motion. Neck supple. No JVD present. Carotid bruit is not present. No thyromegaly present.  Cardiovascular: Normal rate, regular rhythm, normal heart sounds and intact distal pulses.  Exam reveals no gallop.   Pulmonary/Chest: Effort normal and breath sounds normal. No respiratory distress. She has no wheezes.  Abdominal: Soft. Bowel sounds are normal. She exhibits no distension, no abdominal bruit and no mass. There is no tenderness.  Genitourinary: No breast swelling,  tenderness, discharge or bleeding.  Breast exam: No mass, nodules, thickening, tenderness, bulging, retraction, inflamation, nipple discharge or skin changes noted.  No axillary or clavicular LA.  Chaperoned exam.    Musculoskeletal: She exhibits no edema and no tenderness.  Lymphadenopathy:    She has no cervical adenopathy.  Neurological: She is alert. She has normal reflexes. No cranial nerve deficit. She exhibits normal muscle tone. Coordination normal.  Skin: Skin is warm and dry. No rash noted. No erythema. No pallor.  Psychiatric: She has a normal mood and affect.          Assessment & Plan:

## 2012-07-31 NOTE — Assessment & Plan Note (Signed)
Discussed how this problem influences overall health and the risks it imposes  Reviewed plan for weight loss with lower calorie diet (via better food choices and also portion control or program like weight watchers) and exercise building up to or more than 30 minutes 5 days per week including some aerobic activity   Commended work so far on eating issues and wt loss-enc her to keep it up

## 2012-07-31 NOTE — Assessment & Plan Note (Signed)
Reviewed health habits including diet and exercise and skin cancer prevention Also reviewed health mt list, fam hx and immunizations  Labs reviewed in detail  See HPI for health mt check off

## 2012-07-31 NOTE — Assessment & Plan Note (Signed)
bp in fair control at this time  No changes needed  Disc lifstyle change with low sodium diet and exercise  Labs reviewed  

## 2012-07-31 NOTE — Assessment & Plan Note (Signed)
Disc goals for lipids and reasons to control them Rev labs with pt Rev low sat fat diet in detail   

## 2012-08-09 ENCOUNTER — Encounter: Payer: Self-pay | Admitting: Family Medicine

## 2012-08-15 ENCOUNTER — Ambulatory Visit: Payer: Self-pay | Admitting: Family Medicine

## 2012-08-15 ENCOUNTER — Encounter: Payer: Self-pay | Admitting: Family Medicine

## 2012-08-25 ENCOUNTER — Other Ambulatory Visit: Payer: Self-pay | Admitting: *Deleted

## 2012-08-25 MED ORDER — ALPRAZOLAM 1 MG PO TABS: 1.0000 mg | ORAL_TABLET | Freq: Three times a day (TID) | ORAL | Status: DC | PRN

## 2012-08-25 MED ORDER — ESOMEPRAZOLE MAGNESIUM 40 MG PO CPDR: 40.0000 mg | DELAYED_RELEASE_CAPSULE | Freq: Every day | ORAL | Status: DC

## 2012-08-25 MED ORDER — SERTRALINE HCL 50 MG PO TABS: 25.0000 mg | ORAL_TABLET | Freq: Every day | ORAL | Status: DC

## 2012-08-25 MED ORDER — SOLIFENACIN SUCCINATE 5 MG PO TABS: 5.0000 mg | ORAL_TABLET | ORAL | Status: DC | PRN

## 2012-08-25 NOTE — Telephone Encounter (Signed)
Last filled 07/27/12

## 2012-08-25 NOTE — Telephone Encounter (Signed)
Rx called in as prescribed 

## 2012-08-25 NOTE — Telephone Encounter (Signed)
Px written for call in   

## 2012-09-26 ENCOUNTER — Other Ambulatory Visit: Payer: Self-pay | Admitting: Family Medicine

## 2012-09-26 MED ORDER — LISINOPRIL 5 MG PO TABS: 5.0000 mg | ORAL_TABLET | ORAL | Status: DC

## 2012-11-23 ENCOUNTER — Other Ambulatory Visit: Payer: Self-pay

## 2012-12-26 ENCOUNTER — Other Ambulatory Visit: Payer: Self-pay | Admitting: Family Medicine

## 2012-12-26 NOTE — Telephone Encounter (Signed)
Last filled on 11/27/12

## 2012-12-26 NOTE — Telephone Encounter (Signed)
Please refill for a year  

## 2012-12-27 MED ORDER — SERTRALINE HCL 50 MG PO TABS: 25.0000 mg | ORAL_TABLET | Freq: Every day | ORAL | Status: DC

## 2012-12-27 NOTE — Telephone Encounter (Signed)
done

## 2013-01-30 ENCOUNTER — Encounter: Payer: Self-pay | Admitting: Family Medicine

## 2013-02-28 ENCOUNTER — Other Ambulatory Visit: Payer: Self-pay | Admitting: Family Medicine

## 2013-02-28 MED ORDER — ALPRAZOLAM 1 MG PO TABS: 1.0000 mg | ORAL_TABLET | Freq: Three times a day (TID) | ORAL | Status: DC | PRN

## 2013-02-28 NOTE — Telephone Encounter (Signed)
Px written for call in   

## 2013-02-28 NOTE — Telephone Encounter (Signed)
Last filled 01/25/13 

## 2013-02-28 NOTE — Telephone Encounter (Signed)
Last office visit 07/31/2012.  Last filled 08/25/2012 for #60 with 5 refills.  Ok to refill?

## 2013-03-01 NOTE — Telephone Encounter (Signed)
Rx called in as prescribed 

## 2013-03-28 ENCOUNTER — Other Ambulatory Visit: Payer: Self-pay | Admitting: *Deleted

## 2013-03-28 MED ORDER — ESOMEPRAZOLE MAGNESIUM 40 MG PO CPDR: 40.0000 mg | DELAYED_RELEASE_CAPSULE | Freq: Every day | ORAL | Status: DC

## 2013-06-12 ENCOUNTER — Other Ambulatory Visit: Payer: BC Managed Care – PPO

## 2013-07-18 DIAGNOSIS — M179 Osteoarthritis of knee, unspecified: Secondary | ICD-10-CM | POA: Insufficient documentation

## 2013-07-26 ENCOUNTER — Other Ambulatory Visit: Payer: Self-pay | Admitting: Family Medicine

## 2013-07-26 MED ORDER — SOLIFENACIN SUCCINATE 5 MG PO TABS: 5.0000 mg | ORAL_TABLET | ORAL | Status: DC | PRN

## 2013-08-01 ENCOUNTER — Telehealth: Payer: Self-pay | Admitting: Family Medicine

## 2013-08-01 DIAGNOSIS — Z0000 Encounter for general adult medical examination without abnormal findings: Secondary | ICD-10-CM

## 2013-08-01 NOTE — Telephone Encounter (Signed)
Message copied by Judy PimpleWER, Maryon Kemnitz A on Wed Aug 01, 2013  9:58 PM ------      Message from: Alvina ChouWALSH, TERRI J      Created: Tue Jul 31, 2013  3:43 PM      Regarding: Lab orders for Thursday, 7.16.15       Patient is scheduled for CPX labs, please order future labs, Thanks , Terri       ------

## 2013-08-02 ENCOUNTER — Other Ambulatory Visit (INDEPENDENT_AMBULATORY_CARE_PROVIDER_SITE_OTHER): Payer: BC Managed Care – PPO

## 2013-08-02 DIAGNOSIS — I10 Essential (primary) hypertension: Secondary | ICD-10-CM

## 2013-08-02 DIAGNOSIS — E785 Hyperlipidemia, unspecified: Secondary | ICD-10-CM

## 2013-08-02 DIAGNOSIS — Z0000 Encounter for general adult medical examination without abnormal findings: Secondary | ICD-10-CM

## 2013-08-02 LAB — CBC WITH DIFFERENTIAL/PLATELET
BASOS ABS: 0.1 10*3/uL (ref 0.0–0.1)
Basophils Relative: 0.7 % (ref 0.0–3.0)
Eosinophils Absolute: 0.2 10*3/uL (ref 0.0–0.7)
Eosinophils Relative: 2 % (ref 0.0–5.0)
HCT: 39.8 % (ref 36.0–46.0)
HEMOGLOBIN: 13.2 g/dL (ref 12.0–15.0)
LYMPHS PCT: 26.1 % (ref 12.0–46.0)
Lymphs Abs: 2 10*3/uL (ref 0.7–4.0)
MCHC: 33.2 g/dL (ref 30.0–36.0)
MCV: 89.9 fl (ref 78.0–100.0)
MONOS PCT: 5.1 % (ref 3.0–12.0)
Monocytes Absolute: 0.4 10*3/uL (ref 0.1–1.0)
Neutro Abs: 5.1 10*3/uL (ref 1.4–7.7)
Neutrophils Relative %: 66.1 % (ref 43.0–77.0)
Platelets: 235 10*3/uL (ref 150.0–400.0)
RBC: 4.43 Mil/uL (ref 3.87–5.11)
RDW: 13.3 % (ref 11.5–15.5)
WBC: 7.7 10*3/uL (ref 4.0–10.5)

## 2013-08-02 LAB — LIPID PANEL
Cholesterol: 192 mg/dL (ref 0–200)
HDL: 39.4 mg/dL (ref >39.00)
LDL Cholesterol: 118 mg/dL — ABNORMAL HIGH (ref 0–99)
NONHDL: 152.6
Total CHOL/HDL Ratio: 5
Triglycerides: 172 mg/dL — ABNORMAL HIGH (ref 0.0–149.0)
VLDL: 34.4 mg/dL (ref 0.0–40.0)

## 2013-08-02 LAB — COMPREHENSIVE METABOLIC PANEL
ALBUMIN: 3.9 g/dL (ref 3.5–5.2)
ALT: 18 U/L (ref 0–35)
AST: 15 U/L (ref 0–37)
Alkaline Phosphatase: 66 U/L (ref 39–117)
BUN: 12 mg/dL (ref 6–23)
CALCIUM: 9.5 mg/dL (ref 8.4–10.5)
CHLORIDE: 99 meq/L (ref 96–112)
CO2: 33 meq/L — AB (ref 19–32)
Creatinine, Ser: 0.7 mg/dL (ref 0.4–1.2)
GFR: 98.6 mL/min (ref >60.00)
Glucose, Bld: 99 mg/dL (ref 70–99)
Potassium: 4.5 mEq/L (ref 3.5–5.1)
SODIUM: 137 meq/L (ref 135–145)
TOTAL PROTEIN: 7.2 g/dL (ref 6.0–8.3)
Total Bilirubin: 0.7 mg/dL (ref 0.2–1.2)

## 2013-08-02 LAB — TSH: TSH: 1.32 u[IU]/mL (ref 0.35–4.50)

## 2013-08-10 ENCOUNTER — Ambulatory Visit (INDEPENDENT_AMBULATORY_CARE_PROVIDER_SITE_OTHER): Payer: BC Managed Care – PPO | Admitting: Family Medicine

## 2013-08-10 ENCOUNTER — Encounter: Payer: Self-pay | Admitting: Family Medicine

## 2013-08-10 VITALS — BP 130/70 | HR 76 | Temp 98.8°F | Ht 61.0 in | Wt 290.8 lb

## 2013-08-10 DIAGNOSIS — E669 Obesity, unspecified: Secondary | ICD-10-CM

## 2013-08-10 DIAGNOSIS — I10 Essential (primary) hypertension: Secondary | ICD-10-CM

## 2013-08-10 DIAGNOSIS — E785 Hyperlipidemia, unspecified: Secondary | ICD-10-CM

## 2013-08-10 DIAGNOSIS — Z0000 Encounter for general adult medical examination without abnormal findings: Secondary | ICD-10-CM

## 2013-08-10 MED ORDER — ALPRAZOLAM 1 MG PO TABS: 1.0000 mg | ORAL_TABLET | Freq: Three times a day (TID) | ORAL | Status: DC | PRN

## 2013-08-10 MED ORDER — LISINOPRIL 5 MG PO TABS: 5.0000 mg | ORAL_TABLET | ORAL | Status: DC

## 2013-08-10 MED ORDER — ESOMEPRAZOLE MAGNESIUM 40 MG PO CPDR: 40.0000 mg | DELAYED_RELEASE_CAPSULE | Freq: Every day | ORAL | Status: DC

## 2013-08-10 MED ORDER — SERTRALINE HCL 50 MG PO TABS: 25.0000 mg | ORAL_TABLET | Freq: Every day | ORAL | Status: DC

## 2013-08-10 NOTE — Progress Notes (Signed)
Subjective:    Patient ID: Monica Madden, female    DOB: [birth year], 53 y.o.   MRN: [id number]  HPI Here for health maintenance exam and to review chronic medical problems    Doing ok  Husband had MI last week - had a stent and just came home (he needs to quit drinking) Daughter leaves in a month for college - $$ stress and also sad because she helps her a lot  She does have a great support network   She still has a lot of mobility issues -knees and back  Had inj in knee recently Meniscal problems   Wt is up 9 lb with bmi of 54 Morbid obesity  Mammogram 7/14 nl she is due - she did get a letter about it - will schedule her own at [facility] in Aug  Self exam- no lumps   Gyn Pap 7/13 Dr Shawnie Pons- still sees her -not every single year - will call to see when next exam is due No bleeding or problems Is menopausal  Has a bad uti 2 weeks ago - and they treated her    Td 7/06  colonosc 9/11 - 10 year recall    Hyperlipidemia Lab Results  Component Value Date   CHOL 192 08/02/2013   CHOL 197 07/24/2012   CHOL 199 06/30/2011   Lab Results  Component Value Date   HDL 39.40 08/02/2013   HDL 40.98 07/24/2012   HDL 49.80 06/30/2011   Lab Results  Component Value Date   LDLCALC 118* 08/02/2013   LDLCALC 122* 07/24/2012   LDLCALC 119* 06/30/2011   Lab Results  Component Value Date   TRIG 172.0* 08/02/2013   TRIG 155.0* 07/24/2012   TRIG 153.0* 06/30/2011   Lab Results  Component Value Date   CHOLHDL 5 08/02/2013   CHOLHDL 4 07/24/2012   CHOLHDL 4 06/30/2011   Lab Results  Component Value Date   LDLDIRECT 147.0 07/01/2010   has a hard time with stress eating - trying to control it with raw vegetables and better foods     Chemistry      Component Value Date/Time   NA 137 08/02/2013 1000   K 4.5 08/02/2013 1000   CL 99 08/02/2013 1000   CO2 33* 08/02/2013 1000   BUN 12 08/02/2013 1000   CREATININE 0.7 08/02/2013 1000      Component Value Date/Time   CALCIUM 9.5 08/02/2013 1000   ALKPHOS 66 08/02/2013 1000   AST 15 08/02/2013 1000   ALT 18 08/02/2013 1000   BILITOT 0.7 08/02/2013 1000     glucose 99    Due for tox screen  Xanax - last took it this am  vicodin last night   Patient Active Problem List   Diagnosis Date Noted  . Abnormal urine odor 06/30/2011  . Routine general medical examination at a health care facility 06/30/2011  . Amenorrhea 06/30/2011  . Adverse effects of medication 07/01/2010  . Right shoulder pain 07/01/2010  . DYSPEPSIA 09/26/2009  . IRRITABLE BOWEL SYNDROME 08/07/2009  . KNEE PAIN, BILATERAL 08/07/2009  . CONSTIPATION 01/15/2009  . LEG PAIN, LEFT 10/09/2008  . HYPERLIPIDEMIA 05/09/2008  . ESSENTIAL HYPERTENSION 03/29/2008  . 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 06/05/2007  . INCI HERNIA WITHOUT MENTION OBSTRUCTION/GANGRENE 05/12/2007  . FREQUENCY, URINARY 05/12/2007  . ARTHRALGIA 11/03/2006  . DEPENDENT EDEMA, LEGS 11/03/2006  . OBESITY 07/15/2006  . ANXIETY DEPRESSION 07/15/2006  . MIGRAINE HEADACHE 07/15/2006  . RETINITIS PIGMENTOSA 07/15/2006  .  VARICOSE VEINS, LOWER EXTREMITIES 07/15/2006  . ALLERGIC RHINITIS 07/15/2006  . ASTHMA 07/15/2006  . GERD 07/15/2006  . BACK PAIN, LUMBAR, CHRONIC 07/15/2006  . INCONTINENCE, URGE 07/15/2006   Past Medical History  Diagnosis Date  . Allergy     allergic rhinitis  . Asthma   . Obesity   . Chronic back pain     OA of spine  . Retinitis pigmentosa     blind  . MRSA infection 2008    Right leg  . Ovarian cyst   . Hypertension 03/2008  . IBS (irritable bowel syndrome)   . GERD (gastroesophageal reflux disease)     Endo negative 02/2000  . Cataract     left cataract removal 12/2008   Past Surgical History  Procedure Laterality Date  . Hernia repair  05/2006    ruptured hernia repair after fall and then two more surgeries within as many weeks for complications(05/2006) Umbilical hernia repair (06/1997)  . Appendectomy    . Cholecystectomy    . Cesarean  section    . Bladder surgery  1968    age 41 ? dilitation  . Endometrial biopsy  07/1998    negative  . Cystoscopy  03/2008    negative// Dr. Wanda Plump urologist  . Eye surgery  12/2008    cataract removal left  . Knee arthroscopy w/ meniscal repair      left knne   History  Substance Use Topics  . Smoking status: Never Smoker   . Smokeless tobacco: Never Used  . Alcohol Use: Yes     Comment: twice a year   Family History  Problem Relation Age of Onset  . Depression Mother   . Diabetes Maternal Uncle   . Hypertension Maternal Grandmother   . Depression Brother     Depression secondary to MVA injuries   Allergies  Allergen Reactions  . Diclofenac Sodium     REACTION: allergic/ made asthma bad  . Etodolac     REACTION: GI  . Metronidazole     REACTION: GI side eff  . Sulfamethoxazole-Trimethoprim   . Tolterodine Tartrate     REACTION: None Effective  . Tramadol Other (See Comments)    Caused elevated BP   Current Outpatient Prescriptions on File Prior to Visit  Medication Sig Dispense Refill  . albuterol (VENTOLIN HFA) 108 (90 BASE) MCG/ACT inhaler As needed for asthma attack       . Ascorbic Acid (VITAMIN C) 1000 MG tablet Take 1,000 mg by mouth daily.        . Calcium Carbonate (CALCIUM 600) 1500 MG TABS Take as directed.       . Coenzyme Q10 (CO Q 10 PO) Take 1 tablet by mouth daily.      . fexofenadine (ALLEGRA) 180 MG tablet Take 180 mg by mouth daily.        . fluticasone (FLONASE) 50 MCG/ACT nasal spray Place 2 sprays into the nose daily. 2 puffs in each nostril daily      . HYDROcodone-acetaminophen (NORCO) 5-325 MG per tablet Take 1 tablet by mouth every 6 (six) hours as needed.        . montelukast (SINGULAIR) 10 MG tablet Take 10 mg by mouth daily.        . Omega 3-6-9 Fatty Acids (OMEGA 3-6-9 COMPLEX PO) Take one tablet by mouth Twice a day      . solifenacin (VESICARE) 5 MG tablet Take 1 tablet (5 mg total) by mouth as needed.  30 tablet  0   No current  facility-administered medications on file prior to visit.    Review of Systems    Review of Systems  Constitutional: Negative for fever, appetite change, fatigue and unexpected weight change.  Eyes: Negative for pain and pos for baseline poor vision /blindness  Respiratory: Negative for cough and shortness of breath.   Cardiovascular: Negative for cp or palpitations    Gastrointestinal: Negative for nausea, diarrhea and constipation.  Genitourinary: Negative for urgency and frequency.  Skin: Negative for pallor or rash   MSK pos for joint pain from OA Neurological: Negative for weakness, light-headedness, numbness and headaches.  Hematological: Negative for adenopathy. Does not bruise/bleed easily.  Psychiatric/Behavioral: Negative for dysphoric mood. The patient is not nervous/anxious.      Objective:   Physical Exam  Constitutional: She appears well-developed and well-nourished. No distress.  Morbidly obese and well appearing   HENT:  Head: Normocephalic and atraumatic.  Right Ear: External ear normal.  Left Ear: External ear normal.  Nose: Nose normal.  Mouth/Throat: Oropharynx is clear and moist.  Eyes: Conjunctivae and EOM are normal. Pupils are equal, round, and reactive to light. Right eye exhibits no discharge. Left eye exhibits no discharge. No scleral icterus.  Neck: Normal range of motion. Neck supple. No JVD present. No thyromegaly present.  Cardiovascular: Normal rate, regular rhythm, normal heart sounds and intact distal pulses.  Exam reveals no gallop.   Pulmonary/Chest: Effort normal and breath sounds normal. No respiratory distress. She has no wheezes. She has no rales.  Abdominal: Soft. Bowel sounds are normal. She exhibits no distension and no mass. There is no tenderness.  Genitourinary: No breast swelling, tenderness, discharge or bleeding.  Breast exam: No mass, nodules, thickening, tenderness, bulging, retraction, inflamation, nipple discharge or skin changes  noted.  No axillary or clavicular LA.      Musculoskeletal: She exhibits no edema and no tenderness.  Poor rom knees Some problems with mobility  Lymphadenopathy:    She has no cervical adenopathy.  Neurological: She is alert. She has normal reflexes. No cranial nerve deficit. She exhibits normal muscle tone. Coordination normal.  Skin: Skin is warm and dry. No rash noted. No erythema. No pallor.  Psychiatric: She has a normal mood and affect.          Assessment & Plan:   Problem List Items Addressed This Visit     Cardiovascular and Mediastinum   ESSENTIAL HYPERTENSION - Primary      bp in fair control at this time  BP Readings from Last 1 Encounters:  08/10/13 130/70   No changes needed Disc lifstyle change with low sodium diet and exercise   Labs rev    Relevant Medications      lisinopril (PRINIVIL,ZESTRIL) tablet     Other   HYPERLIPIDEMIA     Disc goals for lipids and reasons to control them Rev labs with pt Rev low sat fat diet in detail Will work on better diet - handout given     Relevant Medications      lisinopril (PRINIVIL,ZESTRIL) tablet   OBESITY     Discussed how this problem influences overall health and the risks it imposes  Reviewed plan for weight loss with lower calorie diet (via better food choices and also portion control or program like weight watchers) and exercise building up to or more than 30 minutes 5 days per week including some aerobic activity   Pt has limited ability to exercise due  to her blindness and OA- would be a candidate for chair program  Also has to overcome emotional eating     Routine general medical examination at a health care facility     Reviewed health habits including diet and exercise and skin cancer prevention Reviewed appropriate screening tests for age  Also reviewed health mt list, fam hx and immunization status , as well as social and family history    See HPI Labs reviewed  Enc wt loss

## 2013-08-10 NOTE — Patient Instructions (Signed)
Toxicology screen today  Don't forget to schedule your annual mammogram  Work on cholesterol with better eating   (Avoid red meat/ fried foods/ egg yolks/ fatty breakfast meats/ butter, cheese and high fat dairy/ and shellfish ) Avoid stress eating as much as you can  Stay as active as you can   Fat and Cholesterol Control Diet Fat and cholesterol levels in your blood and organs are influenced by your diet. High levels of fat and cholesterol may lead to diseases of the heart, small and large blood vessels, gallbladder, liver, and pancreas. CONTROLLING FAT AND CHOLESTEROL WITH DIET Although exercise and lifestyle factors are important, your diet is key. That is because certain foods are known to raise cholesterol and others to lower it. The goal is to balance foods for their effect on cholesterol and more importantly, to replace saturated and trans fat with other types of fat, such as monounsaturated fat, polyunsaturated fat, and omega-3 fatty acids. On average, a person should consume no more than 15 to 17 g of saturated fat daily. Saturated and trans fats are considered "bad" fats, and they will raise LDL cholesterol. Saturated fats are primarily found in animal products such as meats, butter, and cream. However, that does not mean you need to give up all your favorite foods. Today, there are good tasting, low-fat, low-cholesterol substitutes for most of the things you like to eat. Choose low-fat or nonfat alternatives. Choose round or loin cuts of red meat. These types of cuts are lowest in fat and cholesterol. Chicken (without the skin), fish, veal, and ground [place] breast are great choices. Eliminate fatty meats, such as hot dogs and salami. Even shellfish have little or no saturated fat. Have a 3 oz (85 g) portion when you eat lean meat, poultry, or fish. Trans fats are also called "partially hydrogenated oils." They are oils that have been scientifically manipulated so that they are solid at room  temperature resulting in a longer shelf life and improved taste and texture of foods in which they are added. Trans fats are found in stick margarine, some tub margarines, cookies, crackers, and baked goods.  When baking and cooking, oils are a great substitute for butter. The monounsaturated oils are especially beneficial since it is believed they lower LDL and raise HDL. The oils you should avoid entirely are saturated tropical oils, such as coconut and palm.  Remember to eat a lot from food groups that are naturally free of saturated and trans fat, including fish, fruit, vegetables, beans, grains (barley, rice, couscous, bulgur wheat), and pasta (without cream sauces).  IDENTIFYING FOODS THAT LOWER FAT AND CHOLESTEROL  Soluble fiber may lower your cholesterol. This type of fiber is found in fruits such as apples, vegetables such as broccoli, potatoes, and carrots, legumes such as beans, peas, and lentils, and grains such as barley. Foods fortified with plant sterols (phytosterol) may also lower cholesterol. You should eat at least 2 g per day of these foods for a cholesterol lowering effect.  Read package labels to identify low-saturated fats, trans fat free, and low-fat foods at the supermarket. Select cheeses that have only 2 to 3 g saturated fat per ounce. Use a heart-healthy tub margarine that is free of trans fats or partially hydrogenated oil. When buying baked goods (cookies, crackers), avoid partially hydrogenated oils. Breads and muffins should be made from whole grains (whole-wheat or whole oat flour, instead of "flour" or "enriched flour"). Buy non-creamy canned soups with reduced salt and no added  fats.  FOOD PREPARATION TECHNIQUES  Never deep-fry. If you must fry, either stir-fry, which uses very little fat, or use non-stick cooking sprays. When possible, broil, bake, or roast meats, and steam vegetables. Instead of putting butter or margarine on vegetables, use lemon and herbs, applesauce,  and cinnamon (for squash and sweet potatoes). Use nonfat yogurt, salsa, and low-fat dressings for salads.  LOW-SATURATED FAT / LOW-FAT FOOD SUBSTITUTES Meats / Saturated Fat (g)  Avoid: Steak, marbled (3 oz/85 g) / 11 g  Choose: Steak, lean (3 oz/85 g) / 4 g  Avoid: Hamburger (3 oz/85 g) / 7 g  Choose: Hamburger, lean (3 oz/85 g) / 5 g  Avoid: Ham (3 oz/85 g) / 6 g  Choose: Ham, lean cut (3 oz/85 g) / 2.4 g  Avoid: Chicken, with skin, dark meat (3 oz/85 g) / 4 g  Choose: Chicken, skin removed, dark meat (3 oz/85 g) / 2 g  Avoid: Chicken, with skin, light meat (3 oz/85 g) / 2.5 g  Choose: Chicken, skin removed, light meat (3 oz/85 g) / 1 g Dairy / Saturated Fat (g)  Avoid: Whole milk (1 cup) / 5 g  Choose: Low-fat milk, 2% (1 cup) / 3 g  Choose: Low-fat milk, 1% (1 cup) / 1.5 g  Choose: Skim milk (1 cup) / 0.3 g  Avoid: Hard cheese (1 oz/28 g) / 6 g  Choose: Skim milk cheese (1 oz/28 g) / 2 to 3 g  Avoid: Cottage cheese, 4% fat (1 cup) / 6.5 g  Choose: Low-fat cottage cheese, 1% fat (1 cup) / 1.5 g  Avoid: Ice cream (1 cup) / 9 g  Choose: Sherbet (1 cup) / 2.5 g  Choose: Nonfat frozen yogurt (1 cup) / 0.3 g  Choose: Frozen fruit bar / trace  Avoid: Whipped cream (1 tbs) / 3.5 g  Choose: Nondairy whipped topping (1 tbs) / 1 g Condiments / Saturated Fat (g)  Avoid: Mayonnaise (1 tbs) / 2 g  Choose: Low-fat mayonnaise (1 tbs) / 1 g  Avoid: Butter (1 tbs) / 7 g  Choose: Extra light margarine (1 tbs) / 1 g  Avoid: Coconut oil (1 tbs) / 11.8 g  Choose: Olive oil (1 tbs) / 1.8 g  Choose: Corn oil (1 tbs) / 1.7 g  Choose: Safflower oil (1 tbs) / 1.2 g  Choose: Sunflower oil (1 tbs) / 1.4 g  Choose: Soybean oil (1 tbs) / 2.4 g  Choose: Canola oil (1 tbs) / 1 g Document Released: 01/04/2005 Document Revised: 05/01/2012 Document Reviewed: 04/04/2013 Exit[employer] Patient Information 2015 [employer]. This information is not intended to replace advice  given to you by your health care provider. Make sure you discuss any questions you have with your health care provider.

## 2013-08-10 NOTE — Progress Notes (Signed)
Pre visit review using our clinic review tool, if applicable. No additional management support is needed unless otherwise documented below in the visit note. 

## 2013-08-12 NOTE — Assessment & Plan Note (Signed)
Reviewed health habits including diet and exercise and skin cancer prevention Reviewed appropriate screening tests for age  Also reviewed health mt list, fam hx and immunization status , as well as social and family history    See HPI  Labs reviewed  Enc wt loss  

## 2013-08-12 NOTE — Assessment & Plan Note (Signed)
Discussed how this problem influences overall health and the risks it imposes  Reviewed plan for weight loss with lower calorie diet (via better food choices and also portion control or program like weight watchers) and exercise building up to or more than 30 minutes 5 days per week including some aerobic activity   Pt has limited ability to exercise due to her blindness and OA- would be a candidate for chair program  Also has to overcome emotional eating

## 2013-08-12 NOTE — Assessment & Plan Note (Signed)
bp in fair control at this time  BP Readings from Last 1 Encounters:  08/10/13 130/70   No changes needed Disc lifstyle change with low sodium diet and exercise   Labs rev

## 2013-08-12 NOTE — Assessment & Plan Note (Signed)
Disc goals for lipids and reasons to control them Rev labs with pt Rev low sat fat diet in detail Will work on better diet -handout given  

## 2013-08-27 ENCOUNTER — Other Ambulatory Visit: Payer: Self-pay | Admitting: *Deleted

## 2013-08-27 MED ORDER — SOLIFENACIN SUCCINATE 5 MG PO TABS: 5.0000 mg | ORAL_TABLET | ORAL | Status: DC | PRN

## 2013-08-29 ENCOUNTER — Encounter: Payer: Self-pay | Admitting: Family Medicine

## 2013-09-10 ENCOUNTER — Ambulatory Visit: Payer: Self-pay | Admitting: Family Medicine

## 2013-09-10 ENCOUNTER — Encounter: Payer: Self-pay | Admitting: Family Medicine

## 2013-10-31 LAB — PULMONARY FUNCTION TEST

## 2014-02-18 ENCOUNTER — Telehealth: Payer: Self-pay | Admitting: *Deleted

## 2014-02-18 MED ORDER — ALPRAZOLAM 1 MG PO TABS: 1.0000 mg | ORAL_TABLET | Freq: Three times a day (TID) | ORAL | Status: DC | PRN

## 2014-02-18 NOTE — Telephone Encounter (Signed)
Ok to refill? A faxed rx for Alprazolam (XANAX) 1 MG tablet. Last prescribed on 08/10/13.

## 2014-02-18 NOTE — Telephone Encounter (Signed)
Px written for call in   

## 2014-02-19 NOTE — Telephone Encounter (Signed)
Called in rx as directed.

## 2014-03-04 ENCOUNTER — Encounter: Payer: Self-pay | Admitting: Family Medicine

## 2014-03-06 ENCOUNTER — Telehealth: Payer: Self-pay | Admitting: Family Medicine

## 2014-03-06 NOTE — Telephone Encounter (Signed)
Called and spoken to patient. In[facility] her [facility] It was requested by faxed.

## 2014-03-06 NOTE — Telephone Encounter (Signed)
Pt called to see if rx was ready. Please call when ready at [phone]. Thanks.

## 2014-03-07 ENCOUNTER — Ambulatory Visit: Payer: Self-pay | Admitting: Internal Medicine

## 2014-03-07 NOTE — Telephone Encounter (Signed)
[facility] Client TELEPHONE ADVICE RECORD [facility] Patient Name: Monica Madden DOB: 11/14/1960 Initial Comment Caller states, orthopedic Dr wants to transfer pain Rx to Dr Milinda Antisower . She spoke with the office yesterday who said her pharm will get refills. She is having trouble filling her pain Rx. Nurse Assessment Nurse: Yetta BarreJones, RN, Miranda Date/Time (Eastern Time): 03/07/2014 2:34:12 PM Confirm and document reason for call. If symptomatic, describe symptoms. ---Caller states her and the doctor have been communicating through "Mychart". She was to have the orthopedic doctor fax a note in saying that the doctor did want her PCP to manage pain medication. She has had the fax sent to the office. She called yesterday and was told prescription would be sent. She has contacted the pharmacy and they do not have the prescription. She called back today and then received a call from her pharmacy who said she needs a handwritten prescription. Has the patient traveled out of the country within the last 30 days? ---Not Applicable Does the patient require triage? ---No Please document clinical information provided and list any resource used. ---Call[place] back[place] and spoke with [place]. She spoke with "Johny Drillinghan", Dr. Royden Purlower's MA, and she states there was a mix up. The office was thinking she needed the Alprazolam but she really needs a new script for Hydrocodone. She states that Dr. Milinda Antisower is out of the office today but they will address in the AM. I told the caller that if she did not hear back from the office by 1030 tomorrow to call back and ask to speak to "Johny Drillinghan". Guidelines Guideline Title Affirmed Question Affirmed Notes Final Disposition User Clinical Call Yetta BarreJones, RN, [employer]

## 2014-03-07 NOTE — Telephone Encounter (Signed)
I got the fax yesterday after hours - and if she is due for her pain medication I will get it printed first thing in the am for pick up  Let her know that this px has to be hand written by law so we cannot call it in   Please send this back to me to remind me to print it tomorrow

## 2014-03-07 NOTE — Telephone Encounter (Signed)
Pt left /vm; [facility] does not have refill discussed on 03/06/14 (pt did not leave name of med). Pt request cb.

## 2014-03-07 NOTE — Telephone Encounter (Signed)
Called [facility] and was told that the refill was too soon for patient to pick up. [facility] is going to call patient.

## 2014-03-08 ENCOUNTER — Encounter: Payer: Self-pay | Admitting: Family Medicine

## 2014-03-08 MED ORDER — HYDROCODONE-ACETAMINOPHEN 5-325 MG PO TABS: 1.0000 | ORAL_TABLET | Freq: Four times a day (QID) | ORAL | Status: DC | PRN

## 2014-03-08 NOTE — Telephone Encounter (Signed)
Px for Norco for pick up

## 2014-03-08 NOTE — Telephone Encounter (Signed)
Per Dr Milinda Antisower, pt was advised via mychart that Rx is available for pickup

## 2014-03-20 ENCOUNTER — Encounter: Payer: Self-pay | Admitting: Family Medicine

## 2014-04-11 ENCOUNTER — Telehealth: Payer: Self-pay | Admitting: *Deleted

## 2014-04-11 ENCOUNTER — Encounter: Payer: Self-pay | Admitting: Family Medicine

## 2014-04-11 MED ORDER — HYDROCODONE-ACETAMINOPHEN 5-325 MG PO TABS: 1.0000 | ORAL_TABLET | Freq: Four times a day (QID) | ORAL | Status: DC | PRN

## 2014-04-11 NOTE — Telephone Encounter (Signed)
Pt came by to pick up rx. Please call cell when ready cell is [phone]

## 2014-04-11 NOTE — Telephone Encounter (Signed)
Patient came back to office prior to being able to notify her. [facility]

## 2014-04-11 NOTE — Telephone Encounter (Signed)
printed and in Kim's box. 

## 2014-04-11 NOTE — Telephone Encounter (Signed)
Ok to refill in Dr. Royden Purlower's absence?

## 2014-05-20 ENCOUNTER — Encounter: Payer: Self-pay | Admitting: Family Medicine

## 2014-05-20 ENCOUNTER — Other Ambulatory Visit: Payer: Self-pay | Admitting: Family Medicine

## 2014-05-20 MED ORDER — HYDROCODONE-ACETAMINOPHEN 5-325 MG PO TABS: 1.0000 | ORAL_TABLET | Freq: Four times a day (QID) | ORAL | Status: DC | PRN

## 2014-05-20 NOTE — Telephone Encounter (Signed)
Pt notified Rx ready for pickup 

## 2014-05-20 NOTE — Telephone Encounter (Signed)
Px printed for pick up in IN box  

## 2014-07-03 ENCOUNTER — Other Ambulatory Visit: Payer: Self-pay | Admitting: Family Medicine

## 2014-07-03 MED ORDER — HYDROCODONE-ACETAMINOPHEN 5-325 MG PO TABS: 1.0000 | ORAL_TABLET | Freq: Four times a day (QID) | ORAL | Status: DC | PRN

## 2014-07-03 NOTE — Telephone Encounter (Signed)
Pt notified Rx ready for pickup 

## 2014-07-03 NOTE — Telephone Encounter (Signed)
Px printed for pick up in IN box  

## 2014-07-24 ENCOUNTER — Telehealth: Payer: Self-pay | Admitting: *Deleted

## 2014-07-24 NOTE — Telephone Encounter (Signed)
PA started for Vesicare  on [email or web address]

## 2014-07-26 NOTE — Telephone Encounter (Signed)
Left voicemail requesting pt to call office back 

## 2014-07-26 NOTE — Telephone Encounter (Signed)
PA for Vesicare was declined. Pt has to try one of the meds on their formulary list, I highlighted the alt meds that are covered on the 1st page of the denial letter, letter placed in your inbox

## 2014-07-26 NOTE — Telephone Encounter (Signed)
Please let her know- My first choice from the list would be the generic of Detrol LA - please ask if she has tried this before or if she is open to trying it  Thanks

## 2014-07-31 MED ORDER — TOLTERODINE TARTRATE ER 4 MG PO CP24: 4.0000 mg | ORAL_CAPSULE | Freq: Every day | ORAL | Status: DC

## 2014-07-31 NOTE — Telephone Encounter (Signed)
Left voicemail requesting pt to call office back 

## 2014-07-31 NOTE — Telephone Encounter (Signed)
Pt is okay with you changing her Rx to Detrol LA, pt uses [facility], please send in new Rx

## 2014-07-31 NOTE — Addendum Note (Signed)
Addended by: Roxy MannsWER, MARNE A on: 07/31/2014 01:31 PM   Modules accepted: Orders, Medications

## 2014-08-21 ENCOUNTER — Other Ambulatory Visit: Payer: Self-pay | Admitting: *Deleted

## 2014-08-21 ENCOUNTER — Encounter: Payer: Self-pay | Admitting: Family Medicine

## 2014-08-21 MED ORDER — ALPRAZOLAM 1 MG PO TABS: 1.0000 mg | ORAL_TABLET | Freq: Three times a day (TID) | ORAL | Status: DC | PRN

## 2014-08-21 NOTE — Telephone Encounter (Signed)
Px written for call in   She has a f/u planned in the fall

## 2014-08-21 NOTE — Telephone Encounter (Signed)
Rx called in as prescribed 

## 2014-08-21 NOTE — Telephone Encounter (Signed)
Fax refill request last refilled on 02/18/14 #60 with 5 additional refill, 02/18/14 #60 with 5 additional refills

## 2014-08-23 ENCOUNTER — Other Ambulatory Visit: Payer: Self-pay | Admitting: Family Medicine

## 2014-08-23 MED ORDER — SERTRALINE HCL 50 MG PO TABS: 25.0000 mg | ORAL_TABLET | Freq: Every day | ORAL | Status: DC

## 2014-08-23 MED ORDER — ESOMEPRAZOLE MAGNESIUM 40 MG PO CPDR: 40.0000 mg | DELAYED_RELEASE_CAPSULE | Freq: Every day | ORAL | Status: DC

## 2014-08-23 MED ORDER — HYDROCODONE-ACETAMINOPHEN 5-325 MG PO TABS: 1.0000 | ORAL_TABLET | Freq: Four times a day (QID) | ORAL | Status: DC | PRN

## 2014-08-23 MED ORDER — LISINOPRIL 5 MG PO TABS: 5.0000 mg | ORAL_TABLET | ORAL | Status: DC

## 2014-08-23 NOTE — Telephone Encounter (Signed)
I will refill zoloft electronically  Printed the pain med  Thanks

## 2014-08-23 NOTE — Telephone Encounter (Signed)
Received fax refill request for sertraline too, pt has CPE on 10/30/14, please advise

## 2014-08-26 NOTE — Telephone Encounter (Signed)
Left voicemail letting pt know Rx ready for pick up 

## 2014-08-27 ENCOUNTER — Ambulatory Visit (INDEPENDENT_AMBULATORY_CARE_PROVIDER_SITE_OTHER): Payer: BLUE CROSS/BLUE SHIELD | Admitting: Family Medicine

## 2014-08-27 ENCOUNTER — Encounter: Payer: Self-pay | Admitting: Family Medicine

## 2014-08-27 VITALS — BP 119/74 | HR 75 | Resp 20 | Ht 61.0 in | Wt 293.6 lb

## 2014-08-27 DIAGNOSIS — Z01419 Encounter for gynecological examination (general) (routine) without abnormal findings: Secondary | ICD-10-CM | POA: Diagnosis not present

## 2014-08-27 DIAGNOSIS — Z124 Encounter for screening for malignant neoplasm of cervix: Secondary | ICD-10-CM

## 2014-08-27 DIAGNOSIS — Z1151 Encounter for screening for human papillomavirus (HPV): Secondary | ICD-10-CM | POA: Diagnosis not present

## 2014-08-27 NOTE — Progress Notes (Signed)
  Subjective:     Monica Madden is a 54 y.o. female and is here for a comprehensive physical exam. The patient reports problems - some intertriginous yeast and hemorrhoids.  No cycles. Normal mammogram 8/15.Marland Kitchen  History   Social History  . Marital Status: Married    Spouse Name: N/A  . Number of Children: N/A  . Years of Education: N/A   Occupational History  . Not on file.   Social History Main Topics  . Smoking status: Never Smoker   . Smokeless tobacco: Never Used  . Alcohol Use: Yes     Comment: twice a year  . Drug Use: No  . Sexual Activity: Not on file   Other Topics Concern  . Not on file   Social History Narrative   Vision impaired    Health Maintenance  Topic Date Due  . Hepatitis C Screening  [birth year]04[birth year]1962  . HIV Screening  07/13/1977  . PAP SMEAR  07/20/2014  . TETANUS/TDAP  07/29/2014  . INFLUENZA VACCINE  08/19/2014  . MAMMOGRAM  09/11/2014  . COLONOSCOPY  10/03/2019    The following portions of the patient's history were reviewed and updated as appropriate: allergies, current medications, past family history, past medical history, past social history, past surgical history and problem list.  Review of Systems A comprehensive review of systems was negative.   Objective:    BP 119/74 mmHg  Pulse 75  Resp 20  Ht  (1.549 m)  Wt 293 lb 9.6 oz (133.176 kg)  BMI 55.50 kg/m2 General appearance: alert, cooperative, appears stated age and morbidly obese Head: Normocephalic, without obvious abnormality, atraumatic Neck: no adenopathy, supple, symmetrical, trachea midline and thyroid not enlarged, symmetric, no tenderness/mass/nodules Lungs: clear to auscultation bilaterally Breasts: normal appearance, no masses or tenderness Heart: regular rate and rhythm, S1, S2 normal, no murmur, click, rub or gallop Abdomen: soft, non-tender; bowel sounds normal; no masses,  no organomegaly Pelvic: cervix normal in appearance, external genitalia normal, no adnexal  masses or tenderness, no cervical motion tenderness, uterus normal size, shape, and consistency, vagina normal without discharge and exam is limited by body habitus Extremities: extremities normal, atraumatic, no cyanosis or edema Pulses: 2+ and symmetric Skin: Skin color, texture, turgor normal. No rashes or lesions Lymph nodes: Cervical, supraclavicular, and axillary nodes normal. Neurologic: Grossly normal    Assessment:    Healthy female exam. Morbid obesity      Plan:      Problem List Items Addressed This Visit    None    Visit Diagnoses    Screening for malignant neoplasm of cervix    -  Primary    Encounter for routine gynecological examination            See After Visit Summary for Counseling Recommendations

## 2014-08-27 NOTE — Patient Instructions (Signed)
Place annual gynecologic exam patient instructions here.

## 2014-08-30 LAB — CYTOLOGY - PAP

## 2014-09-30 LAB — PULMONARY FUNCTION TEST

## 2014-10-18 ENCOUNTER — Other Ambulatory Visit: Payer: Self-pay | Admitting: Family Medicine

## 2014-10-18 ENCOUNTER — Telehealth: Payer: Self-pay | Admitting: Family Medicine

## 2014-10-18 DIAGNOSIS — Z1159 Encounter for screening for other viral diseases: Secondary | ICD-10-CM

## 2014-10-18 DIAGNOSIS — Z0000 Encounter for general adult medical examination without abnormal findings: Secondary | ICD-10-CM

## 2014-10-18 MED ORDER — HYDROCODONE-ACETAMINOPHEN 5-325 MG PO TABS: 1.0000 | ORAL_TABLET | Freq: Four times a day (QID) | ORAL | Status: DC | PRN

## 2014-10-18 NOTE — Telephone Encounter (Signed)
See prev message, I know we can do her Tdap vaccine at her CPE appt but she is also wanting a Hepatits Screening, is that something you can add to her regular labs for her CPE or do you need to discuss this with her at her CPE appt?  Pt has CPE lab appt scheduled on 10/23/14, and the CPE scheduled on 10/30/14

## 2014-10-18 NOTE — Telephone Encounter (Signed)
Pt advise we can do hep C labs with CPE labs and the Tdap and flu at CPE appt  Message sent back to Dr. Milinda Antis so she can put orders in for her CPE labs

## 2014-10-18 NOTE — Telephone Encounter (Signed)
See below my chart message Pt has labs next week for cpx ointment Request From: [name]       With Provider: Roxy Manns, MD [facility]      Preferred Date Range: From 10/23/2014 To 10/23/2014      Reason: To address the following health maintenance concerns.   Tetanus/Tdap      Comments:   Attention: Dr. Milinda Antis or Dr. Royden Purl staff,   Please see the appt request that I just sent for the Hepatitis screening. I have 2 appts in the next two weeks at [facility]. I would like to request to get the Tetanus Vaccine one of the days I am already coming also.   thanks,   [employer]

## 2014-10-18 NOTE — Telephone Encounter (Signed)
We can do the hep C with her pre PE labs and then the Tdap when she is here for her physical  Send this back please to remind me to order her PE labs  Thanks

## 2014-10-18 NOTE — Telephone Encounter (Signed)
Px printed for pick up in IN box  

## 2014-10-20 DIAGNOSIS — Z1159 Encounter for screening for other viral diseases: Secondary | ICD-10-CM | POA: Insufficient documentation

## 2014-10-20 NOTE — Telephone Encounter (Signed)
Future labs 

## 2014-10-20 NOTE — Addendum Note (Signed)
Addended by: Roxy Manns A on: 10/20/2014 01:25 PM   Modules accepted: Orders

## 2014-10-21 NOTE — Telephone Encounter (Signed)
Pt notified Rx ready for pickup 

## 2014-10-22 ENCOUNTER — Telehealth: Payer: Self-pay | Admitting: Family Medicine

## 2014-10-22 DIAGNOSIS — Z1159 Encounter for screening for other viral diseases: Secondary | ICD-10-CM

## 2014-10-22 DIAGNOSIS — Z0000 Encounter for general adult medical examination without abnormal findings: Secondary | ICD-10-CM

## 2014-10-22 NOTE — Telephone Encounter (Signed)
-----   Message from Alvina Chou sent at 10/17/2014 10:00 AM EDT ----- Regarding: Lab orders for Wednesday, 10.5.16 Patient is scheduled for CPX labs, please order future labs, Thanks , Camelia Eng

## 2014-10-23 ENCOUNTER — Other Ambulatory Visit (INDEPENDENT_AMBULATORY_CARE_PROVIDER_SITE_OTHER): Payer: BLUE CROSS/BLUE SHIELD

## 2014-10-23 DIAGNOSIS — Z1159 Encounter for screening for other viral diseases: Secondary | ICD-10-CM

## 2014-10-23 DIAGNOSIS — Z0000 Encounter for general adult medical examination without abnormal findings: Secondary | ICD-10-CM

## 2014-10-23 LAB — CBC WITH DIFFERENTIAL/PLATELET
BASOS PCT: 0.5 % (ref 0.0–3.0)
Basophils Absolute: 0 10*3/uL (ref 0.0–0.1)
EOS ABS: 0.1 10*3/uL (ref 0.0–0.7)
EOS PCT: 2.2 % (ref 0.0–5.0)
HEMATOCRIT: 41.3 % (ref 36.0–46.0)
Hemoglobin: 13.6 g/dL (ref 12.0–15.0)
LYMPHS PCT: 29.4 % (ref 12.0–46.0)
Lymphs Abs: 1.6 10*3/uL (ref 0.7–4.0)
MCHC: 32.9 g/dL (ref 30.0–36.0)
MCV: 89.7 fl (ref 78.0–100.0)
Monocytes Absolute: 0.4 10*3/uL (ref 0.1–1.0)
Monocytes Relative: 6.9 % (ref 3.0–12.0)
NEUTROS ABS: 3.3 10*3/uL (ref 1.4–7.7)
Neutrophils Relative %: 61 % (ref 43.0–77.0)
PLATELETS: 264 10*3/uL (ref 150.0–400.0)
RBC: 4.6 Mil/uL (ref 3.87–5.11)
RDW: 13.5 % (ref 11.5–15.5)
WBC: 5.4 10*3/uL (ref 4.0–10.5)

## 2014-10-23 LAB — COMPREHENSIVE METABOLIC PANEL
ALBUMIN: 4.1 g/dL (ref 3.5–5.2)
ALT: 17 U/L (ref 0–35)
AST: 15 U/L (ref 0–37)
Alkaline Phosphatase: 72 U/L (ref 39–117)
BUN: 11 mg/dL (ref 6–23)
CALCIUM: 9.9 mg/dL (ref 8.4–10.5)
CHLORIDE: 100 meq/L (ref 96–112)
CO2: 31 meq/L (ref 19–32)
CREATININE: 0.63 mg/dL (ref 0.40–1.20)
GFR: 105.36 mL/min (ref >60.00)
Glucose, Bld: 100 mg/dL — ABNORMAL HIGH (ref 70–99)
POTASSIUM: 4.3 meq/L (ref 3.5–5.1)
Sodium: 139 mEq/L (ref 135–145)
Total Bilirubin: 0.7 mg/dL (ref 0.2–1.2)
Total Protein: 7.2 g/dL (ref 6.0–8.3)

## 2014-10-23 LAB — LIPID PANEL
CHOL/HDL RATIO: 5
CHOLESTEROL: 201 mg/dL — AB (ref 0–200)
HDL: 40.7 mg/dL (ref >39.00)
LDL CALC: 126 mg/dL — AB (ref 0–99)
NonHDL: 160.59
TRIGLYCERIDES: 174 mg/dL — AB (ref 0.0–149.0)
VLDL: 34.8 mg/dL (ref 0.0–40.0)

## 2014-10-23 LAB — TSH: TSH: 1.36 u[IU]/mL (ref 0.35–4.50)

## 2014-10-24 LAB — HEPATITIS C ANTIBODY: HCV AB: NEGATIVE

## 2014-10-29 ENCOUNTER — Encounter: Payer: Self-pay | Admitting: Family Medicine

## 2014-10-30 ENCOUNTER — Ambulatory Visit (INDEPENDENT_AMBULATORY_CARE_PROVIDER_SITE_OTHER): Payer: BLUE CROSS/BLUE SHIELD | Admitting: Family Medicine

## 2014-10-30 ENCOUNTER — Encounter: Payer: Self-pay | Admitting: Family Medicine

## 2014-10-30 VITALS — BP 120/74 | HR 77 | Temp 98.1°F | Ht 61.0 in | Wt 270.8 lb

## 2014-10-30 DIAGNOSIS — Z0000 Encounter for general adult medical examination without abnormal findings: Secondary | ICD-10-CM | POA: Diagnosis not present

## 2014-10-30 DIAGNOSIS — I10 Essential (primary) hypertension: Secondary | ICD-10-CM | POA: Diagnosis not present

## 2014-10-30 DIAGNOSIS — E785 Hyperlipidemia, unspecified: Secondary | ICD-10-CM

## 2014-10-30 DIAGNOSIS — Z23 Encounter for immunization: Secondary | ICD-10-CM | POA: Diagnosis not present

## 2014-10-30 DIAGNOSIS — F341 Dysthymic disorder: Secondary | ICD-10-CM | POA: Diagnosis not present

## 2014-10-30 MED ORDER — SERTRALINE HCL 50 MG PO TABS: 50.0000 mg | ORAL_TABLET | Freq: Every day | ORAL | Status: DC

## 2014-10-30 NOTE — Progress Notes (Signed)
Subjective:    Patient ID: Monica Madden, female    DOB: 1960-07-06, 54 y.o.   MRN: [id number]  HPI Here for health maintenance exam and to review chronic medical problems    Wt is down 23 lb  Really working on weight loss /diet/health habit changes  Follows a program where she restricts calories (moreso for 2 days of the week) Choosing better foods - has restricted sweets a lot -- working on stopping emotional eating (other coping strategies)  She cannot shop - her shopper stays on task with a good grocery list  bmi 51- morbidly obese   Feels better   Cannot get a whole lot of exercise -knee pain /meniscal tears and also she is blind  Limited but will be easier as she looses weight (so focusing on diet first)  HIV screen -not high risk/declines test  Hep C screen negative    Td 7/06 due for this /would like Tdap Would like a flu shot   Mammogram 8/15 nl -she will call [facility] to schedule that  Self exam -no lumps  Saw gyn for annual exam   Gyn visit and pap 8/16 nl   Colonoscopy 9/11 nl   bp is stable today -only 5 mg of lisinopril -would like to stop it some day if she could  No cp or palpitations or headaches or edema  No side effects to medicines  BP Readings from Last 3 Encounters:  10/30/14 120/74  08/27/14 119/74  08/10/13 130/70    Anxiety is getting worse  Worry about her daughter and their distance -she is in [place]  Vision is getting worse RP -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  Takes xanax 60 per month-is needing more and running out - has panic occ while reading  Cannot see a counselor right now      Cholesterol:  Lab Results  Component Value Date   CHOL 201* 10/23/2014   CHOL 192 08/02/2013   CHOL 197 07/24/2012   Lab Results  Component Value Date   HDL 40.70 10/23/2014   HDL 39.40 08/02/2013   HDL 43.90 07/24/2012   Lab Results  Component Value Date   LDLCALC 126* 10/23/2014   LDLCALC 118* 08/02/2013   LDLCALC 122* 07/24/2012   Lab  Results  Component Value Date   TRIG 174.0* 10/23/2014   TRIG 172.0* 08/02/2013   TRIG 155.0* 07/24/2012   Lab Results  Component Value Date   CHOLHDL 5 10/23/2014   CHOLHDL 5 08/02/2013   CHOLHDL 4 07/24/2012   Lab Results  Component Value Date   LDLDIRECT 147.0 07/01/2010    Not on any cholesterol medicine  Better diet Overall stable   Results for orders placed or performed in visit on 10/23/14  CBC with Differential/Platelet  Result Value Ref Range   WBC 5.4 4.0 - 10.5 K/uL   RBC 4.60 3.87 - 5.11 Mil/uL   Hemoglobin 13.6 12.0 - 15.0 g/dL   HCT 40.941.3 81.136.0 - 91.446.0 %   MCV 89.7 78.0 - 100.0 fl   MCHC 32.9 30.0 - 36.0 g/dL   RDW 78.213.5 95.611.5 - 21.315.5 %   Platelets 264.0 150.0 - 400.0 K/uL   Neutrophils Relative % 61.0 43.0 - 77.0 %   Lymphocytes Relative 29.4 12.0 - 46.0 %   Monocytes Relative 6.9 3.0 - 12.0 %   Eosinophils Relative 2.2 0.0 - 5.0 %   Basophils Relative 0.5 0.0 - 3.0 %   Neutro Abs 3.3 1.4 -  7.7 K/uL   Lymphs Abs 1.6 0.7 - 4.0 K/uL   Monocytes Absolute 0.4 0.1 - 1.0 K/uL   Eosinophils Absolute 0.1 0.0 - 0.7 K/uL   Basophils Absolute 0.0 0.0 - 0.1 K/uL  Comprehensive metabolic panel  Result Value Ref Range   Sodium 139 135 - 145 mEq/L   Potassium 4.3 3.5 - 5.1 mEq/L   Chloride 100 96 - 112 mEq/L   CO2 31 19 - 32 mEq/L   Glucose, Bld 100 (H) 70 - 99 mg/dL   BUN 11 6 - 23 mg/dL   Creatinine, Ser 4.03 0.40 - 1.20 mg/dL   Total Bilirubin 0.7 0.2 - 1.2 mg/dL   Alkaline Phosphatase 72 39 - 117 U/L   AST 15 0 - 37 U/L   ALT 17 0 - 35 U/L   Total Protein 7.2 6.0 - 8.3 g/dL   Albumin 4.1 3.5 - 5.2 g/dL   Calcium 9.9 8.4 - 47.4 mg/dL   GFR 259.56 >38.75 mL/min  Lipid panel  Result Value Ref Range   Cholesterol 201 (H) 0 - 200 mg/dL   Triglycerides 643.3 (H) 0.0 - 149.0 mg/dL   HDL 29.51 >88.41 mg/dL   VLDL 66.0 0.0 - 63.0 mg/dL   LDL Cholesterol 160 (H) 0 - 99 mg/dL   Total CHOL/HDL Ratio 5    NonHDL 160.59   TSH  Result Value Ref Range   TSH 1.36  0.35 - 4.50 uIU/mL  Hepatitis C antibody  Result Value Ref Range   HCV Ab NEGATIVE NEGATIVE    Patient Active Problem List   Diagnosis Date Noted  . Need for hepatitis C screening test 10/20/2014  . Abnormal urine odor 06/30/2011  . Routine general medical examination at a health care facility 06/30/2011  . Amenorrhea 06/30/2011  . Adverse effects of medication 07/01/2010  . Right shoulder pain 07/01/2010  . DYSPEPSIA 09/26/2009  . IRRITABLE BOWEL SYNDROME 08/07/2009  . KNEE PAIN, BILATERAL 08/07/2009  . CONSTIPATION 01/15/2009  . LEG PAIN, LEFT 10/09/2008  . Hyperlipidemia 05/09/2008  . Essential hypertension 03/29/2008  . 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 06/05/2007  . INCI HERNIA WITHOUT MENTION OBSTRUCTION/GANGRENE 05/12/2007  . FREQUENCY, URINARY 05/12/2007  . ARTHRALGIA 11/03/2006  . DEPENDENT EDEMA, LEGS 11/03/2006  . Morbid obesity (HCC) 07/15/2006  . ANXIETY DEPRESSION 07/15/2006  . MIGRAINE HEADACHE 07/15/2006  . RETINITIS PIGMENTOSA 07/15/2006  . VARICOSE VEINS, LOWER EXTREMITIES 07/15/2006  . ALLERGIC RHINITIS 07/15/2006  . ASTHMA 07/15/2006  . GERD 07/15/2006  . BACK PAIN, LUMBAR, CHRONIC 07/15/2006  . INCONTINENCE, URGE 07/15/2006   Past Medical History  Diagnosis Date  . Allergy     allergic rhinitis  . Asthma   . Obesity   . Chronic back pain     OA of spine  . Retinitis pigmentosa     blind  . MRSA infection 2008    Right leg  . Ovarian cyst   . Hypertension 03/2008  . IBS (irritable bowel syndrome)   . GERD (gastroesophageal reflux disease)     Endo negative 02/2000  . Cataract     left cataract removal 12/2008   Past Surgical History  Procedure Laterality Date  . Hernia repair  05/2006    ruptured hernia repair after fall and then two more surgeries within as many weeks for complications(05/2006) Umbilical hernia repair (06/1997)  . Appendectomy    . Cholecystectomy    . Cesarean section    . Bladder surgery  1968  age 42 ? dilitation  . Endometrial biopsy  07/1998    negative  . Cystoscopy  03/2008    negative// Dr. Wanda Plump urologist  . Eye surgery  12/2008    cataract removal left  . Knee arthroscopy w/ meniscal repair      left knne   Social History  Substance Use Topics  . Smoking status: Never Smoker   . Smokeless tobacco: Never Used  . Alcohol Use: Yes     Comment: twice a year   Family History  Problem Relation Age of Onset  . Depression Mother   . Diabetes Maternal Uncle   . Hypertension Maternal Grandmother   . Depression Brother     Depression secondary to MVA injuries  . Hypertension Brother   . Heart disease Father   . Heart disease Paternal Grandfather   . Heart disease Paternal Grandmother    Allergies  Allergen Reactions  . Diclofenac Sodium     REACTION: allergic/ made asthma bad  . Etodolac     REACTION: GI  . Metronidazole     REACTION: GI side eff  . Sulfamethoxazole-Trimethoprim   . Tolterodine Tartrate     REACTION: None Effective  . Tramadol Other (See Comments)    Caused elevated BP   Current Outpatient Prescriptions on File Prior to Visit  Medication Sig Dispense Refill  . albuterol (VENTOLIN HFA) 108 (90 BASE) MCG/ACT inhaler As needed for asthma attack     . ALPRAZolam (XANAX) 1 MG tablet Take 1 tablet (1 mg total) by mouth 3 (three) times daily as needed. Warning may sedate 60 tablet 5  . Ascorbic Acid (VITAMIN C) 1000 MG tablet Take 1,000 mg by mouth daily.      . Calcium Carbonate (CALCIUM 600) 1500 MG TABS Take as directed.     . Coenzyme Q10 (CO Q 10 PO) Take 1 tablet by mouth daily.    Marland Kitchen esomeprazole (NEXIUM) 40 MG capsule Take 1 capsule (40 mg total) by mouth daily. 30 capsule 2  . fexofenadine (ALLEGRA) 180 MG tablet Take 180 mg by mouth daily.      . fluticasone (FLONASE) 50 MCG/ACT nasal spray Place 2 sprays into the nose daily. 2 puffs in each nostril daily    . HYDROcodone-acetaminophen (NORCO/VICODIN) 5-325 MG tablet Take 1 tablet by  mouth every 6 (six) hours as needed. 90 tablet 0  . lisinopril (PRINIVIL,ZESTRIL) 5 MG tablet Take 1 tablet (5 mg total) by mouth every morning. 30 tablet 2  . montelukast (SINGULAIR) 10 MG tablet Take 10 mg by mouth daily.      . Omega 3-6-9 Fatty Acids (OMEGA 3-6-9 COMPLEX PO) Take one tablet by mouth Twice a day    . tolterodine (DETROL LA) 4 MG 24 hr capsule Take 1 capsule (4 mg total) by mouth daily. 30 capsule 11   No current facility-administered medications on file prior to visit.    Review of Systems    Review of Systems  Constitutional: Negative for fever, appetite change, fatigue and unexpected weight change.  Eyes: Negative for pain and pos for blindness from RP that is worsening with time  Respiratory: Negative for cough and shortness of breath.   Cardiovascular: Negative for cp or palpitations    Gastrointestinal: Negative for nausea, diarrhea and constipation.  Genitourinary: Negative for urgency and frequency.  Skin: Negative for pallor or rash   MSK pos for bilateral knee and hip pain  Neurological: Negative for weakness, light-headedness, numbness and headaches.  Hematological:  Negative for adenopathy. Does not bruise/bleed easily.  Psychiatric/Behavioral: Negative for dysphoric mood. The patient is  nervous/anxious.      Objective:   Physical Exam  Constitutional: She appears well-developed and well-nourished. No distress.  Morbidly obese female-examined in her chair   HENT:  Head: Normocephalic and atraumatic.  Right Ear: External ear normal.  Left Ear: External ear normal.  Mouth/Throat: Oropharynx is clear and moist.  Eyes: Conjunctivae and EOM are normal. Pupils are equal, round, and reactive to light. No scleral icterus.  Pt is legally blind   Neck: Normal range of motion. Neck supple. No JVD present. Carotid bruit is not present. No thyromegaly present.  Cardiovascular: Normal rate, regular rhythm, normal heart sounds and intact distal pulses.  Exam  reveals no gallop.   Pulmonary/Chest: Effort normal and breath sounds normal. No respiratory distress. She has no wheezes. She exhibits no tenderness.  Good air exch   Abdominal: Soft. Bowel sounds are normal. She exhibits no distension, no abdominal bruit and no mass. There is no tenderness.  Genitourinary:  Breast exam: No mass, nodules, thickening, tenderness, bulging, retraction, inflamation, nipple discharge or skin changes noted.  No axillary or clavicular LA.      Musculoskeletal: Normal range of motion. She exhibits no edema or tenderness.  Lymphadenopathy:    She has no cervical adenopathy.  Neurological: She is alert. She has normal reflexes. No cranial nerve deficit. She exhibits normal muscle tone. Coordination normal.  Skin: Skin is warm and dry. No rash noted. No erythema. No pallor.  Psychiatric: She has a normal mood and affect.  Cheerful and talkative           Assessment & Plan:   Problem List Items Addressed This Visit      Cardiovascular and Mediastinum   Essential hypertension - Primary    bp in fair control at this time  BP Readings from Last 1 Encounters:  10/30/14 120/74   No changes needed Disc lifstyle change with low sodium diet and exercise  Commended on wt loss so far         Other   Hyperlipidemia    Overall stable Disc goals for lipids and reasons to control them Rev labs with pt Rev low sat fat diet in detail       Morbid obesity (HCC)    Discussed how this problem influences overall health and the risks it imposes  Reviewed plan for weight loss with lower calorie diet (via better food choices and also portion control or program like weight watchers) and exercise building up to or more than 30 minutes 5 days per week including some aerobic activity   Commended on great wt loss so far (23 lb)- and pt is determined to continue  Hopes that with further wt loss she may become mobile enough for exercise - does enjoy water exercise        Routine general medical examination at a health care facility    Reviewed health habits including diet and exercise and skin cancer prevention Reviewed appropriate screening tests for age  Also reviewed health mt list, fam hx and immunization status , as well as social and family history   See HPI Go ahead and schedule your own mammogram at [facility]  Tdap vaccine today Flu vaccine today   Keep working on weight loss Labs are stable        Other Visit Diagnoses    Needs flu shot        Relevant  Orders    Flu Vaccine QUAD 36+ mos IM (Completed)    Need for Tdap vaccination        Relevant Orders    Tdap vaccine greater than or equal to 7yo IM (Completed)

## 2014-10-30 NOTE — Patient Instructions (Signed)
[facility]  Tdap vaccine today Flu vaccine today  Increase zoloft (sertranile) to 50 mg once daily to help with anxiety and panic attacks  If you still need more xanax that 60 per month in the coming months please let me know  Keep working on weight loss Labs are stable

## 2014-10-30 NOTE — Progress Notes (Signed)
Pre visit review using our clinic review tool, if applicable. No additional management support is needed unless otherwise documented below in the visit note. 

## 2014-10-31 ENCOUNTER — Other Ambulatory Visit: Payer: Self-pay | Admitting: Family Medicine

## 2014-10-31 DIAGNOSIS — Z1231 Encounter for screening mammogram for malignant neoplasm of breast: Secondary | ICD-10-CM

## 2014-10-31 NOTE — Assessment & Plan Note (Signed)
Discussed how this problem influences overall health and the risks it imposes  Reviewed plan for weight loss with lower calorie diet (via better food choices and also portion control or program like weight watchers) and exercise building up to or more than 30 minutes 5 days per week including some aerobic activity   Commended on great wt loss so far (23 lb)- and pt is determined to continue  Hopes that with further wt loss she may become mobile enough for exercise - does enjoy water exercise

## 2014-10-31 NOTE — Assessment & Plan Note (Signed)
bp in fair control at this time  BP Readings from Last 1 Encounters:  10/30/14 120/74   No changes needed Disc lifstyle change with low sodium diet and exercise  Commended on wt loss so far

## 2014-10-31 NOTE — Assessment & Plan Note (Signed)
Overall stable  Disc goals for lipids and reasons to control them Rev labs with pt Rev low sat fat diet in detail  

## 2014-10-31 NOTE — Assessment & Plan Note (Signed)
More anxious lately since daughter left for college Reviewed stressors/ coping techniques/symptoms/ support sources/ tx options and side effects in detail today Good coping skills Will inc zoloft to 50 mg daily  Discussed expectations of SSRI medication including time to effectiveness and mechanism of action, also poss of side effects (early and late)- including mental fuzziness, weight or appetite change, nausea and poss of worse dep or anxiety (even suicidal thoughts)  Pt voiced understanding and will stop med and update if this occurs   Continue xanax as needed  Update re: improvement in a month

## 2014-10-31 NOTE — Assessment & Plan Note (Signed)
Reviewed health habits including diet and exercise and skin cancer prevention Reviewed appropriate screening tests for age  Also reviewed health mt list, fam hx and immunization status , as well as social and family history   See HPI [facility]  Tdap vaccine today Flu vaccine today   Keep working on weight loss Labs are stable

## 2014-11-05 ENCOUNTER — Encounter: Payer: Self-pay | Admitting: *Deleted

## 2014-11-06 ENCOUNTER — Ambulatory Visit
Admission: RE | Admit: 2014-11-06 | Discharge: 2014-11-06 | Disposition: A | Discharge status: Home / Self Care | Payer: BLUE CROSS/BLUE SHIELD | Source: Ambulatory Visit | Attending: Family Medicine | Admitting: Family Medicine

## 2014-11-06 DIAGNOSIS — Z1231 Encounter for screening mammogram for malignant neoplasm of breast: Secondary | ICD-10-CM | POA: Diagnosis not present

## 2014-11-11 ENCOUNTER — Encounter
Admission: RE | Disposition: A | Discharge status: Home / Self Care | Payer: Self-pay | Source: Ambulatory Visit | Attending: Ophthalmology

## 2014-11-11 ENCOUNTER — Ambulatory Visit
Admission: RE | Admit: 2014-11-11 | Discharge: 2014-11-11 | Disposition: A | Discharge status: Home / Self Care | Payer: BLUE CROSS/BLUE SHIELD | Source: Ambulatory Visit | Attending: Ophthalmology | Admitting: Ophthalmology

## 2014-11-11 ENCOUNTER — Encounter: Payer: Self-pay | Admitting: *Deleted

## 2014-11-11 ENCOUNTER — Ambulatory Visit: Payer: BLUE CROSS/BLUE SHIELD | Admitting: Anesthesiology

## 2014-11-11 DIAGNOSIS — K219 Gastro-esophageal reflux disease without esophagitis: Secondary | ICD-10-CM | POA: Insufficient documentation

## 2014-11-11 DIAGNOSIS — M778 Other enthesopathies, not elsewhere classified: Secondary | ICD-10-CM | POA: Insufficient documentation

## 2014-11-11 DIAGNOSIS — H3552 Pigmentary retinal dystrophy: Secondary | ICD-10-CM | POA: Insufficient documentation

## 2014-11-11 DIAGNOSIS — H25041 Posterior subcapsular polar age-related cataract, right eye: Secondary | ICD-10-CM | POA: Insufficient documentation

## 2014-11-11 DIAGNOSIS — F329 Major depressive disorder, single episode, unspecified: Secondary | ICD-10-CM | POA: Insufficient documentation

## 2014-11-11 DIAGNOSIS — Z9842 Cataract extraction status, left eye: Secondary | ICD-10-CM | POA: Insufficient documentation

## 2014-11-11 DIAGNOSIS — Z888 Allergy status to other drugs, medicaments and biological substances status: Secondary | ICD-10-CM | POA: Diagnosis not present

## 2014-11-11 DIAGNOSIS — Z9049 Acquired absence of other specified parts of digestive tract: Secondary | ICD-10-CM | POA: Diagnosis not present

## 2014-11-11 DIAGNOSIS — D649 Anemia, unspecified: Secondary | ICD-10-CM | POA: Diagnosis not present

## 2014-11-11 DIAGNOSIS — Z6841 Body Mass Index (BMI) 40.0 and over, adult: Secondary | ICD-10-CM | POA: Insufficient documentation

## 2014-11-11 DIAGNOSIS — I10 Essential (primary) hypertension: Secondary | ICD-10-CM | POA: Diagnosis not present

## 2014-11-11 DIAGNOSIS — J45909 Unspecified asthma, uncomplicated: Secondary | ICD-10-CM | POA: Diagnosis not present

## 2014-11-11 DIAGNOSIS — M1990 Unspecified osteoarthritis, unspecified site: Secondary | ICD-10-CM | POA: Diagnosis not present

## 2014-11-11 DIAGNOSIS — H2511 Age-related nuclear cataract, right eye: Secondary | ICD-10-CM | POA: Diagnosis not present

## 2014-11-11 DIAGNOSIS — F419 Anxiety disorder, unspecified: Secondary | ICD-10-CM | POA: Diagnosis not present

## 2014-11-11 DIAGNOSIS — H269 Unspecified cataract: Secondary | ICD-10-CM | POA: Diagnosis present

## 2014-11-11 HISTORY — DX: Depression, unspecified: F32.A

## 2014-11-11 HISTORY — DX: Anxiety disorder, unspecified: F41.9

## 2014-11-11 HISTORY — DX: Anemia, unspecified: D64.9

## 2014-11-11 HISTORY — PX: CATARACT EXTRACTION W/PHACO: SHX586

## 2014-11-11 HISTORY — DX: Unspecified osteoarthritis, unspecified site: M19.90

## 2014-11-11 HISTORY — DX: Major depressive disorder, single episode, unspecified: F32.9

## 2014-11-11 SURGERY — PHACOEMULSIFICATION, CATARACT, WITH IOL INSERTION
Anesthesia: Monitor Anesthesia Care | Laterality: Right | Wound class: Clean

## 2014-11-11 MED ORDER — BUPIVACAINE HCL (PF) 0.75 % IJ SOLN
INTRAMUSCULAR | Status: AC
  Filled 2014-11-11: qty 10

## 2014-11-11 MED ORDER — MOXIFLOXACIN HCL 0.5 % OP SOLN
1.0000 [drp] | OPHTHALMIC | Status: AC | PRN
  Administered 2014-11-11 (×3): 1 [drp] via OPHTHALMIC

## 2014-11-11 MED ORDER — LIDOCAINE HCL (PF) 4 % IJ SOLN
INTRAMUSCULAR | Status: AC
  Filled 2014-11-11: qty 5

## 2014-11-11 MED ORDER — EPINEPHRINE HCL 1 MG/ML IJ SOLN
INTRAMUSCULAR | Status: AC
  Filled 2014-11-11: qty 1

## 2014-11-11 MED ORDER — LIDOCAINE HCL (PF) 4 % IJ SOLN
INTRAMUSCULAR | Status: DC | PRN
  Administered 2014-11-11: 4 mL via OPHTHALMIC

## 2014-11-11 MED ORDER — PHENYLEPHRINE HCL 10 % OP SOLN
1.0000 [drp] | OPHTHALMIC | Status: AC | PRN
  Administered 2014-11-11 (×4): 1 [drp] via OPHTHALMIC

## 2014-11-11 MED ORDER — HYALURONIDASE HUMAN 150 UNIT/ML IJ SOLN
INTRAMUSCULAR | Status: AC
  Filled 2014-11-11: qty 1

## 2014-11-11 MED ORDER — NA CHONDROIT SULF-NA HYALURON 40-17 MG/ML IO SOLN
INTRAOCULAR | Status: DC | PRN
  Administered 2014-11-11: 1 mL via INTRAOCULAR

## 2014-11-11 MED ORDER — TETRACAINE HCL 0.5 % OP SOLN
OPHTHALMIC | Status: AC
  Filled 2014-11-11: qty 2

## 2014-11-11 MED ORDER — CYCLOPENTOLATE HCL 2 % OP SOLN
OPHTHALMIC | Status: AC
  Filled 2014-11-11: qty 2

## 2014-11-11 MED ORDER — CYCLOPENTOLATE HCL 2 % OP SOLN
1.0000 [drp] | OPHTHALMIC | Status: AC | PRN
  Administered 2014-11-11 (×4): 1 [drp] via OPHTHALMIC

## 2014-11-11 MED ORDER — NA CHONDROIT SULF-NA HYALURON 40-17 MG/ML IO SOLN
INTRAOCULAR | Status: AC
  Filled 2014-11-11: qty 1

## 2014-11-11 MED ORDER — CARBACHOL 0.01 % IO SOLN
INTRAOCULAR | Status: DC | PRN
  Administered 2014-11-11: 0.5 mL via INTRAOCULAR

## 2014-11-11 MED ORDER — EPINEPHRINE HCL 1 MG/ML IJ SOLN
INTRAOCULAR | Status: DC | PRN
  Administered 2014-11-11: 200 mL via OPHTHALMIC

## 2014-11-11 MED ORDER — MIDAZOLAM HCL 2 MG/2ML IJ SOLN
INTRAMUSCULAR | Status: DC | PRN
  Administered 2014-11-11 (×3): 1 mg via INTRAVENOUS

## 2014-11-11 MED ORDER — TETRACAINE HCL 0.5 % OP SOLN
OPHTHALMIC | Status: DC | PRN
  Administered 2014-11-11: 2 [drp] via OPHTHALMIC

## 2014-11-11 MED ORDER — PHENYLEPHRINE HCL 10 % OP SOLN
OPHTHALMIC | Status: AC
  Filled 2014-11-11: qty 5

## 2014-11-11 MED ORDER — ALFENTANIL 500 MCG/ML IJ INJ
INJECTION | INTRAMUSCULAR | Status: DC | PRN
  Administered 2014-11-11 (×2): 500 ug via INTRAVENOUS

## 2014-11-11 MED ORDER — MOXIFLOXACIN HCL 0.5 % OP SOLN
OPHTHALMIC | Status: DC | PRN
  Administered 2014-11-11: 1 [drp] via OPHTHALMIC

## 2014-11-11 MED ORDER — SODIUM CHLORIDE 0.9 % IV SOLN
INTRAVENOUS | Status: DC
  Administered 2014-11-11: 08:00:00 via INTRAVENOUS

## 2014-11-11 MED ORDER — CEFUROXIME OPHTHALMIC INJECTION 1 MG/0.1 ML
INJECTION | OPHTHALMIC | Status: AC
  Filled 2014-11-11: qty 0.1

## 2014-11-11 MED ORDER — ONDANSETRON HCL 4 MG/2ML IJ SOLN
INTRAMUSCULAR | Status: DC | PRN
  Administered 2014-11-11: 4 mg via INTRAVENOUS

## 2014-11-11 MED ORDER — CEFUROXIME OPHTHALMIC INJECTION 1 MG/0.1 ML
INJECTION | OPHTHALMIC | Status: DC | PRN
  Administered 2014-11-11: 0.1 mL via INTRACAMERAL

## 2014-11-11 MED ORDER — MOXIFLOXACIN HCL 0.5 % OP SOLN
OPHTHALMIC | Status: AC
  Filled 2014-11-11: qty 3

## 2014-11-11 SURGICAL SUPPLY — 29 items

## 2014-11-11 NOTE — Anesthesia Postprocedure Evaluation (Signed)
  Anesthesia Post-op Note  Patient: Monica Madden  Procedure(s) Performed: Procedure(s) with comments: CATARACT EXTRACTION PHACO AND INTRAOCULAR LENS PLACEMENT (IOC) (Right) - LOT PACK: [id number]: [id number] H US: 00:29.7 AP: 11.2 CDE: 7.14  Anesthesia type:MAC  Patient location: Phase II  Post pain: Pain level controlled  Post assessment: Post-op Vital signs reviewed, Patient's Cardiovascular Status Stable, Respiratory Function Stable, Patent Airway and No signs of Nausea or vomiting  Post vital signs: Reviewed and stable  Last Vitals:  Filed Vitals:   11/11/14 0939  BP: 106/52  Pulse: 56  Temp: 36.2 C  Resp: 14    Level of consciousness: awake, alert  and patient cooperative  Complications: No apparent anesthesia complications

## 2014-11-11 NOTE — Op Note (Signed)
Date of Surgery: 11/11/2014 Date of Dictation: 11/11/2014 9:37 AM Pre-operative Diagnosis:  Nuclear Sclerotic Cataract and Posterior Subcapsular Cataract right Eye Post-operative Diagnosis: same Procedure performed: Extra-capsular Cataract Extraction (ECCE) with placement of a posterior chamber intraocular lens (IOL) right Eye IOL:  Implant Name Type Inv. Item Serial No. Manufacturer Lot No. LRB No. Used  LENS IOL ACRYSOF IQ 21.5 - [id number] Intraocular Lens LENS IOL ACRYSOF IQ 21.5 [id number] ALCON   Right 1   Anesthesia: 2% Lidocaine and 4% Marcaine in a 50/50 mixture with 10 unites/ml of Hylenex given as a peribulbar Anesthesiologist: Anesthesiologist: Gijsbertus Georgana CurioF Van Staveren, MD CRNA: Stormy FabianLinda Curtis, CRNA Complications: none Estimated Blood Loss: less than 1 ml  Description of procedure:  The patient was given anesthesia and sedation via intravenous access. The patient was then prepped and draped in the usual fashion. A 25-gauge needle was bent for initiating the capsulorhexis. A 5-0 silk suture was placed through the conjunctiva superior and inferiorly to serve as bridle sutures. Hemostasis was obtained at the superior limbus using an eraser cautery. A partial thickness groove was made at the anterior surgical limbus with a 64 Beaver blade and this was dissected anteriorly with an SYSCOlcon Crescent knife. The anterior chamber was entered at 10 o'clock with a 1.0 mm paracentesis knife and through the lamellar dissection with a 2.6 mm Alcon keratome. Epi-Shugarcaine 0.5 CC [9 cc BSS Plus (Alcon), 3 cc 4% preservative-free lidocaine (Hospira) and 4 cc 1:1000 preservative-free, bisulfite-free epinephrine] was injected into the anterior chamber via the paracentesis tract. Epi-Shugarcaine 0.5 CC [9 cc BSS Plus (Alcon), 3 cc 4% preservative-free lidocaine (Hospira) and 4 cc 1:1000 preservative-free, bisulfite-free epinephrine] was injected into the anterior chamber via the paracentesis tract.  DiscoVisc was injected to replace the aqueous and a continuous tear curvilinear capsulorhexis was performed using a bent 25-gauge needle.  Balance salt on a syringe was used to perform hydro-dissection and phacoemulsification was carried out using a divide and conquer technique. Procedure(s) with comments: CATARACT EXTRACTION PHACO AND INTRAOCULAR LENS PLACEMENT (IOC) (Right) - LOT PACK: [id number]: [id number] H US: 00:29.7 AP: 11.2 CDE: 7.14. Irrigation/aspiration was used to remove the residual cortex and the capsular bag was inflated with DiscoVisc. The intraocular lens was inserted into the capsular bag using a pre-loaded UltraSert Delivery System. Irrigation/aspiration was used to remove the residual DiscoVisc. The wound was inflated with balanced salt and checked for leaks. None were found. Miostat was injected via the paracentesis track and 0.1 ml of cefuroxime containing 1 mg of drug  was injected via the paracentesis track. The wound was checked for leaks again and none were found.   The bridal sutures were removed and two drops of Vigamox were placed on the eye. An eye shield was placed to protect the eye and the patient was discharged to the recovery area in good condition.   DINGELDEIN,STEVEN MD

## 2014-11-11 NOTE — Discharge Instructions (Addendum)
See handout.AMBULATORY SURGERY  DISCHARGE INSTRUCTIONS   1) The drugs that you were given will stay in your system until tomorrow so for the next 24 hours you should not:  A) Drive an automobile B) Make any legal decisions C) Drink any alcoholic beverage   2) You may resume regular meals tomorrow.  Today it is better to start with liquids and gradually work up to solid foods.  You may eat anything you prefer, but it is better to start with liquids, then soup and crackers, and gradually work up to solid foods.   3) Please notify your doctor immediately if you have any unusual bleeding, trouble breathing, redness and pain at the surgery site, drainage, fever, or pain not relieved by medication.    4) Additional Instructions:   Eye Surgery Discharge Instructions  Expect mild scratchy sensation or mild soreness. DO NOT RUB YOUR EYE!  The day of surgery:  Minimal physical activity, but bed rest is not required  No reading, computer work, or close hand work  No bending, lifting, or straining.  May watch TV  For 24 hours:  No driving, legal decisions, or alcoholic beverages  Safety precautions  Eat anything you prefer: It is better to start with liquids, then soup then solid foods.  _____ Eye patch should be worn until postoperative exam tomorrow.  ____ Solar shield eyeglasses should be worn for comfort in the sunlight/patch while sleeping  Resume all regular medications including aspirin or Coumadin if these were discontinued prior to surgery. You may shower, bathe, shave, or wash your hair. Tylenol may be taken for mild discomfort.  Call your doctor if you experience significant pain, nausea, or vomiting, fever > 101 or other signs of infection. [phone] or [phone] Specific instructions:  Follow-up Information    Follow up with Sallee LangeINGELDEIN,STEVEN, MD.   Specialty:  Ophthalmology   Why:  October 25 at 1010am   Contact information:   [address]   [place] [address] 279 [phone]          Please contact your physician with any problems or Same Day Surgery at [phone], Monday through Friday 6 am to 4 pm, or [place] at [facility] Main number at [phone].

## 2014-11-11 NOTE — Anesthesia Preprocedure Evaluation (Signed)
Anesthesia Evaluation  Patient identified by MRN, date of birth, ID band Patient awake    Reviewed: Allergy & Precautions, NPO status , Patient's Chart, lab work & pertinent test results  Airway Mallampati: III       Dental  (+) Upper Dentures, Lower Dentures   Pulmonary asthma ,     + decreased breath sounds      Cardiovascular hypertension, Pt. on medications Normal cardiovascular exam     Neuro/Psych    GI/Hepatic Neg liver ROS, GERD  ,  Endo/Other  Morbid obesity  Renal/GU negative Renal ROS     Musculoskeletal  (+) Arthritis ,   Abdominal (+) + obese,   Peds  Hematology  (+) anemia ,   Anesthesia Other Findings   Reproductive/Obstetrics                             Anesthesia Physical Anesthesia Plan  ASA: III  Anesthesia Plan: MAC   Post-op Pain Management:    Induction: Intravenous  Airway Management Planned: Nasal Cannula  Additional Equipment:   Intra-op Plan:   Post-operative Plan:   Informed Consent: I have reviewed the patients History and Physical, chart, labs and discussed the procedure including the risks, benefits and alternatives for the proposed anesthesia with the patient or authorized representative who has indicated his/her understanding and acceptance.     Plan Discussed with: CRNA  Anesthesia Plan Comments:         Anesthesia Quick Evaluation

## 2014-11-11 NOTE — Anesthesia Procedure Notes (Signed)
Procedure Name: MAC Date/Time: 11/11/2014 9:02 AM Performed by: Stormy FabianURTIS, Savvy Peeters Pre-anesthesia Checklist: Patient identified, Emergency Drugs available, Suction available and Patient being monitored Patient Re-evaluated:Patient Re-evaluated prior to inductionOxygen Delivery Method: Nasal cannula

## 2014-11-11 NOTE — Interval H&P Note (Signed)
History and Physical Interval Note:  11/11/2014 7:27 AM  Monica Madden  has presented today for surgery, with the diagnosis of CATARACT  The various methods of treatment have been discussed with the patient and family. After consideration of risks, benefits and other options for treatment, the patient has consented to  Procedure(s): CATARACT EXTRACTION PHACO AND INTRAOCULAR LENS PLACEMENT (IOC) (Right) as a surgical intervention .  The patient's history has been reviewed, patient examined, no change in status, stable for surgery.  I have reviewed the patient's chart and labs.  Questions were answered to the patient's satisfaction.     Shiah Berhow

## 2014-11-11 NOTE — H&P (Signed)
See scanned H&P

## 2014-11-11 NOTE — Transfer of Care (Signed)
Immediate Anesthesia Transfer of Care Note  Patient: Monica Madden  Procedure(s) Performed: Procedure(s) with comments: CATARACT EXTRACTION PHACO AND INTRAOCULAR LENS PLACEMENT (IOC) (Right) - LOT PACK: [id number]: [id number] H US: 00:29.7 AP: 11.2 CDE: 7.14  Patient Location: PHASE II  Anesthesia Type:MAC  Level of Consciousness: Awake, Alert, Oriented  Airway & Oxygen Therapy: Patient Spontanous Breathing and Patient on room air   Post-op Assessment: Report given to RN and Post -op Vital signs reviewed and stable  Post vital signs: Reviewed and stable  Last Vitals:  Filed Vitals:   11/11/14 0939  BP: 106/52  Pulse: 56  Temp: 36.2 C  Resp: 14    Complications: No apparent anesthesia complications

## 2014-11-11 NOTE — Progress Notes (Signed)
   11/11/14 0723  Clinical Encounter Type  Visited With Patient and family together  Visit Type Initial  Advance Directives (For Healthcare)  Does patient have an advance directive? Yes  Type of Advance Directive Living will  Does patient want to make changes to advanced directive? No - Patient declined  Copy of advanced directive(s) in chart? No - copy requested  Provided pastoral presence and support to patient and a family member in the same day surgery unit.  [employer]bury Automotive GroupChaplain Elbridge Magowan-pager [phone]

## 2014-11-25 ENCOUNTER — Encounter: Payer: Self-pay | Admitting: Family Medicine

## 2014-11-25 ENCOUNTER — Telehealth: Payer: Self-pay | Admitting: Family Medicine

## 2014-11-26 MED ORDER — LISINOPRIL 5 MG PO TABS
5.0000 mg | ORAL_TABLET | ORAL | Status: DC
Start: 2014-11-26 — End: 2015-02-20

## 2014-11-26 NOTE — Telephone Encounter (Signed)
Med refilled.

## 2014-12-09 ENCOUNTER — Other Ambulatory Visit: Payer: Self-pay | Admitting: Family Medicine

## 2014-12-09 MED ORDER — HYDROCODONE-ACETAMINOPHEN 5-325 MG PO TABS: 1.0000 | ORAL_TABLET | Freq: Four times a day (QID) | ORAL | Status: DC | PRN

## 2014-12-09 NOTE — Telephone Encounter (Signed)
Px printed for pick up in IN box  

## 2014-12-09 NOTE — Telephone Encounter (Signed)
See mychart message last refilled on 10/18/14 #90 with 0 refills, pt had CPE on 10/30/14

## 2014-12-09 NOTE — Telephone Encounter (Signed)
Pt notified Rx ready for pickup 

## 2014-12-25 ENCOUNTER — Other Ambulatory Visit: Payer: Self-pay | Admitting: *Deleted

## 2014-12-25 MED ORDER — FLUTICASONE PROPIONATE 50 MCG/ACT NA SUSP: 2.0000 | Freq: Two times a day (BID) | NASAL | Status: DC | PRN

## 2015-01-19 ENCOUNTER — Other Ambulatory Visit: Payer: Self-pay | Admitting: Family Medicine

## 2015-01-20 ENCOUNTER — Encounter: Payer: Self-pay | Admitting: Family Medicine

## 2015-01-20 ENCOUNTER — Other Ambulatory Visit: Payer: Self-pay | Admitting: Family Medicine

## 2015-01-21 MED ORDER — HYDROCODONE-ACETAMINOPHEN 5-325 MG PO TABS: 1.0000 | ORAL_TABLET | Freq: Four times a day (QID) | ORAL | Status: DC | PRN

## 2015-01-21 NOTE — Telephone Encounter (Signed)
Last filled 12/09/14--please advise

## 2015-01-21 NOTE — Telephone Encounter (Signed)
Given to daughter to take to her at visit

## 2015-01-21 NOTE — Telephone Encounter (Signed)
What medicine?

## 2015-01-21 NOTE — Telephone Encounter (Signed)
Pt is here with child today for a visit. She is wanting to know if she can get her Vicodin rx to take with her when they leave.

## 2015-02-19 ENCOUNTER — Encounter: Payer: Self-pay | Admitting: Family Medicine

## 2015-02-19 ENCOUNTER — Telehealth: Payer: Self-pay | Admitting: *Deleted

## 2015-02-19 MED ORDER — ALPRAZOLAM 1 MG PO TABS: 1.0000 mg | ORAL_TABLET | Freq: Three times a day (TID) | ORAL | Status: DC | PRN

## 2015-02-19 NOTE — Telephone Encounter (Signed)
Px written for call in   

## 2015-02-19 NOTE — Telephone Encounter (Signed)
Yes- it is tid prn -thanks

## 2015-02-19 NOTE — Telephone Encounter (Signed)
Jessica at [facility] wanted to verify alprazolam 1 mg instructions as tid prn; Shanda Bumps said previously pt had instructions as bid. Jessica request cb.

## 2015-02-19 NOTE — Telephone Encounter (Signed)
Rx called in as prescribed 

## 2015-02-19 NOTE — Telephone Encounter (Signed)
Fax refill request, last refilled on 08/21/14 #60 with 5 additional refills, last CPE 10/30/14, please advise

## 2015-02-20 ENCOUNTER — Telehealth: Payer: Self-pay | Admitting: Family Medicine

## 2015-02-20 MED ORDER — LISINOPRIL 5 MG PO TABS: 5.0000 mg | ORAL_TABLET | ORAL | Status: DC

## 2015-02-20 MED ORDER — SERTRALINE HCL 50 MG PO TABS: 50.0000 mg | ORAL_TABLET | Freq: Every day | ORAL | Status: DC

## 2015-02-20 MED ORDER — FLUTICASONE PROPIONATE 50 MCG/ACT NA SUSP: 2.0000 | Freq: Two times a day (BID) | NASAL | Status: DC | PRN

## 2015-02-20 MED ORDER — MONTELUKAST SODIUM 10 MG PO TABS: 10.0000 mg | ORAL_TABLET | Freq: Every day | ORAL | Status: DC

## 2015-02-20 MED ORDER — OMEPRAZOLE 40 MG PO CPDR: 40.0000 mg | DELAYED_RELEASE_CAPSULE | Freq: Every day | ORAL | Status: DC

## 2015-02-20 NOTE — Telephone Encounter (Signed)
Refills to [facility]

## 2015-02-20 NOTE — Telephone Encounter (Signed)
[facility] notified Rx correct with TID

## 2015-02-21 ENCOUNTER — Encounter: Payer: Self-pay | Admitting: Family Medicine

## 2015-02-24 ENCOUNTER — Telehealth: Payer: Self-pay | Admitting: Family Medicine

## 2015-02-24 MED ORDER — HYDROCODONE-ACETAMINOPHEN 5-325 MG PO TABS: 1.0000 | ORAL_TABLET | Freq: Four times a day (QID) | ORAL | Status: DC | PRN

## 2015-02-24 NOTE — Telephone Encounter (Signed)
Left voicemail letting pt know Rx ready for pick up 

## 2015-02-24 NOTE — Telephone Encounter (Signed)
Please call pt to pick up hydrocodone refill

## 2015-02-25 ENCOUNTER — Encounter: Payer: Self-pay | Admitting: Family Medicine

## 2015-02-25 DIAGNOSIS — Z79899 Other long term (current) drug therapy: Secondary | ICD-10-CM | POA: Diagnosis not present

## 2015-02-25 DIAGNOSIS — Z79891 Long term (current) use of opiate analgesic: Secondary | ICD-10-CM | POA: Diagnosis not present

## 2015-02-25 DIAGNOSIS — M170 Bilateral primary osteoarthritis of knee: Secondary | ICD-10-CM | POA: Diagnosis not present

## 2015-03-14 ENCOUNTER — Encounter: Payer: Self-pay | Admitting: Family Medicine

## 2015-04-02 ENCOUNTER — Other Ambulatory Visit: Payer: Self-pay | Admitting: Family Medicine

## 2015-04-02 MED ORDER — HYDROCODONE-ACETAMINOPHEN 5-325 MG PO TABS: 1.0000 | ORAL_TABLET | Freq: Four times a day (QID) | ORAL | Status: DC | PRN

## 2015-04-02 NOTE — Telephone Encounter (Signed)
Px printed for pick up in IN box  

## 2015-04-02 NOTE — Telephone Encounter (Signed)
Pt notified Rx ready for pickup 

## 2015-04-02 NOTE — Telephone Encounter (Signed)
See mychart message, last refilled on 02/24/15 #90 with 0 refills, pt had CPE on 10/30/14

## 2015-04-21 ENCOUNTER — Encounter: Payer: Self-pay | Admitting: Family Medicine

## 2015-04-21 ENCOUNTER — Telehealth: Payer: Self-pay | Admitting: Family Medicine

## 2015-04-21 MED ORDER — OXYBUTYNIN CHLORIDE 5 MG PO TABS: 5.0000 mg | ORAL_TABLET | Freq: Three times a day (TID) | ORAL | Status: DC

## 2015-04-21 NOTE — Telephone Encounter (Signed)
Trial of oxybutinin for overactive bladder

## 2015-05-06 ENCOUNTER — Encounter: Payer: Self-pay | Admitting: Family Medicine

## 2015-05-07 ENCOUNTER — Telehealth: Payer: Self-pay | Admitting: Family Medicine

## 2015-05-07 MED ORDER — HYDROCODONE-ACETAMINOPHEN 5-325 MG PO TABS: 1.0000 | ORAL_TABLET | Freq: Four times a day (QID) | ORAL | Status: DC | PRN

## 2015-05-07 NOTE — Telephone Encounter (Signed)
Pt notified Rx ready for pickup 

## 2015-05-07 NOTE — Telephone Encounter (Signed)
Needs refill of vicodin- printed and ready to pick up-please let her know

## 2015-06-17 ENCOUNTER — Other Ambulatory Visit: Payer: Self-pay | Admitting: Family Medicine

## 2015-06-17 MED ORDER — HYDROCODONE-ACETAMINOPHEN 5-325 MG PO TABS: 1.0000 | ORAL_TABLET | Freq: Four times a day (QID) | ORAL | Status: DC | PRN

## 2015-06-17 NOTE — Telephone Encounter (Signed)
Px printed for pick up in IN box  

## 2015-06-17 NOTE — Telephone Encounter (Signed)
See mychart message, last filled on 05/07/15 #90 with 0 refills, pt has CPE scheduled on 11/17/15

## 2015-06-17 NOTE — Addendum Note (Signed)
Addended by: Roxy MannsWER, MARNE A on: 06/17/2015 04:50 PM   Modules accepted: Orders

## 2015-06-18 NOTE — Telephone Encounter (Signed)
Left voicemail letting pt know Rx ready for pick up 

## 2015-06-23 DIAGNOSIS — H3552 Pigmentary retinal dystrophy: Secondary | ICD-10-CM | POA: Diagnosis not present

## 2015-06-26 DIAGNOSIS — M170 Bilateral primary osteoarthritis of knee: Secondary | ICD-10-CM | POA: Diagnosis not present

## 2015-08-05 ENCOUNTER — Other Ambulatory Visit: Payer: Self-pay | Admitting: Family Medicine

## 2015-08-05 ENCOUNTER — Encounter: Payer: Self-pay | Admitting: Family Medicine

## 2015-08-05 NOTE — Telephone Encounter (Signed)
Last seen 10/2014-CPE

## 2015-08-06 ENCOUNTER — Telehealth: Payer: Self-pay | Admitting: Family Medicine

## 2015-08-06 MED ORDER — HYDROCODONE-ACETAMINOPHEN 5-325 MG PO TABS: 1.0000 | ORAL_TABLET | Freq: Four times a day (QID) | ORAL | Status: DC | PRN

## 2015-08-06 MED ORDER — TOLTERODINE TARTRATE ER 4 MG PO CP24: 4.0000 mg | ORAL_CAPSULE | Freq: Every day | ORAL | Status: DC

## 2015-08-06 NOTE — Telephone Encounter (Signed)
Pt notified Rx ready for pickup 

## 2015-08-06 NOTE — Telephone Encounter (Signed)
Px printed for pick up in IN box  

## 2015-08-06 NOTE — Telephone Encounter (Signed)
done

## 2015-08-06 NOTE — Addendum Note (Signed)
Addended by: Roxy MannsWER, Jamyra Zweig A on: 08/06/2015 11:35 AM   Modules accepted: Orders

## 2015-08-06 NOTE — Telephone Encounter (Signed)
Spoke with pt about another med refill request, and she advise me that she is still using [facility] and would like the Rx (90 day supply to go there)  Med added to allergy list

## 2015-08-06 NOTE — Telephone Encounter (Signed)
Please let pt know I sent the detrol la generic

## 2015-08-21 ENCOUNTER — Other Ambulatory Visit: Payer: Self-pay

## 2015-08-21 ENCOUNTER — Encounter: Payer: Self-pay | Admitting: Family Medicine

## 2015-08-21 MED ORDER — ALPRAZOLAM 1 MG PO TABS: 1.0000 mg | ORAL_TABLET | Freq: Three times a day (TID) | ORAL | 0 refills | Status: DC | PRN

## 2015-08-21 NOTE — Telephone Encounter (Signed)
Last filled 07-21-15 #60 Last OV 10-30-14 Next OV 11-17-15

## 2015-08-21 NOTE — Telephone Encounter (Signed)
Px written for call in   

## 2015-08-22 NOTE — Telephone Encounter (Signed)
Rx called in as prescribed 

## 2015-09-04 DIAGNOSIS — H3552 Pigmentary retinal dystrophy: Secondary | ICD-10-CM | POA: Diagnosis not present

## 2015-09-10 ENCOUNTER — Other Ambulatory Visit: Payer: Self-pay | Admitting: Family Medicine

## 2015-09-10 MED ORDER — HYDROCODONE-ACETAMINOPHEN 5-325 MG PO TABS: 1.0000 | ORAL_TABLET | Freq: Four times a day (QID) | ORAL | 0 refills | Status: DC | PRN

## 2015-09-10 NOTE — Telephone Encounter (Signed)
Pt notified Rx ready for pickup 

## 2015-09-10 NOTE — Telephone Encounter (Signed)
Px printed for pick up in IN box  

## 2015-09-10 NOTE — Telephone Encounter (Signed)
See mychart message pt has CPE scheduled on 11/17/15, last filled on 08/06/15 #90 with 0 refill, please advise

## 2015-09-22 ENCOUNTER — Other Ambulatory Visit: Payer: Self-pay | Admitting: Family Medicine

## 2015-09-23 MED ORDER — ALPRAZOLAM 1 MG PO TABS: 1.0000 mg | ORAL_TABLET | Freq: Three times a day (TID) | ORAL | 0 refills | Status: DC | PRN

## 2015-09-23 NOTE — Telephone Encounter (Signed)
Pt has CPE scheduled on 11/17/15, last filled on 08/21/15 #60 tabs with 0 refills, please advise

## 2015-09-23 NOTE — Telephone Encounter (Signed)
Rx called in as prescribed 

## 2015-09-23 NOTE — Telephone Encounter (Signed)
Px written for call in   

## 2015-10-14 ENCOUNTER — Other Ambulatory Visit: Payer: Self-pay | Admitting: Family Medicine

## 2015-10-15 MED ORDER — HYDROCODONE-ACETAMINOPHEN 5-325 MG PO TABS: 1.0000 | ORAL_TABLET | Freq: Four times a day (QID) | ORAL | 0 refills | Status: DC | PRN

## 2015-10-15 NOTE — Telephone Encounter (Signed)
Px printed for pick up in IN box  

## 2015-10-15 NOTE — Telephone Encounter (Signed)
Patient notified and Rx placed up front for pick up. 

## 2015-10-15 NOTE — Telephone Encounter (Signed)
Pt has CPE on 11/17/15, last filled on 09/10/15 #90 with 0 refills, please advise

## 2015-10-16 DIAGNOSIS — H3552 Pigmentary retinal dystrophy: Secondary | ICD-10-CM | POA: Diagnosis not present

## 2015-10-17 ENCOUNTER — Other Ambulatory Visit: Payer: Self-pay | Admitting: Family Medicine

## 2015-10-17 DIAGNOSIS — Z1231 Encounter for screening mammogram for malignant neoplasm of breast: Secondary | ICD-10-CM

## 2015-10-21 ENCOUNTER — Encounter: Payer: Self-pay | Admitting: Family Medicine

## 2015-10-21 ENCOUNTER — Other Ambulatory Visit: Payer: Self-pay | Admitting: Family Medicine

## 2015-10-21 DIAGNOSIS — J4520 Mild intermittent asthma, uncomplicated: Secondary | ICD-10-CM | POA: Diagnosis not present

## 2015-10-21 DIAGNOSIS — Z01818 Encounter for other preprocedural examination: Secondary | ICD-10-CM | POA: Diagnosis not present

## 2015-10-21 DIAGNOSIS — Z23 Encounter for immunization: Secondary | ICD-10-CM | POA: Diagnosis not present

## 2015-10-22 MED ORDER — ALPRAZOLAM 1 MG PO TABS: 1.0000 mg | ORAL_TABLET | Freq: Three times a day (TID) | ORAL | 0 refills | Status: DC | PRN

## 2015-10-22 NOTE — Telephone Encounter (Signed)
See mychart message, last filled on 09/23/15 #60 with 0 refills, please advise

## 2015-10-22 NOTE — Telephone Encounter (Signed)
Rx called in as prescribed 

## 2015-10-22 NOTE — Telephone Encounter (Signed)
See mychart message. I updated pt's chart with flu shot

## 2015-10-22 NOTE — Telephone Encounter (Signed)
Px written for call in   

## 2015-10-23 MED ORDER — FEXOFENADINE HCL 180 MG PO TABS: 180.0000 mg | ORAL_TABLET | Freq: Every day | ORAL | 11 refills | Status: DC

## 2015-10-23 NOTE — Telephone Encounter (Signed)
Sent allegra to pharmacy

## 2015-10-29 ENCOUNTER — Other Ambulatory Visit (INDEPENDENT_AMBULATORY_CARE_PROVIDER_SITE_OTHER): Payer: PPO | Admitting: *Deleted

## 2015-10-29 DIAGNOSIS — H3552 Pigmentary retinal dystrophy: Secondary | ICD-10-CM | POA: Diagnosis not present

## 2015-10-29 DIAGNOSIS — R300 Dysuria: Secondary | ICD-10-CM

## 2015-10-29 NOTE — Progress Notes (Signed)
Pt here today c/o dysuria and urinary frequency for the past few days.  Pt had spoke to call-a-nurse after hours yesterday and they had sent her in Keflex 500mg  to the pharmacy to start.  Pt has taken a few doses and states that she is already feeling a little better.  Will send urine cx for verification and instructed pt that we would contact her is we needed to adjust her medication.  Instructed pt to continue medication and drink plenty of fluids as well as empty her bladder often. Pt acknowledged instructions.

## 2015-10-30 ENCOUNTER — Encounter: Payer: Self-pay | Admitting: Family Medicine

## 2015-10-30 LAB — URINE CULTURE: Organism ID, Bacteria: NO GROWTH

## 2015-11-09 ENCOUNTER — Telehealth: Payer: Self-pay | Admitting: Family Medicine

## 2015-11-09 DIAGNOSIS — R7309 Other abnormal glucose: Secondary | ICD-10-CM

## 2015-11-09 DIAGNOSIS — R7303 Prediabetes: Secondary | ICD-10-CM | POA: Insufficient documentation

## 2015-11-09 DIAGNOSIS — I10 Essential (primary) hypertension: Secondary | ICD-10-CM

## 2015-11-09 NOTE — Telephone Encounter (Signed)
-----   Message from Natasha C Chavers sent at 11/05/2015  1:16 PM EDT ----- Regarding: Cpx labs Tues 10/24, need orders. Thanks! :-) Please order  future cpx labs for pt's upcoming lab appt. Thanks Tasha  

## 2015-11-09 NOTE — Telephone Encounter (Signed)
-----   Message from Baldomero LamyNatasha C Chavers sent at 11/05/2015  1:16 PM EDT ----- Regarding: Cpx labs Tues 10/24, need orders. Thanks! :-) Please order  future cpx labs for pt's upcoming lab appt. Thanks Rodney Boozeasha

## 2015-11-11 ENCOUNTER — Other Ambulatory Visit (INDEPENDENT_AMBULATORY_CARE_PROVIDER_SITE_OTHER): Payer: PPO

## 2015-11-11 DIAGNOSIS — R7309 Other abnormal glucose: Secondary | ICD-10-CM | POA: Diagnosis not present

## 2015-11-11 DIAGNOSIS — R7989 Other specified abnormal findings of blood chemistry: Secondary | ICD-10-CM

## 2015-11-11 DIAGNOSIS — I10 Essential (primary) hypertension: Secondary | ICD-10-CM

## 2015-11-11 LAB — LIPID PANEL
Cholesterol: 202 mg/dL — ABNORMAL HIGH (ref 0–200)
HDL: 37.1 mg/dL — AB (ref >39.00)
NonHDL: 164.41
TRIGLYCERIDES: 232 mg/dL — AB (ref 0.0–149.0)
Total CHOL/HDL Ratio: 5
VLDL: 46.4 mg/dL — ABNORMAL HIGH (ref 0.0–40.0)

## 2015-11-11 LAB — COMPREHENSIVE METABOLIC PANEL
ALK PHOS: 63 U/L (ref 39–117)
ALT: 20 U/L (ref 0–35)
AST: 19 U/L (ref 0–37)
Albumin: 4.3 g/dL (ref 3.5–5.2)
BILIRUBIN TOTAL: 0.4 mg/dL (ref 0.2–1.2)
BUN: 14 mg/dL (ref 6–23)
CALCIUM: 10 mg/dL (ref 8.4–10.5)
CO2: 32 mEq/L (ref 19–32)
Chloride: 102 mEq/L (ref 96–112)
Creatinine, Ser: 0.69 mg/dL (ref 0.40–1.20)
GFR: 94.47 mL/min (ref >60.00)
GLUCOSE: 99 mg/dL (ref 70–99)
Potassium: 4.5 mEq/L (ref 3.5–5.1)
Sodium: 139 mEq/L (ref 135–145)
TOTAL PROTEIN: 7.4 g/dL (ref 6.0–8.3)

## 2015-11-11 LAB — LDL CHOLESTEROL, DIRECT: Direct LDL: 139 mg/dL

## 2015-11-11 LAB — CBC WITH DIFFERENTIAL/PLATELET
BASOS ABS: 0.1 10*3/uL (ref 0.0–0.1)
Basophils Relative: 0.8 % (ref 0.0–3.0)
Eosinophils Absolute: 0.2 10*3/uL (ref 0.0–0.7)
Eosinophils Relative: 3.1 % (ref 0.0–5.0)
HEMATOCRIT: 40.7 % (ref 36.0–46.0)
HEMOGLOBIN: 13.8 g/dL (ref 12.0–15.0)
LYMPHS PCT: 26 % (ref 12.0–46.0)
Lymphs Abs: 1.9 10*3/uL (ref 0.7–4.0)
MCHC: 33.8 g/dL (ref 30.0–36.0)
MCV: 88.1 fl (ref 78.0–100.0)
MONOS PCT: 7.3 % (ref 3.0–12.0)
Monocytes Absolute: 0.5 10*3/uL (ref 0.1–1.0)
Neutro Abs: 4.6 10*3/uL (ref 1.4–7.7)
Neutrophils Relative %: 62.8 % (ref 43.0–77.0)
Platelets: 260 10*3/uL (ref 150.0–400.0)
RBC: 4.62 Mil/uL (ref 3.87–5.11)
RDW: 13.5 % (ref 11.5–15.5)
WBC: 7.3 10*3/uL (ref 4.0–10.5)

## 2015-11-11 LAB — HEMOGLOBIN A1C: Hgb A1c MFr Bld: 5.6 % (ref 4.6–6.5)

## 2015-11-11 LAB — TSH: TSH: 1.05 u[IU]/mL (ref 0.35–4.50)

## 2015-11-17 ENCOUNTER — Encounter: Payer: Self-pay | Admitting: Family Medicine

## 2015-11-17 ENCOUNTER — Ambulatory Visit (INDEPENDENT_AMBULATORY_CARE_PROVIDER_SITE_OTHER): Payer: PPO | Admitting: Family Medicine

## 2015-11-17 VITALS — BP 122/72 | HR 67 | Temp 98.8°F | Ht 60.25 in | Wt 283.2 lb

## 2015-11-17 DIAGNOSIS — R7309 Other abnormal glucose: Secondary | ICD-10-CM

## 2015-11-17 DIAGNOSIS — E7800 Pure hypercholesterolemia, unspecified: Secondary | ICD-10-CM | POA: Diagnosis not present

## 2015-11-17 DIAGNOSIS — I10 Essential (primary) hypertension: Secondary | ICD-10-CM | POA: Diagnosis not present

## 2015-11-17 DIAGNOSIS — Z0000 Encounter for general adult medical examination without abnormal findings: Secondary | ICD-10-CM | POA: Diagnosis not present

## 2015-11-17 DIAGNOSIS — G8929 Other chronic pain: Secondary | ICD-10-CM

## 2015-11-17 DIAGNOSIS — H3552 Pigmentary retinal dystrophy: Secondary | ICD-10-CM

## 2015-11-17 MED ORDER — ALPRAZOLAM 1 MG PO TABS: 1.0000 mg | ORAL_TABLET | Freq: Three times a day (TID) | ORAL | 3 refills | Status: DC | PRN

## 2015-11-17 MED ORDER — MONTELUKAST SODIUM 10 MG PO TABS: 10.0000 mg | ORAL_TABLET | Freq: Every day | ORAL | 3 refills | Status: DC

## 2015-11-17 MED ORDER — OMEPRAZOLE 40 MG PO CPDR: 40.0000 mg | DELAYED_RELEASE_CAPSULE | Freq: Every day | ORAL | 3 refills | Status: DC

## 2015-11-17 MED ORDER — LISINOPRIL 5 MG PO TABS: 5.0000 mg | ORAL_TABLET | ORAL | 3 refills | Status: DC

## 2015-11-17 MED ORDER — HYDROCODONE-ACETAMINOPHEN 5-325 MG PO TABS: 1.0000 | ORAL_TABLET | Freq: Four times a day (QID) | ORAL | 0 refills | Status: DC | PRN

## 2015-11-17 MED ORDER — SERTRALINE HCL 50 MG PO TABS: 50.0000 mg | ORAL_TABLET | Freq: Every day | ORAL | 3 refills | Status: DC

## 2015-11-17 NOTE — Assessment & Plan Note (Signed)
Reviewed health habits including diet and exercise and skin cancer prevention Reviewed appropriate screening tests for age  Also reviewed health mt list, fam hx and immunization status , as well as social and family history   See HPI Labs reviewed  Enc her to call and schedule AMW (unsure she wants to do this) Get your mammogram as planned  For cholesterol    Avoid red meat/ fried foods/ egg yolks/ fatty breakfast meats/ butter, cheese and high fat dairy/ and shellfish   For blood sugar control- sweets and starchy foods

## 2015-11-17 NOTE — Assessment & Plan Note (Signed)
Disc goals for lipids and reasons to control them Rev labs with pt Rev low sat fat diet in detail LDL is up  Disc ways to dec it - laying off the trans and sat fats  Will continue to follow  Consider statin in the future if needed

## 2015-11-17 NOTE — Assessment & Plan Note (Addendum)
bp in fair control at this time  BP Readings from Last 1 Encounters:  11/17/15 122/72   No changes needed Disc lifstyle change with low sodium diet and exercise  Labs reviewed  Wt loss enc

## 2015-11-17 NOTE — Patient Instructions (Addendum)
Get your mammogram as planned  For cholesterol    Avoid red meat/ fried foods/ egg yolks/ fatty breakfast meats/ butter, cheese and high fat dairy/ and shellfish   For blood sugar control- sweets and starchy foods   Take care of yourself   When you want to get your AMW (medicare interview)- call us to schedule an appt with the nurse

## 2015-11-17 NOTE — Assessment & Plan Note (Signed)
Lab Results  Component Value Date   HGBA1C 5.6 11/11/2015   Enc low glycemic diet and wt control to prevent DM

## 2015-11-17 NOTE — Progress Notes (Signed)
Pre visit review using our clinic review tool, if applicable. No additional management support is needed unless otherwise documented below in the visit note. 

## 2015-11-17 NOTE — Progress Notes (Signed)
Subjective:    Patient ID: Monica Madden, female    DOB: 1960/08/13, 55 y.o.   MRN: [id number]  HPI Here for health maintenance exam and to review chronic medical problems   Thinks she is doing pretty good all things considered   Has not had AMW   Wt Readings from Last 3 Encounters:  11/17/15 283 lb 4 oz (128.5 kg)  11/11/14 278 lb (126.1 kg)  10/30/14 270 lb 12.8 oz (122.8 kg)  she is eating well/healthy diet -more and more salads/ low fat (husband has heart disease)  Does the best she can with chronic pain / back and knees - does the best she can  bmi is 54.8 Morbid obesity  Stressor- husband is a drinker in poor health  Daughter is doing well - but she is working very hard and she worries about her    HIV screening -declines/ not high risk   Mammogram 10/16-negative - has it set up Wednesday Self exam - no lumps or changes   Pap 8/16- negative with Dr Shawnie PonsPratt She will get a pap every 3 years now  Has had a neg endometrial bx and ablation in the past -did great  No bleeding- is menopausal   Colonoscopy 9/11-normal biopsies    Tetanus shot 10/16  Hep C screen negative  Flu shot 10/21/15  Hx of retinitis pigmentosa/ legally blind  Just got hooked back up for services for the blind  Her vision is getting gradually worse -she did have a 2nd op from [facility]  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  Waiting for new tx options   bp is stable today  No cp or palpitations or headaches or edema  No side effects to medicines  BP Readings from Last 3 Encounters:  11/17/15 122/72  11/11/14 (!) 120/58  10/30/14 120/74     Hx of hyperlipidemia Lab Results  Component Value Date   CHOL 202 (H) 11/11/2015   CHOL 201 (H) 10/23/2014   CHOL 192 08/02/2013   Lab Results  Component Value Date   HDL 37.10 (L) 11/11/2015   HDL 40.70 10/23/2014   HDL 39.40 08/02/2013   Lab Results  Component Value Date   LDLCALC 126 (H) 10/23/2014   LDLCALC 118 (H) 08/02/2013   LDLCALC 122 (H)  07/24/2012   Lab Results  Component Value Date   TRIG 232.0 (H) 11/11/2015   TRIG 174.0 (H) 10/23/2014   TRIG 172.0 (H) 08/02/2013   Lab Results  Component Value Date   CHOLHDL 5 11/11/2015   CHOLHDL 5 10/23/2014   CHOLHDL 5 08/02/2013   Lab Results  Component Value Date   LDLDIRECT 139.0 11/11/2015   LDLDIRECT 147.0 07/01/2010   working on low sat/trans fat diet-does well with that  carbs- she does stay mindful of  Pretzels are her go to snack however  She has to fight sweets craving - eating more fruit and oatmeal now however      Hx of chronic pain / back  Takes norco Last refill 9/27 90 with no refills   Takes zoloft for mood- this is stable   Hx of elevated glucose Lab Results  Component Value Date   HGBA1C 5.6 11/11/2015   Will continue to watch her sweets   Results for orders placed or performed in visit on 11/11/15  CBC with Differential/Platelet  Result Value Ref Range   WBC 7.3 4.0 - 10.5 K/uL   RBC 4.62 3.87 - 5.11 Mil/uL   Hemoglobin 13.8  12.0 - 15.0 g/dL   HCT 95.2 84.1 - 32.4 %   MCV 88.1 78.0 - 100.0 fl   MCHC 33.8 30.0 - 36.0 g/dL   RDW 40.1 02.7 - 25.3 %   Platelets 260.0 150.0 - 400.0 K/uL   Neutrophils Relative % 62.8 43.0 - 77.0 %   Lymphocytes Relative 26.0 12.0 - 46.0 %   Monocytes Relative 7.3 3.0 - 12.0 %   Eosinophils Relative 3.1 0.0 - 5.0 %   Basophils Relative 0.8 0.0 - 3.0 %   Neutro Abs 4.6 1.4 - 7.7 K/uL   Lymphs Abs 1.9 0.7 - 4.0 K/uL   Monocytes Absolute 0.5 0.1 - 1.0 K/uL   Eosinophils Absolute 0.2 0.0 - 0.7 K/uL   Basophils Absolute 0.1 0.0 - 0.1 K/uL  Comprehensive metabolic panel  Result Value Ref Range   Sodium 139 135 - 145 mEq/L   Potassium 4.5 3.5 - 5.1 mEq/L   Chloride 102 96 - 112 mEq/L   CO2 32 19 - 32 mEq/L   Glucose, Bld 99 70 - 99 mg/dL   BUN 14 6 - 23 mg/dL   Creatinine, Ser 6.64 0.40 - 1.20 mg/dL   Total Bilirubin 0.4 0.2 - 1.2 mg/dL   Alkaline Phosphatase 63 39 - 117 U/L   AST 19 0 - 37 U/L   ALT  20 0 - 35 U/L   Total Protein 7.4 6.0 - 8.3 g/dL   Albumin 4.3 3.5 - 5.2 g/dL   Calcium 40.3 8.4 - 47.4 mg/dL   GFR 25.95 >63.87 mL/min  Lipid panel  Result Value Ref Range   Cholesterol 202 (H) 0 - 200 mg/dL   Triglycerides 564.3 (H) 0.0 - 149.0 mg/dL   HDL 32.95 (L) >18.84 mg/dL   VLDL 16.6 (H) 0.0 - 06.3 mg/dL   Total CHOL/HDL Ratio 5    NonHDL 164.41   TSH  Result Value Ref Range   TSH 1.05 0.35 - 4.50 uIU/mL  Hemoglobin A1c  Result Value Ref Range   Hgb A1c MFr Bld 5.6 4.6 - 6.5 %  LDL cholesterol, direct  Result Value Ref Range   Direct LDL 139.0 mg/dL    Patient Active Problem List   Diagnosis Date Noted  . Chronic pain 11/17/2015  . Elevated glucose level 11/09/2015  . Need for hepatitis C screening test 10/20/2014  . Routine general medical examination at a health care facility 06/30/2011  . Amenorrhea 06/30/2011  . Adverse effects of medication 07/01/2010  . Right shoulder pain 07/01/2010  . DYSPEPSIA 09/26/2009  . IRRITABLE BOWEL SYNDROME 08/07/2009  . KNEE PAIN, BILATERAL 08/07/2009  . CONSTIPATION 01/15/2009  . LEG PAIN, LEFT 10/09/2008  . Hyperlipidemia 05/09/2008  . Essential hypertension 03/29/2008  . 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 06/05/2007  . INCI HERNIA WITHOUT MENTION OBSTRUCTION/GANGRENE 05/12/2007  . FREQUENCY, URINARY 05/12/2007  . ARTHRALGIA 11/03/2006  . DEPENDENT EDEMA, LEGS 11/03/2006  . Morbid obesity (HCC) 07/15/2006  . ANXIETY DEPRESSION 07/15/2006  . MIGRAINE HEADACHE 07/15/2006  . RETINITIS PIGMENTOSA 07/15/2006  . VARICOSE VEINS, LOWER EXTREMITIES 07/15/2006  . ALLERGIC RHINITIS 07/15/2006  . ASTHMA 07/15/2006  . GERD 07/15/2006  . BACK PAIN, LUMBAR, CHRONIC 07/15/2006  . INCONTINENCE, URGE 07/15/2006   Past Medical History:  Diagnosis Date  . Allergy    allergic rhinitis  . Anemia   . Anxiety    ATTACKS  . Arthritis   . Asthma   . Cataract    left cataract removal 12/2008  .  Chronic back pain    OA  of spine  . Depression   . GERD (gastroesophageal reflux disease)    Endo negative 02/2000  . Hypertension 03/2008  . IBS (irritable bowel syndrome)   . MRSA infection 2008   Right leg  . Obesity   . Ovarian cyst   . Retinitis pigmentosa    blind   Past Surgical History:  Procedure Laterality Date  . APPENDECTOMY    . BLADDER SURGERY  1968   age 55 ? dilitation  . CATARACT EXTRACTION W/PHACO Right 11/11/2014   Procedure: CATARACT EXTRACTION PHACO AND INTRAOCULAR LENS PLACEMENT (IOC);  Surgeon: Sallee LangeSteven Dingeldein, MD;  Location: [facility] ORS;  Service: Ophthalmology;  Laterality: Right;  LOT PACK: [id number] H US: 00:29.7 AP: 11.2 CDE: 7.14  . CESAREAN SECTION    . CHOLECYSTECTOMY    . CYSTOSCOPY  03/2008   negative// Dr. Wanda PlumpHumphries urologist  . ENDOMETRIAL BIOPSY  07/1998   negative  . EYE SURGERY  12/2008   cataract removal left  . HERNIA REPAIR  05/2006   ruptured hernia repair after fall and then two more surgeries within as many weeks for complications(05/2006) Umbilical hernia repair (06/1997)  . KNEE ARTHROSCOPY W/ MENISCAL REPAIR     left knne   Social History  Substance Use Topics  . Smoking status: Never Smoker  . Smokeless tobacco: Never Used  . Alcohol use Yes     Comment: twice a year   Family History  Problem Relation Age of Onset  . Depression Mother   . Diabetes Maternal Uncle   . Hypertension Maternal Grandmother   . Depression Brother     Depression secondary to MVA injuries  . Hypertension Brother   . Heart disease Father   . Heart disease Paternal Grandfather   . Heart disease Paternal Grandmother    Allergies  Allergen Reactions  . Diclofenac Sodium     REACTION: allergic/ made asthma bad  . Etodolac     REACTION: GI  . Metronidazole     REACTION: GI side eff  . Oxybutynin Itching  . Sulfamethoxazole-Trimethoprim   . Tolterodine Tartrate     REACTION: None Effective  . Tramadol Other (See Comments)    Caused elevated BP   Current  Outpatient Prescriptions on File Prior to Visit  Medication Sig Dispense Refill  . albuterol (VENTOLIN HFA) 108 (90 BASE) MCG/ACT inhaler As needed for asthma attack     . Ascorbic Acid (VITAMIN C) 1000 MG tablet Take 1,000 mg by mouth daily.      . Calcium Carbonate (CALCIUM 600) 1500 MG TABS Take as directed.     . Coenzyme Q10 (CO Q 10 PO) Take 1 tablet by mouth daily.    . fexofenadine (ALLEGRA) 180 MG tablet Take 1 tablet (180 mg total) by mouth daily. 30 tablet 11  . fluticasone (FLONASE) 50 MCG/ACT nasal spray Place 2 sprays into both nostrils 2 (two) times daily as needed for allergies or rhinitis. 2 puffs in each nostril daily 48 g 3  . Omega 3-6-9 Fatty Acids (OMEGA 3-6-9 COMPLEX PO) Take one tablet by mouth Twice a day    . tolterodine (DETROL LA) 4 MG 24 hr capsule Take 1 capsule (4 mg total) by mouth daily. 90 capsule 3   No current facility-administered medications on file prior to visit.      Review of Systems    Review of Systems  Constitutional: Negative for fever, appetite change, fatigue and unexpected weight change.  Eyes:  Negative for pain and pos for blindness (worsening) Respiratory: Negative for cough and shortness of breath.   Cardiovascular: Negative for cp or palpitations    Gastrointestinal: Negative for nausea, diarrhea and constipation.  Genitourinary: Negative for urgency and frequency.  Skin: Negative for pallor or rash   MSK pos for chronic lumbar and knee pain  Neurological: Negative for weakness, light-headedness, numbness and headaches.  Hematological: Negative for adenopathy. Does not bruise/bleed easily.  Psychiatric/Behavioral: Negative for dysphoric mood. The patient is not nervous/anxious.      Objective:   Physical Exam  Constitutional: She appears well-developed and well-nourished. No distress.  Morbidly obese and well appearing  HENT:  Head: Normocephalic and atraumatic.  Right Ear: External ear normal.  Left Ear: External ear normal.    Mouth/Throat: Oropharynx is clear and moist.  Eyes: Conjunctivae and EOM are normal. Pupils are equal, round, and reactive to light. No scleral icterus.  Poor vision- near total blindness  Neck: Normal range of motion. Neck supple. No JVD present. Carotid bruit is not present. No thyromegaly present.  Cardiovascular: Normal rate, regular rhythm, normal heart sounds and intact distal pulses.  Exam reveals no gallop.   Pulmonary/Chest: Effort normal and breath sounds normal. No respiratory distress. She has no wheezes. She exhibits no tenderness.  Abdominal: Soft. Bowel sounds are normal. She exhibits no distension, no abdominal bruit and no mass. There is no tenderness.  Genitourinary: No breast swelling, tenderness, discharge or bleeding.  Genitourinary Comments: Breast exam: No mass, nodules, thickening, tenderness, bulging, retraction, inflamation, nipple discharge or skin changes noted.  No axillary or clavicular LA.    Exam done sitting   Musculoskeletal: Normal range of motion. She exhibits no edema or tenderness.  Poor rom of knees and spine  Labored gait   Lymphadenopathy:    She has no cervical adenopathy.  Neurological: She is alert. She has normal reflexes. No cranial nerve deficit. She exhibits normal muscle tone. Coordination normal.  Skin: Skin is warm and dry. No rash noted. No erythema. No pallor.  Stable flat brown nevi on trunk   Psychiatric: She has a normal mood and affect.  Cheerful Good insight          Assessment & Plan:   Problem List Items Addressed This Visit      Cardiovascular and Mediastinum   Essential hypertension - Primary    bp in fair control at this time  BP Readings from Last 1 Encounters:  11/17/15 122/72   No changes needed Disc lifstyle change with low sodium diet and exercise  Labs reviewed  Wt loss enc      Relevant Medications   lisinopril (PRINIVIL,ZESTRIL) 5 MG tablet     Other   Chronic pain    Ongoing with knees and back  in the setting of morbid obesity  Continues norco on a schedule Refilled today  No signs of dependence or abuse Continue to follow  Aware wt loss would help      Relevant Medications   HYDROcodone-acetaminophen (NORCO/VICODIN) 5-325 MG tablet   sertraline (ZOLOFT) 50 MG tablet   Elevated glucose level    Lab Results  Component Value Date   HGBA1C 5.6 11/11/2015   Enc low glycemic diet and wt control to prevent DM      Hyperlipidemia    Disc goals for lipids and reasons to control them Rev labs with pt Rev low sat fat diet in detail LDL is up  Disc ways to dec it - laying  off the trans and sat fats  Will continue to follow  Consider statin in the future if needed       Relevant Medications   lisinopril (PRINIVIL,ZESTRIL) 5 MG tablet   Morbid obesity (HCC)    Wt loss is a struggle for her-legally blind and chronic pain making exercise difficult  Discussed how this problem influences overall health and the risks it imposes  Reviewed plan for weight loss with lower calorie diet (via better food choices and also portion control or program like weight watchers) and exercise building up to or more than 30 minutes 5 days per week including some aerobic activity         RETINITIS PIGMENTOSA    Continues to worsen Seen at [facility]  No vision in R eye at all       Routine general medical examination at a health care facility    Reviewed health habits including diet and exercise and skin cancer prevention Reviewed appropriate screening tests for age  Also reviewed health mt list, fam hx and immunization status , as well as social and family history   See HPI Labs reviewed  Enc her to call and schedule AMW (unsure she wants to do this) Get your mammogram as planned  For cholesterol    Avoid red meat/ fried foods/ egg yolks/ fatty breakfast meats/ butter, cheese and high fat dairy/ and shellfish   For blood sugar control- sweets and starchy foods        Other Visit Diagnoses     None.

## 2015-11-17 NOTE — Assessment & Plan Note (Signed)
Wt loss is a struggle for her-legally blind and chronic pain making exercise difficult  Discussed how this problem influences overall health and the risks it imposes  Reviewed plan for weight loss with lower calorie diet (via better food choices and also portion control or program like weight watchers) and exercise building up to or more than 30 minutes 5 days per week including some aerobic activity

## 2015-11-17 NOTE — Assessment & Plan Note (Signed)
Ongoing with knees and back in the setting of morbid obesity  Continues norco on a schedule Refilled today  No signs of dependence or abuse Continue to follow  Aware wt loss would help

## 2015-11-17 NOTE — Assessment & Plan Note (Signed)
Continues to worsen Seen at [facility]  No vision in R eye at all

## 2015-11-19 ENCOUNTER — Ambulatory Visit
Admission: RE | Admit: 2015-11-19 | Discharge: 2015-11-19 | Disposition: A | Discharge status: Home / Self Care | Payer: PPO | Source: Ambulatory Visit | Attending: Family Medicine | Admitting: Family Medicine

## 2015-11-19 DIAGNOSIS — Z1231 Encounter for screening mammogram for malignant neoplasm of breast: Secondary | ICD-10-CM | POA: Diagnosis not present

## 2015-11-25 DIAGNOSIS — M530 Cervicocranial syndrome: Secondary | ICD-10-CM | POA: Diagnosis not present

## 2015-11-25 DIAGNOSIS — M9901 Segmental and somatic dysfunction of cervical region: Secondary | ICD-10-CM | POA: Diagnosis not present

## 2015-12-03 DIAGNOSIS — M7581 Other shoulder lesions, right shoulder: Secondary | ICD-10-CM | POA: Diagnosis not present

## 2015-12-03 DIAGNOSIS — M1712 Unilateral primary osteoarthritis, left knee: Secondary | ICD-10-CM | POA: Diagnosis not present

## 2015-12-15 ENCOUNTER — Encounter: Payer: Self-pay | Admitting: Primary Care

## 2015-12-15 ENCOUNTER — Ambulatory Visit (INDEPENDENT_AMBULATORY_CARE_PROVIDER_SITE_OTHER): Payer: PPO | Admitting: Primary Care

## 2015-12-15 VITALS — BP 134/80 | HR 79 | Temp 98.3°F | Ht 60.25 in | Wt 284.0 lb

## 2015-12-15 DIAGNOSIS — B001 Herpesviral vesicular dermatitis: Secondary | ICD-10-CM | POA: Diagnosis not present

## 2015-12-15 DIAGNOSIS — J069 Acute upper respiratory infection, unspecified: Secondary | ICD-10-CM | POA: Diagnosis not present

## 2015-12-15 MED ORDER — PREDNISONE 20 MG PO TABS: ORAL_TABLET | ORAL | 0 refills | Status: DC

## 2015-12-15 MED ORDER — ACYCLOVIR 5 % EX OINT: TOPICAL_OINTMENT | CUTANEOUS | 0 refills | Status: DC

## 2015-12-15 MED ORDER — AMOXICILLIN-POT CLAVULANATE 875-125 MG PO TABS: 1.0000 | ORAL_TABLET | Freq: Two times a day (BID) | ORAL | 0 refills | Status: DC

## 2015-12-15 MED ORDER — HYDROCOD POLST-CPM POLST ER 10-8 MG/5ML PO SUER: 5.0000 mL | Freq: Two times a day (BID) | ORAL | 0 refills | Status: DC | PRN

## 2015-12-15 NOTE — Progress Notes (Signed)
Subjective:    Patient ID: Monica Madden, female    DOB: 1960-12-03, 55 y.o.   MRN: [id number]  HPI  Monica Madden is a 55 year old female with a history of GERD, allergic rhinitis, asthma, hypertension managed on ACE who presents today with a chief complaint of cough. She also reports chest congestion, nasal congestion, ear fullness to the left ear, wheezing, increased shortness of breath, and headache. Her symptoms have been present since November 19th (8 days).  Her cough is productive and her nasal congestion is thicker. She's been taking OTC Delsym, Alka-Selzter, and cough drops without much improvement.  2) Fever Blisters: History of fever blisters in the past and was managed on Zovirax PRN .She'd like a refill of this medication today as she's noticed a cold sore to the left lower lip that began several days ago. She's tried OTC treatment in the past without improvement.  Review of Systems  Constitutional: Positive for chills and fatigue. Negative for fever.  HENT: Positive for congestion and postnasal drip. Negative for sinus pressure and sore throat.   Respiratory: Positive for cough, shortness of breath and wheezing.   Neurological:       Herpes labialis        Past Medical History:  Diagnosis Date  . Allergy    allergic rhinitis  . Anemia   . Anxiety    ATTACKS  . Arthritis   . Asthma   . Cataract    left cataract removal 12/2008  . Chronic back pain    OA of spine  . Depression   . GERD (gastroesophageal reflux disease)    Endo negative 02/2000  . Hypertension 03/2008  . IBS (irritable bowel syndrome)   . MRSA infection 2008   Right leg  . Obesity   . Ovarian cyst   . Retinitis pigmentosa    blind     Social History   Social History  . Marital status: Married    Spouse name: N/A  . Number of children: N/A  . Years of education: N/A   Occupational History  . Not on file.   Social History Main Topics  . Smoking status: Never Smoker  . Smokeless  tobacco: Never Used  . Alcohol use Yes     Comment: twice a year  . Drug use: No  . Sexual activity: Not on file   Other Topics Concern  . Not on file   Social History Narrative   Vision impaired     Past Surgical History:  Procedure Laterality Date  . APPENDECTOMY    . BLADDER SURGERY  1968   age 65 ? dilitation  . CATARACT EXTRACTION W/PHACO Right 11/11/2014   Procedure: CATARACT EXTRACTION PHACO AND INTRAOCULAR LENS PLACEMENT (IOC);  Surgeon: Sallee Lange, MD;  Location: [facility] ORS;  Service: Ophthalmology;  Laterality: Right;  LOT PACK: [id number] H [place]: 00:29.7 AP: 11.2 CDE: 7.14  . CESAREAN SECTION    . CHOLECYSTECTOMY    . CYSTOSCOPY  03/2008   negative// Dr. Wanda Plump urologist  . ENDOMETRIAL BIOPSY  07/1998   negative  . EYE SURGERY  12/2008   cataract removal left  . HERNIA REPAIR  05/2006   ruptured hernia repair after fall and then two more surgeries within as many weeks for complications(05/2006) Umbilical hernia repair (06/1997)  . KNEE ARTHROSCOPY W/ MENISCAL REPAIR     left knne    Family History  Problem Relation Age of Onset  . Depression Mother   . Heart  disease Father   . Diabetes Maternal Uncle   . Hypertension Maternal Grandmother   . Depression Brother     Depression secondary to MVA injuries  . Hypertension Brother   . Heart disease Paternal Grandfather   . Heart disease Paternal Grandmother     Allergies  Allergen Reactions  . Diclofenac Sodium     REACTION: allergic/ made asthma bad  . Etodolac     REACTION: GI  . Metronidazole     REACTION: GI side eff  . Oxybutynin Itching  . Sulfamethoxazole-Trimethoprim   . Tolterodine Tartrate     REACTION: None Effective  . Tramadol Other (See Comments)    Caused elevated BP    Current Outpatient Prescriptions on File Prior to Visit  Medication Sig Dispense Refill  . albuterol (VENTOLIN HFA) 108 (90 BASE) MCG/ACT inhaler As needed for asthma attack     . ALPRAZolam (XANAX) 1 MG tablet  Take 1 tablet (1 mg total) by mouth 3 (three) times daily as needed. Warning may sedate 60 tablet 3  . Ascorbic Acid (VITAMIN C) 1000 MG tablet Take 1,000 mg by mouth daily.      . Calcium Carbonate (CALCIUM 600) 1500 MG TABS Take as directed.     . Coenzyme Q10 (CO Q 10 PO) Take 1 tablet by mouth daily.    . fexofenadine (ALLEGRA) 180 MG tablet Take 1 tablet (180 mg total) by mouth daily. 30 tablet 11  . fluticasone (FLONASE) 50 MCG/ACT nasal spray Place 2 sprays into both nostrils 2 (two) times daily as needed for allergies or rhinitis. 2 puffs in each nostril daily 48 g 3  . HYDROcodone-acetaminophen (NORCO/VICODIN) 5-325 MG tablet Take 1 tablet by mouth every 6 (six) hours as needed. 90 tablet 0  . lisinopril (PRINIVIL,ZESTRIL) 5 MG tablet Take 1 tablet (5 mg total) by mouth every morning. 90 tablet 3  . montelukast (SINGULAIR) 10 MG tablet Take 1 tablet (10 mg total) by mouth daily. 90 tablet 3  . Omega 3-6-9 Fatty Acids (OMEGA 3-6-9 COMPLEX PO) Take one tablet by mouth Twice a day    . omeprazole (PRILOSEC) 40 MG capsule Take 1 capsule (40 mg total) by mouth daily. 90 capsule 3  . sertraline (ZOLOFT) 50 MG tablet Take 1 tablet (50 mg total) by mouth daily. 90 tablet 3  . tolterodine (DETROL LA) 4 MG 24 hr capsule Take 1 capsule (4 mg total) by mouth daily. 90 capsule 3   No current facility-administered medications on file prior to visit.     BP 134/80   Pulse 79   Temp 98.3 F (36.8 C) (Oral)   Ht 5' 0.25" (1.53 m)   Wt 284 lb (128.8 kg)   SpO2 97%   BMI 55.01 kg/m    Objective:   Physical Exam  Constitutional: She appears well-nourished. She appears ill.  HENT:  Right Ear: Tympanic membrane and ear canal normal.  Left Ear: Tympanic membrane and ear canal normal.  Nose: Mucosal edema present. Right sinus exhibits no maxillary sinus tenderness and no frontal sinus tenderness. Left sinus exhibits no maxillary sinus tenderness and no frontal sinus tenderness.  Mouth/Throat:  Oropharynx is clear and moist.  Small oral lesion to left lower lip representative of herpes labialis.   Eyes: Conjunctivae are normal.  Neck: Neck supple.  Cardiovascular: Normal rate and regular rhythm.   Pulmonary/Chest: Effort normal. She has no decreased breath sounds. She has wheezes in the right upper field, the right lower field, the  left upper field and the left lower field. She has rhonchi in the right upper field, the right lower field, the left upper field and the left lower field. She has no rales.  Lymphadenopathy:    She has no cervical adenopathy.  Skin: Skin is warm and dry.          Assessment & Plan:  Acute Upper Respiratory Infection:  Cough x 8 days. Also with wheezing, SOB, fatigue, chest congestion. No improvement with OTC treatment. Exam today with moderate rhonchi throughout, mild inspiratory wheezing. Suspect asthma exacerbation secondary to URI. Rx for Augmentin course, Prednisone burst sent to pharmacy, Rx printed for Tussionex for cough per patient request. Continue albuterol PRN. Fluids, rest, follow up PRN.  Herpes Labialis:  History of in the past, did well on Zovirax. Exam with evidence of herpes labialis to left lower lip. Rx for Zovirax sent to pharmacy.  Morrie Sheldonlark,Lamija Besse Kendal, NP

## 2015-12-15 NOTE — Progress Notes (Signed)
Pre visit review using our clinic review tool, if applicable. No additional management support is needed unless otherwise documented below in the visit note. 

## 2015-12-15 NOTE — Patient Instructions (Signed)
Start Augmentin antibiotics. Take 1 tablet by mouth twice daily for 10 days.  Start prednisone tablets. Take 2 tablets daily for 5 days.  Continue use of your albuterol inhaler every 6-8 hours as needed for wheezing/shortness of breath.  Ensure you are staying hydrated with water.   You may apply the Zovirax ointment five times daily for 4 days to fever blister.  It was a pleasure meeting you!

## 2015-12-17 ENCOUNTER — Encounter: Payer: Self-pay | Admitting: Family Medicine

## 2015-12-18 MED ORDER — FEXOFENADINE HCL 180 MG PO TABS: 180.0000 mg | ORAL_TABLET | Freq: Every day | ORAL | 3 refills | Status: DC

## 2015-12-21 ENCOUNTER — Other Ambulatory Visit: Payer: Self-pay | Admitting: Family Medicine

## 2015-12-21 ENCOUNTER — Encounter: Payer: Self-pay | Admitting: Family Medicine

## 2015-12-22 MED ORDER — HYDROCODONE-ACETAMINOPHEN 5-325 MG PO TABS: 1.0000 | ORAL_TABLET | Freq: Four times a day (QID) | ORAL | 0 refills | Status: DC | PRN

## 2015-12-22 MED ORDER — FLUTICASONE PROPIONATE 50 MCG/ACT NA SUSP: 2.0000 | Freq: Two times a day (BID) | NASAL | 3 refills | Status: DC | PRN

## 2015-12-22 NOTE — Telephone Encounter (Signed)
Last filled on 11/17/15 #90 with 0 refills, please advise

## 2015-12-22 NOTE — Telephone Encounter (Signed)
Px printed for pick up in IN box  

## 2015-12-22 NOTE — Telephone Encounter (Signed)
Left voicemail letting pt know Rx ready for pick up 

## 2016-01-20 ENCOUNTER — Ambulatory Visit: Payer: PPO

## 2016-01-22 ENCOUNTER — Other Ambulatory Visit: Payer: Self-pay | Admitting: Family Medicine

## 2016-01-22 NOTE — Telephone Encounter (Signed)
Last filled on 12/22/15 #90 with 0 refills

## 2016-01-23 MED ORDER — HYDROCODONE-ACETAMINOPHEN 5-325 MG PO TABS: 1.0000 | ORAL_TABLET | Freq: Four times a day (QID) | ORAL | 0 refills | Status: DC | PRN

## 2016-01-23 NOTE — Telephone Encounter (Signed)
Px printed for pick up in IN box  

## 2016-01-23 NOTE — Telephone Encounter (Signed)
Pt notified Rx ready for pickup 

## 2016-02-25 ENCOUNTER — Other Ambulatory Visit: Payer: Self-pay | Admitting: Family Medicine

## 2016-02-25 MED ORDER — HYDROCODONE-ACETAMINOPHEN 5-325 MG PO TABS: 1.0000 | ORAL_TABLET | Freq: Four times a day (QID) | ORAL | 0 refills | Status: DC | PRN

## 2016-02-25 NOTE — Telephone Encounter (Signed)
She takes this for chronic pain and is on time for px Rev her hx with [facility] -no issues  Px printed for pick up in IN box

## 2016-02-25 NOTE — Telephone Encounter (Signed)
Last filled on 01/23/16 #90 with 0 refills, pt has AWV on 03/23/16, and CPE was done on 11/17/15, please advise

## 2016-02-25 NOTE — Telephone Encounter (Signed)
Called pt and was in the middle speaking to her and our phones were disconnected, called pt back and no answer so left voicemail letting her know Rx ready for pick up and it's time to update her UDS

## 2016-02-26 ENCOUNTER — Encounter: Payer: Self-pay | Admitting: Family Medicine

## 2016-02-27 ENCOUNTER — Encounter: Payer: Self-pay | Admitting: Family Medicine

## 2016-02-27 DIAGNOSIS — M7061 Trochanteric bursitis, right hip: Secondary | ICD-10-CM | POA: Diagnosis not present

## 2016-02-27 DIAGNOSIS — Z79891 Long term (current) use of opiate analgesic: Secondary | ICD-10-CM | POA: Diagnosis not present

## 2016-02-27 DIAGNOSIS — Z79899 Other long term (current) drug therapy: Secondary | ICD-10-CM | POA: Diagnosis not present

## 2016-03-17 ENCOUNTER — Other Ambulatory Visit: Payer: Self-pay | Admitting: Family Medicine

## 2016-03-17 NOTE — Telephone Encounter (Signed)
Px written for call in   Since her px states tid prn I am going to inc monthly amt to 90 - but she should still take only as needed

## 2016-03-17 NOTE — Telephone Encounter (Signed)
Pt had CPE on 11/17/15 and has AWV with Misty StanleyLisa scheduled on 03/23/16, last filled on 11/17/15 #60 tabs with 3 additional refills, please advise

## 2016-03-17 NOTE — Telephone Encounter (Signed)
Rx called in as prescribed 

## 2016-03-23 ENCOUNTER — Ambulatory Visit: Payer: PPO

## 2016-03-24 ENCOUNTER — Encounter: Payer: Self-pay | Admitting: Family Medicine

## 2016-03-24 MED ORDER — HYDROCODONE-ACETAMINOPHEN 5-325 MG PO TABS: 1.0000 | ORAL_TABLET | Freq: Four times a day (QID) | ORAL | 0 refills | Status: DC | PRN

## 2016-03-24 NOTE — Telephone Encounter (Signed)
Pt will be here for AMW tomorrow -she wants to pick up px  Thanks  In IN box

## 2016-03-25 ENCOUNTER — Ambulatory Visit (INDEPENDENT_AMBULATORY_CARE_PROVIDER_SITE_OTHER): Payer: PPO

## 2016-03-25 VITALS — BP 130/82 | HR 84 | Temp 98.1°F | Ht 61.0 in | Wt 277.5 lb

## 2016-03-25 DIAGNOSIS — Z0000 Encounter for general adult medical examination without abnormal findings: Secondary | ICD-10-CM

## 2016-03-25 NOTE — Progress Notes (Signed)
Medical screening examination/treatment/procedure(s) were performed by registered nurse and as non- physician practitioner I was immediately available for consultation/collaboration.  Shaquana Buel, NP   

## 2016-03-25 NOTE — Telephone Encounter (Signed)
Rx placed at front desk for pick up.  

## 2016-03-25 NOTE — Progress Notes (Signed)
Subjective:   Champ MungoJoanne C Tumbleson is a 56 y.o. female who presents for an Initial Medicare Annual Wellness Visit.  Review of Systems    N/A  Cardiac Risk Factors include: advanced age (>2755men, 65>65 women);obesity (BMI >30kg/m2);dyslipidemia;hypertension     Objective:    Today's Vitals   03/25/16 1343  BP: 130/82  Pulse: 84  Temp: 98.1 F (36.7 C)  TempSrc: Oral  SpO2: 98%  Weight: 277 lb 8 oz (125.9 kg)  Height: 5\' 1"  (1.549 m)  PainSc: 4   PainLoc: Knee   Body mass index is 52.43 kg/m.   Current Medications (verified) Outpatient Encounter Prescriptions as of 03/25/2016  Medication Sig  . acyclovir ointment (ZOVIRAX) 5 % Apply to fever blister 5 times daily for 4 days.  Marland Kitchen. albuterol (VENTOLIN HFA) 108 (90 BASE) MCG/ACT inhaler As needed for asthma attack   . ALPRAZolam (XANAX) 1 MG tablet TAKE 1 TABLET BY MOUTH THREE TIMES DAILYAS NEEDED. WARNING MAY SEDATE.  Marland Kitchen. Ascorbic Acid (VITAMIN C) 1000 MG tablet Take 1,000 mg by mouth daily.    . Calcium Carbonate (CALCIUM 600) 1500 MG TABS Take as directed.   . chlorpheniramine-HYDROcodone (TUSSIONEX PENNKINETIC ER) 10-8 MG/5ML SUER Take 5 mLs by mouth every 12 (twelve) hours as needed for cough.  . Coenzyme Q10 (CO Q 10 PO) Take 1 tablet by mouth daily.  . fexofenadine (ALLER-EASE) 180 MG tablet Take 1 tablet (180 mg total) by mouth daily.  . fluticasone (FLONASE) 50 MCG/ACT nasal spray Place 2 sprays into both nostrils 2 (two) times daily as needed for allergies or rhinitis. 2 puffs in each nostril daily  . HYDROcodone-acetaminophen (NORCO/VICODIN) 5-325 MG tablet Take 1 tablet by mouth every 6 (six) hours as needed.  Marland Kitchen. lisinopril (PRINIVIL,ZESTRIL) 5 MG tablet Take 1 tablet (5 mg total) by mouth every morning.  . montelukast (SINGULAIR) 10 MG tablet Take 1 tablet (10 mg total) by mouth daily.  . Omega 3-6-9 Fatty Acids (OMEGA 3-6-9 COMPLEX PO) Take one tablet by mouth Twice a day  . omeprazole (PRILOSEC) 40 MG capsule Take 1 capsule  (40 mg total) by mouth daily.  . sertraline (ZOLOFT) 50 MG tablet Take 1 tablet (50 mg total) by mouth daily.  Marland Kitchen. tolterodine (DETROL LA) 4 MG 24 hr capsule Take 1 capsule (4 mg total) by mouth daily.  . [DISCONTINUED] amoxicillin-clavulanate (AUGMENTIN) 875-125 MG tablet Take 1 tablet by mouth 2 (two) times daily.  . [DISCONTINUED] predniSONE (DELTASONE) 20 MG tablet Take 2 tablets by mouth daily for 5 days.   No facility-administered encounter medications on file as of 03/25/2016.     Allergies (verified) Diclofenac sodium; Etodolac; Metronidazole; Oxybutynin; Sulfamethoxazole-trimethoprim; Tolterodine tartrate; and Tramadol   History: Past Medical History:  Diagnosis Date  . Allergy    allergic rhinitis  . Anemia   . Anxiety    ATTACKS  . Arthritis   . Asthma   . Cataract    left cataract removal 12/2008  . Chronic back pain    OA of spine  . Depression   . GERD (gastroesophageal reflux disease)    Endo negative 02/2000  . Hypertension 03/2008  . IBS (irritable bowel syndrome)   . MRSA infection 2008   Right leg  . Obesity   . Ovarian cyst   . Retinitis pigmentosa    blind   Past Surgical History:  Procedure Laterality Date  . APPENDECTOMY    . BLADDER SURGERY  1968   age 75 ? dilitation  .  CATARACT EXTRACTION W/PHACO Right 11/11/2014   Procedure: CATARACT EXTRACTION PHACO AND INTRAOCULAR LENS PLACEMENT (IOC);  Surgeon: Sallee Lange, MD;  Location: [facility] ORS;  Service: Ophthalmology;  Laterality: Right;  LOT PACK: [id number] H [place]: 00:29.7 AP: 11.2 CDE: 7.14  . CESAREAN SECTION    . CHOLECYSTECTOMY    . CYSTOSCOPY  03/2008   negative// Dr. Wanda Plump urologist  . ENDOMETRIAL BIOPSY  07/1998   negative  . EYE SURGERY  12/2008   cataract removal left  . HERNIA REPAIR  05/2006   ruptured hernia repair after fall and then two more surgeries within as many weeks for complications(05/2006) Umbilical hernia repair (06/1997)  . KNEE ARTHROSCOPY W/ MENISCAL REPAIR      left knne   Family History  Problem Relation Age of Onset  . Depression Mother   . Heart disease Father   . Diabetes Maternal Uncle   . Hypertension Maternal Grandmother   . Depression Brother     Depression secondary to MVA injuries  . Hypertension Brother   . Heart disease Paternal Grandfather   . Heart disease Paternal Grandmother    Social History   Occupational History  . Not on file.   Social History Main Topics  . Smoking status: Never Smoker  . Smokeless tobacco: Never Used  . Alcohol use Yes     Comment: twice a year  . Drug use: No  . Sexual activity: No    Tobacco Counseling Counseling given: No   Activities of Daily Living In your present state of health, do you have any difficulty performing the following activities: 03/25/2016  Hearing? N  Vision? Y  Difficulty concentrating or making decisions? N  Walking or climbing stairs? Y  Dressing or bathing? N  Doing errands, shopping? Y  Preparing Food and eating ? Y  Using the Toilet? N  In the past six months, have you accidently leaked urine? Y  Do you have problems with loss of bowel control? N  Managing your Medications? N  Managing your Finances? N  Housekeeping or managing your Housekeeping? N  Some recent data might be hidden    Immunizations and Health Maintenance Immunization History  Administered Date(s) Administered  . Influenza Split 01/01/2011  . Influenza,inj,Quad PF,36+ Mos 10/30/2014  . Influenza-Unspecified 10/27/2012, 10/21/2015  . Pneumococcal Polysaccharide-23 06/30/2011  . Td 07/28/2004  . Tdap 10/30/2014   There are no preventive care reminders to display for this patient.  Patient Care Team: Judy Pimple, MD as PCP - General     Assessment:   This is a routine wellness examination for Maymie.   Hearing/Vision screen  Hearing Screening   125Hz  250Hz  500Hz  1000Hz  2000Hz  3000Hz  4000Hz  6000Hz  8000Hz   Right ear:   40 40 40  40    Left ear:   40 40 40  40    Vision  Screening Comments: Last exam in July 2017 @ [facility]/[place]  Dietary issues and exercise activities discussed: Current Exercise Habits: Home exercise routine, Type of exercise: Other - see comments;walking (low impact aerobics), Time (Minutes): 30, Frequency (Times/Week): 5, Weekly Exercise (Minutes/Week): 150, Intensity: Moderate, Exercise limited by: orthopedic condition(s)  Goals    . Eat more fruits and vegetables          Starting 03/25/2016, I will continue to eat 4-5 servings of fresh fruits and vegetables.       Depression Screen PHQ 2/9 Scores 03/25/2016 11/17/2015  PHQ - 2 Score 0 0    Fall Risk Fall  Risk  03/25/2016 11/17/2015  Falls in the past year? No No    Cognitive Function: MMSE - Mini Mental State Exam 03/25/2016  Orientation to time 5  Orientation to Place 5  Registration 3  Attention/ Calculation 0  Recall 3  Language- name 2 objects 0  Language- repeat 1  Language- follow 3 step command 0  Language- follow 3 step command-comments this was not assessed due to pt is legally blind  Language- read & follow direction 0  Write a sentence 0  Copy design 0  Total score 17     PLEASE NOTE: A Mini-Cog screen was completed. Maximum score is 17. A value of 0 denotes this part of Folstein MMSE was not completed or the patient failed this part of the Mini-Cog screening.   Mini-Cog Screening Orientation to Time - Max 5 pts Orientation to Place - Max 5 pts Registration - Max 3 pts Recall - Max 3 pts Language Repeat - Max 1 pts      Screening Tests Health Maintenance  Topic Date Due  . HIV Screening  02/20/2023 (Originally 07/13/1975)  . MAMMOGRAM  11/18/2016  . PAP SMEAR  08/26/2017  . COLONOSCOPY  10/03/2019  . TETANUS/TDAP  10/29/2024  . INFLUENZA VACCINE  Addressed  . Hepatitis C Screening  Completed      Plan:  I have personally reviewed and addressed the Medicare Annual Wellness questionnaire and have noted the following in the patient's  chart:  A. Medical and social history B. Use of alcohol, tobacco or illicit drugs  C. Current medications and supplements D. Functional ability and status E.  Nutritional status F.  Physical activity G. Advance directives H. List of other physicians I.  Hospitalizations, surgeries, and ER visits in previous 12 months J.  Vitals K. Screenings to include hearing, vision, cognitive, depression L. Referrals and appointments - none  In addition, I have reviewed and discussed with patient certain preventive protocols, quality metrics, and best practice recommendations. A written personalized care plan for preventive services as well as general preventive health recommendations were provided to patient.  See attached scanned questionnaire for additional information.   Signed,   Randa Evens, MHA, BS, LPN Health Coach

## 2016-03-25 NOTE — Patient Instructions (Addendum)
Ms. Gaye AlkenMelton , Thank you for taking time to come for your Medicare Wellness Visit. I appreciate your ongoing commitment to your health goals. Please review the following plan we discussed and let me know if I can assist you in the future.   These are the goals we discussed: Goals    . Eat more fruits and vegetables          Starting 03/25/2016, I will continue to eat 4-5 servings of fresh fruits and vegetables.        This is a list of the screening recommended for you and due dates:  Health Maintenance  Topic Date Due  . HIV Screening  02/20/2023*  . Mammogram  11/18/2016  . Pap Smear  08/26/2017  . Colon Cancer Screening  10/03/2019  . Tetanus Vaccine  10/29/2024  . Flu Shot  Addressed  .  Hepatitis C: One time screening is recommended by Center for Disease Control  (CDC) for  adults born from [birth year] through 1963.   Completed  *Topic was postponed. The date shown is not the original due date.     Preventive Care for Adults  A healthy lifestyle and preventive care can promote health and wellness. Preventive health guidelines for adults include the following key practices.  . A routine yearly physical is a good way to check with your health care provider about your health and preventive screening. It is a chance to share any concerns and updates on your health and to receive a thorough exam.  . Visit your dentist for a routine exam and preventive care every 6 months. Brush your teeth twice a day and floss once a day. Good oral hygiene prevents tooth decay and gum disease.  . The frequency of eye exams is based on your age, health, family medical history, use  of contact lenses, and other factors. Follow your health care provider's ecommendations for frequency of eye exams.  . Eat a healthy diet. Foods like vegetables, fruits, whole grains, low-fat dairy products, and lean protein foods contain the nutrients you need without too many calories. Decrease your intake of foods high in  solid fats, added sugars, and salt. Eat the right amount of calories for you. Get information about a proper diet from your health care provider, if necessary.  . Regular physical exercise is one of the most important things you can do for your health. Most adults should get at least 150 minutes of moderate-intensity exercise (any activity that increases your heart rate and causes you to sweat) each week. In addition, most adults need muscle-strengthening exercises on 2 or more days a week.  Silver Sneakers may be a benefit available to you. To determine eligibility, you may visit the website: www.silversneakers.com or contact program at 573-879-46871-(469) 023-2014 Mon-Fri between 8AM-8PM.   . Maintain a healthy weight. The body mass index (BMI) is a screening tool to identify possible weight problems. It provides an estimate of body fat based on height and weight. Your health care provider can find your BMI and can help you achieve or maintain a healthy weight.   For adults 20 years and older: ? A BMI below 18.5 is considered underweight. ? A BMI of 18.5 to 24.9 is normal. ? A BMI of 25 to 29.9 is considered overweight. ? A BMI of 30 and above is considered obese.   . Maintain normal blood lipids and cholesterol levels by exercising and minimizing your intake of saturated fat. Eat a balanced diet with plenty of fruit  and vegetables. Blood tests for lipids and cholesterol should begin at age 69 and be repeated every 5 years. If your lipid or cholesterol levels are high, you are over 50, or you are at high risk for heart disease, you may need your cholesterol levels checked more frequently. Ongoing high lipid and cholesterol levels should be treated with medicines if diet and exercise are not working.  . If you smoke, find out from your health care provider how to quit. If you do not use tobacco, please do not start.  . If you choose to drink alcohol, please do not consume more than 2 drinks per day. One drink  is considered to be 12 ounces (355 mL) of beer, 5 ounces (148 mL) of wine, or 1.5 ounces (44 mL) of liquor.  . If you are 42-74 years old, ask your health care provider if you should take aspirin to prevent strokes.  . Use sunscreen. Apply sunscreen liberally and repeatedly throughout the day. You should seek shade when your shadow is shorter than you. Protect yourself by wearing long sleeves, pants, a wide-brimmed hat, and sunglasses year round, whenever you are outdoors.  . Once a month, do a whole body skin exam, using a mirror to look at the skin on your back. Tell your health care provider of new moles, moles that have irregular borders, moles that are larger than a pencil eraser, or moles that have changed in shape or color.    --

## 2016-03-25 NOTE — Progress Notes (Signed)
PCP notes:   Health maintenance:  No gaps identified.  Abnormal screenings:   None  Patient concerns:   Pt has complaint of bilateral knee pain. Pain scale: 4/10.   Urinary Incontinence Short Questionnaire 1. How often do you leak urine? About once a week or less often 2. How much urine do you normally leak (whether you wear protection or not)? A small amount 3. Overall, how much does leaking urine interfere with your everyday life?  Please circle a number between 0(not at all) and 10(a great deal). Patient's response: 0 4. When does urine leak?  Leaks when I cough or sneeze  Nurse concerns:  None  Next PCP appt:   11/19/16 @ 1430

## 2016-03-25 NOTE — Progress Notes (Signed)
Pre visit review using our clinic review tool, if applicable. No additional management support is needed unless otherwise documented below in the visit note. 

## 2016-04-05 DIAGNOSIS — M170 Bilateral primary osteoarthritis of knee: Secondary | ICD-10-CM | POA: Diagnosis not present

## 2016-04-27 ENCOUNTER — Other Ambulatory Visit: Payer: Self-pay | Admitting: Family Medicine

## 2016-04-27 MED ORDER — HYDROCODONE-ACETAMINOPHEN 5-325 MG PO TABS: 1.0000 | ORAL_TABLET | Freq: Four times a day (QID) | ORAL | 0 refills | Status: DC | PRN

## 2016-04-27 NOTE — Telephone Encounter (Signed)
Pt notified Rx ready for pickup 

## 2016-04-27 NOTE — Telephone Encounter (Signed)
Px printed for pick up in IN box  

## 2016-04-27 NOTE — Telephone Encounter (Signed)
Pt has CPE scheduled on11/2/18, last filled on 03/24/16 #90 tabs with 0 refills, please advise

## 2016-05-28 ENCOUNTER — Other Ambulatory Visit: Payer: Self-pay | Admitting: Family Medicine

## 2016-05-28 MED ORDER — HYDROCODONE-ACETAMINOPHEN 5-325 MG PO TABS: 1.0000 | ORAL_TABLET | Freq: Four times a day (QID) | ORAL | 0 refills | Status: DC | PRN

## 2016-05-28 NOTE — Telephone Encounter (Signed)
Routing to another provider since Dr. Milinda Antisower is out of the office until next Wednesday.   Last filled on 04/27/16 #90 with 0 refills, pt has CPE scheduled for 11/19/16

## 2016-05-28 NOTE — Telephone Encounter (Signed)
02/26/16 controlled substance contract signed, uds sample given, low risk next screen 02/25/17.

## 2016-06-30 ENCOUNTER — Other Ambulatory Visit: Payer: Self-pay | Admitting: Family Medicine

## 2016-06-30 MED ORDER — HYDROCODONE-ACETAMINOPHEN 5-325 MG PO TABS: 1.0000 | ORAL_TABLET | Freq: Four times a day (QID) | ORAL | 0 refills | Status: DC | PRN

## 2016-06-30 NOTE — Telephone Encounter (Signed)
[facility]  Last refilled 05/28/2016 for #90 with no refills by Dr. Ermalene SearingBedsole.  Ok to refill?

## 2016-06-30 NOTE — Telephone Encounter (Signed)
Pt notified Rx ready for pick up, I advise her of new STOP act and f/u appt scheduled

## 2016-06-30 NOTE — Telephone Encounter (Signed)
Please let her know that regarding the new narcotics law in [place] - I will have to start seeing her every 3 months for a visit for chronic narcotic prescribing I will refill times one and please schedule f/u for roughly when she thinks she will need it next thanks

## 2016-07-19 ENCOUNTER — Other Ambulatory Visit: Payer: Self-pay | Admitting: Family Medicine

## 2016-07-19 NOTE — Telephone Encounter (Signed)
Rx called in as prescribed 

## 2016-07-19 NOTE — Telephone Encounter (Signed)
Pt has a STOP act appt schedule for 08/04/16, last filled on 03/17/16 #90 tabs with 3 additional refills, please advise

## 2016-07-19 NOTE — Telephone Encounter (Signed)
Px written for call in   

## 2016-08-04 ENCOUNTER — Ambulatory Visit (INDEPENDENT_AMBULATORY_CARE_PROVIDER_SITE_OTHER): Payer: Self-pay | Admitting: Family Medicine

## 2016-08-04 ENCOUNTER — Encounter: Payer: Self-pay | Admitting: Family Medicine

## 2016-08-04 VITALS — BP 128/70 | HR 81 | Temp 98.5°F | Ht 60.25 in | Wt 284.5 lb

## 2016-08-04 DIAGNOSIS — G8929 Other chronic pain: Secondary | ICD-10-CM

## 2016-08-04 DIAGNOSIS — R52 Pain, unspecified: Secondary | ICD-10-CM

## 2016-08-04 MED ORDER — HYDROCODONE-ACETAMINOPHEN 5-325 MG PO TABS: 1.0000 | ORAL_TABLET | Freq: Four times a day (QID) | ORAL | 0 refills | Status: DC | PRN

## 2016-08-04 NOTE — Patient Instructions (Signed)
Here are your next 3 norco px  See you in 3 months  Take care of yourself

## 2016-08-04 NOTE — Progress Notes (Signed)
Subjective:    Patient ID: Monica Madden, female    DOB: 01-Mar-1960, 56 y.o.   MRN: [id number]  HPI Indication for chronic opioid: ongoing knee and back pain/ chronic in the setting of morbid obesity  Wt Readings from Last 3 Encounters:  08/04/16 284 lb 8 oz (129 kg)  03/25/16 277 lb 8 oz (125.9 kg)  12/15/15 284 lb (128.8 kg)   55.10 kg/m  She saw orthopedics at [facility] (Dr Rosita Kea and Milon Score PA) for knee injections earlier this month- it helps temporarily (usually about 3 weeks) Disc gel shots - her insurance will not pay  It was noted that she has menicus tear in L knee seen on MRI as well as degenerative changes  R knee pain is more recent from deg changes  She is not interested in surgery at this time  Xray shows OA in both knees   Records were rev through care everywhere today   Medication and dose: norco 5-325 1 po every 6 hours prn  No side effects  No constipation or sedation     # pills per month: 90 Has 7 left in her bottle right now    Last UDS date: 02/27/16 Low risk  THC detected was expected -she takes it for vision  Alprazolam detected and was also expected    Pain contract signed (Y/N): yes signed 03/08/14   Date narcotic database last reviewed (include red flags): 08/04/16 no red flags Last refill of 90 pills was 06/30/16   Patient Active Problem List   Diagnosis Date Noted  . Encounter for pain management 08/05/2016  . Chronic pain 11/17/2015  . Elevated glucose level 11/09/2015  . Need for hepatitis C screening test 10/20/2014  . Routine general medical examination at a health care facility 06/30/2011  . Amenorrhea 06/30/2011  . Adverse effects of medication 07/01/2010  . Right shoulder pain 07/01/2010  . DYSPEPSIA 09/26/2009  . IRRITABLE BOWEL SYNDROME 08/07/2009  . KNEE PAIN, BILATERAL 08/07/2009  . CONSTIPATION 01/15/2009  . LEG PAIN, LEFT 10/09/2008  . Hyperlipidemia 05/09/2008  . Essential hypertension 03/29/2008  . CNTC  DERMATITIS&OTH ECZEMA DUE OTH CHEM PRODUCTS 06/05/2007  . INCI HERNIA WITHOUT MENTION OBSTRUCTION/GANGRENE 05/12/2007  . FREQUENCY, URINARY 05/12/2007  . ARTHRALGIA 11/03/2006  . DEPENDENT EDEMA, LEGS 11/03/2006  . Morbid obesity (HCC) 07/15/2006  . ANXIETY DEPRESSION 07/15/2006  . MIGRAINE HEADACHE 07/15/2006  . RETINITIS PIGMENTOSA 07/15/2006  . VARICOSE VEINS, LOWER EXTREMITIES 07/15/2006  . ALLERGIC RHINITIS 07/15/2006  . ASTHMA 07/15/2006  . GERD 07/15/2006  . BACK PAIN, LUMBAR, CHRONIC 07/15/2006  . INCONTINENCE, URGE 07/15/2006   Past Medical History:  Diagnosis Date  . Allergy    allergic rhinitis  . Anemia   . Anxiety    ATTACKS  . Arthritis   . Asthma   . Cataract    left cataract removal 12/2008  . Chronic back pain    OA of spine  . Depression   . GERD (gastroesophageal reflux disease)    Endo negative 02/2000  . Hypertension 03/2008  . IBS (irritable bowel syndrome)   . MRSA infection 2008   Right leg  . Obesity   . Ovarian cyst   . Retinitis pigmentosa    blind   Past Surgical History:  Procedure Laterality Date  . APPENDECTOMY    . BLADDER SURGERY  1968   age 33 ? dilitation  . CATARACT EXTRACTION W/PHACO Right 11/11/2014   Procedure: CATARACT EXTRACTION PHACO AND INTRAOCULAR LENS PLACEMENT (  IOC);  Surgeon: Sallee Lange, MD;  Location: [facility] ORS;  Service: Ophthalmology;  Laterality: Right;  LOT PACK: [id number] H [place]: 00:29.7 AP: 11.2 CDE: 7.14  . CESAREAN SECTION    . CHOLECYSTECTOMY    . CYSTOSCOPY  03/2008   negative// Dr. Wanda Plump urologist  . ENDOMETRIAL BIOPSY  07/1998   negative  . EYE SURGERY  12/2008   cataract removal left  . HERNIA REPAIR  05/2006   ruptured hernia repair after fall and then two more surgeries within as many weeks for complications(05/2006) Umbilical hernia repair (06/1997)  . KNEE ARTHROSCOPY W/ MENISCAL REPAIR     left knne   Social History  Substance Use Topics  . Smoking status: Never Smoker  . Smokeless  tobacco: Never Used  . Alcohol use Yes     Comment: twice a year   Family History  Problem Relation Age of Onset  . Depression Mother   . Heart disease Father   . Diabetes Maternal Uncle   . Hypertension Maternal Grandmother   . Depression Brother        Depression secondary to MVA injuries  . Hypertension Brother   . Heart disease Paternal Grandfather   . Heart disease Paternal Grandmother    Allergies  Allergen Reactions  . Diclofenac Sodium     REACTION: allergic/ made asthma bad  . Etodolac     REACTION: GI  . Metronidazole     REACTION: GI side eff  . Oxybutynin Itching  . Sulfamethoxazole-Trimethoprim   . Tolterodine Tartrate     REACTION: None Effective  . Tramadol Other (See Comments)    Caused elevated BP   Current Outpatient Prescriptions on File Prior to Visit  Medication Sig Dispense Refill  . albuterol (VENTOLIN HFA) 108 (90 BASE) MCG/ACT inhaler As needed for asthma attack     . ALPRAZolam (XANAX) 1 MG tablet TAKE ONE TABLET BY MOUTH 3 TIMES DAILY AS NEEDED 90 tablet 0  . Ascorbic Acid (VITAMIN C) 1000 MG tablet Take 1,000 mg by mouth daily.      . Calcium Carbonate (CALCIUM 600) 1500 MG TABS Take as directed.     . Coenzyme Q10 (CO Q 10 PO) Take 1 tablet by mouth daily.    . fexofenadine (ALLER-EASE) 180 MG tablet Take 1 tablet (180 mg total) by mouth daily. 90 tablet 3  . fluticasone (FLONASE) 50 MCG/ACT nasal spray Place 2 sprays into both nostrils 2 (two) times daily as needed for allergies or rhinitis. 2 puffs in each nostril daily 48 g 3  . lisinopril (PRINIVIL,ZESTRIL) 5 MG tablet Take 1 tablet (5 mg total) by mouth every morning. 90 tablet 3  . montelukast (SINGULAIR) 10 MG tablet Take 1 tablet (10 mg total) by mouth daily. 90 tablet 3  . Omega 3-6-9 Fatty Acids (OMEGA 3-6-9 COMPLEX PO) Take one tablet by mouth daily    . omeprazole (PRILOSEC) 40 MG capsule Take 1 capsule (40 mg total) by mouth daily. 90 capsule 3  . sertraline (ZOLOFT) 50 MG tablet  Take 1 tablet (50 mg total) by mouth daily. 90 tablet 3  . tolterodine (DETROL LA) 4 MG 24 hr capsule Take 1 capsule (4 mg total) by mouth daily. (Patient taking differently: Take 4 mg by mouth daily as needed. ) 90 capsule 3  . acyclovir ointment (ZOVIRAX) 5 % Apply to fever blister 5 times daily for 4 days. (Patient not taking: Reported on 08/04/2016) 15 g 0   No current facility-administered medications on file  prior to visit.     Review of Systems    Review of Systems  Constitutional: Negative for fever, appetite change, fatigue and unexpected weight change.  Eyes: Negative for pain and pos for legal blindness for retinitis pigmentosa   Respiratory: Negative for cough and shortness of breath.   Cardiovascular: Negative for cp or palpitations    Gastrointestinal: Negative for nausea, diarrhea and constipation.  Genitourinary: Negative for urgency and frequency.  Skin: Negative for pallor or rash   MSK pos for pain in knees and back  Neurological: Negative for weakness, light-headedness, numbness and headaches.  Hematological: Negative for adenopathy. Does not bruise/bleed easily.  Psychiatric/Behavioral: Negative for dysphoric mood. The patient is not nervous/anxious.      Objective:   Physical Exam  Constitutional: She appears well-developed and well-nourished. No distress.  Morbidly obese and well app  HENT:  Head: Normocephalic and atraumatic.  Mouth/Throat: Oropharynx is clear and moist.  Eyes: Pupils are equal, round, and reactive to light. Conjunctivae and EOM are normal.  Legally blind  req assistance to maneuver hallways   Neck: Normal range of motion. Neck supple.  Cardiovascular: Normal rate and regular rhythm.   Pulmonary/Chest: Effort normal and breath sounds normal.  Musculoskeletal: She exhibits tenderness. She exhibits no edema.  Poor rom of knees with general tenderness  Lymphadenopathy:    She has no cervical adenopathy.  Neurological: She is alert. She has  normal reflexes. She displays no atrophy and no tremor. No cranial nerve deficit. She exhibits normal muscle tone. Coordination normal.  Skin: Skin is warm and dry. No rash noted. No erythema. No pallor.  Psychiatric: She has a normal mood and affect.  Cheerful and talkative           Assessment & Plan:   Problem List Items Addressed This Visit      Other   Chronic pain - Primary    Both knees with meniscal tear on L and OA bilat Sees ortho and cortisone inj helps shortly  Cannot afford gel injection Pain management visit today for norco  No problems - px written for 3 months separately       Relevant Medications   HYDROcodone-acetaminophen (NORCO/VICODIN) 5-325 MG tablet   Encounter for pain management    Pt is stable with 90 norco per mo for chronic pain  Contract signed and UDS is up to date Rev NCCS  No problems or red flags safey and habit discussed  Px written for 3 px (post dated) to cover 3 mo  F/u 3 mo

## 2016-08-05 DIAGNOSIS — R52 Pain, unspecified: Secondary | ICD-10-CM | POA: Insufficient documentation

## 2016-08-05 NOTE — Assessment & Plan Note (Signed)
Both knees with meniscal tear on L and OA bilat Sees ortho and cortisone inj helps shortly  Cannot afford gel injection Pain management visit today for norco  No problems - px written for 3 months separately

## 2016-08-05 NOTE — Assessment & Plan Note (Signed)
Pt is stable with 90 norco per mo for chronic pain  Contract signed and UDS is up to date Rev NCCS  No problems or red flags safey and habit discussed  Px written for 3 px (post dated) to cover 3 mo  F/u 3 mo

## 2016-08-13 DIAGNOSIS — H26493 Other secondary cataract, bilateral: Secondary | ICD-10-CM | POA: Diagnosis not present

## 2016-08-16 ENCOUNTER — Other Ambulatory Visit: Payer: Self-pay | Admitting: Family Medicine

## 2016-08-17 NOTE — Telephone Encounter (Signed)
Rx called in as prescribed 

## 2016-08-17 NOTE — Telephone Encounter (Signed)
CPE scheduled 11/19/16, last filled on 07/19/16 #90 tabs with 0 refills, please advise

## 2016-08-17 NOTE — Telephone Encounter (Signed)
Px written for call in   

## 2016-10-19 DIAGNOSIS — J4520 Mild intermittent asthma, uncomplicated: Secondary | ICD-10-CM | POA: Diagnosis not present

## 2016-10-19 DIAGNOSIS — R05 Cough: Secondary | ICD-10-CM | POA: Diagnosis not present

## 2016-10-19 DIAGNOSIS — Z23 Encounter for immunization: Secondary | ICD-10-CM | POA: Diagnosis not present

## 2016-10-25 ENCOUNTER — Other Ambulatory Visit: Payer: Self-pay | Admitting: Family Medicine

## 2016-10-25 DIAGNOSIS — Z1231 Encounter for screening mammogram for malignant neoplasm of breast: Secondary | ICD-10-CM

## 2016-11-02 DIAGNOSIS — M170 Bilateral primary osteoarthritis of knee: Secondary | ICD-10-CM | POA: Diagnosis not present

## 2016-11-07 ENCOUNTER — Telehealth: Payer: Self-pay | Admitting: Family Medicine

## 2016-11-07 DIAGNOSIS — E7800 Pure hypercholesterolemia, unspecified: Secondary | ICD-10-CM

## 2016-11-07 DIAGNOSIS — I10 Essential (primary) hypertension: Secondary | ICD-10-CM

## 2016-11-07 NOTE — Telephone Encounter (Signed)
-----   Message from Alvina Chouerri J Walsh sent at 11/01/2016  3:17 PM EDT ----- Regarding: Lab orders for Wednesday, 10.24.18 Lab orders, pain management appt same day with you

## 2016-11-10 ENCOUNTER — Encounter: Payer: Self-pay | Admitting: Family Medicine

## 2016-11-10 ENCOUNTER — Ambulatory Visit (INDEPENDENT_AMBULATORY_CARE_PROVIDER_SITE_OTHER): Payer: PPO | Admitting: Family Medicine

## 2016-11-10 ENCOUNTER — Other Ambulatory Visit (INDEPENDENT_AMBULATORY_CARE_PROVIDER_SITE_OTHER): Payer: PPO

## 2016-11-10 VITALS — BP 114/72 | HR 80 | Temp 98.0°F | Ht 60.25 in | Wt 284.8 lb

## 2016-11-10 DIAGNOSIS — E7800 Pure hypercholesterolemia, unspecified: Secondary | ICD-10-CM

## 2016-11-10 DIAGNOSIS — I10 Essential (primary) hypertension: Secondary | ICD-10-CM

## 2016-11-10 DIAGNOSIS — M25562 Pain in left knee: Secondary | ICD-10-CM | POA: Diagnosis not present

## 2016-11-10 DIAGNOSIS — G8929 Other chronic pain: Secondary | ICD-10-CM

## 2016-11-10 DIAGNOSIS — R52 Pain, unspecified: Secondary | ICD-10-CM

## 2016-11-10 DIAGNOSIS — M25561 Pain in right knee: Secondary | ICD-10-CM | POA: Diagnosis not present

## 2016-11-10 LAB — CBC WITH DIFFERENTIAL/PLATELET
BASOS PCT: 0.7 % (ref 0.0–3.0)
Basophils Absolute: 0.1 10*3/uL (ref 0.0–0.1)
EOS ABS: 0.3 10*3/uL (ref 0.0–0.7)
EOS PCT: 3.4 % (ref 0.0–5.0)
HCT: 40.8 % (ref 36.0–46.0)
HEMOGLOBIN: 13.5 g/dL (ref 12.0–15.0)
LYMPHS ABS: 2.1 10*3/uL (ref 0.7–4.0)
Lymphocytes Relative: 21.7 % (ref 12.0–46.0)
MCHC: 33 g/dL (ref 30.0–36.0)
MCV: 89.3 fl (ref 78.0–100.0)
MONO ABS: 0.7 10*3/uL (ref 0.1–1.0)
Monocytes Relative: 6.9 % (ref 3.0–12.0)
NEUTROS PCT: 67.3 % (ref 43.0–77.0)
Neutro Abs: 6.4 10*3/uL (ref 1.4–7.7)
PLATELETS: 268 10*3/uL (ref 150.0–400.0)
RBC: 4.57 Mil/uL (ref 3.87–5.11)
RDW: 14 % (ref 11.5–15.5)
WBC: 9.5 10*3/uL (ref 4.0–10.5)

## 2016-11-10 LAB — COMPREHENSIVE METABOLIC PANEL
ALBUMIN: 4.2 g/dL (ref 3.5–5.2)
ALT: 16 U/L (ref 0–35)
AST: 13 U/L (ref 0–37)
Alkaline Phosphatase: 69 U/L (ref 39–117)
BUN: 21 mg/dL (ref 6–23)
CHLORIDE: 100 meq/L (ref 96–112)
CO2: 31 meq/L (ref 19–32)
CREATININE: 0.7 mg/dL (ref 0.40–1.20)
Calcium: 9.7 mg/dL (ref 8.4–10.5)
GFR: 92.57 mL/min (ref >60.00)
GLUCOSE: 95 mg/dL (ref 70–99)
POTASSIUM: 5 meq/L (ref 3.5–5.1)
SODIUM: 137 meq/L (ref 135–145)
Total Bilirubin: 0.5 mg/dL (ref 0.2–1.2)
Total Protein: 7.1 g/dL (ref 6.0–8.3)

## 2016-11-10 LAB — TSH: TSH: 1.36 u[IU]/mL (ref 0.35–4.50)

## 2016-11-10 LAB — LIPID PANEL
CHOL/HDL RATIO: 4
CHOLESTEROL: 195 mg/dL (ref 0–200)
HDL: 48.6 mg/dL (ref >39.00)
LDL CALC: 124 mg/dL — AB (ref 0–99)
NONHDL: 146.34
Triglycerides: 113 mg/dL (ref 0.0–149.0)
VLDL: 22.6 mg/dL (ref 0.0–40.0)

## 2016-11-10 MED ORDER — HYDROCODONE-ACETAMINOPHEN 5-325 MG PO TABS: 1.0000 | ORAL_TABLET | Freq: Four times a day (QID) | ORAL | 0 refills | Status: DC | PRN

## 2016-11-10 NOTE — Assessment & Plan Note (Signed)
Enc to keep working on healthy diet and exercise as tol for wt loss  This would help orthopedic issues

## 2016-11-10 NOTE — Assessment & Plan Note (Signed)
Pt takes norco for chronic knee pain that has failed other tx opt  90 per mo  Rev NCCS UDS up to date  No concerns  3 monthly px written  Fu for next enc 3 mo

## 2016-11-10 NOTE — Assessment & Plan Note (Signed)
Bilateral  Knees with OA and meniscal tear  Recent injections  Refill norco today

## 2016-11-10 NOTE — Assessment & Plan Note (Signed)
S/p recent injections of cortisone Her ins will not pay for lubricant inj unfortunately complic by morbid obesity  Limits mobility  Enc for pain man today-refilled norco Overall stable

## 2016-11-10 NOTE — Patient Instructions (Signed)
Here are your next 3 px for norco  Take only as much as you need

## 2016-11-10 NOTE — Progress Notes (Signed)
Subjective:    Patient ID: Monica Madden, female    DOB: 04-Jul-1960, 56 y.o.   MRN: [id number]  HPI Indication for chronic opioid: chronic pain in both knees with OA and also meniscal tear on the L  Had had cortisone injections Cannot afford lubricant injections  Is morbidly obese   Doing about the same  Some housework and going to the movies  Daughter is doing very well   Last cortisone shots were last week  It helps some  Has good and bad days depending on weather and activity     Medication and dose: norco 5-325 mg 1 tablet every 6 hours prn Takes 2-3 per day depending on the day   Has 9 pills left in the bottle   # pills per month: 90   Last UDS date: 02/27/16 She also has THC for vision (retinitis pigmentosa)  As well as xanax    Pain contract signed (Y/N): signed 02/26/16 Low risk    Date narcotic database last reviewed (include red flags): today/no concerns  Last refilled on 10/08/16  Wt Readings from Last 3 Encounters:  11/10/16 284 lb 12 oz (129.2 kg)  08/04/16 284 lb 8 oz (129 kg)  03/25/16 277 lb 8 oz (125.9 kg)   55.15 kg/m  Coming nov 2 for PE  Had labs today  Had her flu shot   Patient Active Problem List   Diagnosis Date Noted  . Encounter for pain management 08/05/2016  . Chronic pain 11/17/2015  . Elevated glucose level 11/09/2015  . Need for hepatitis C screening test 10/20/2014  . Routine general medical examination at a health care facility 06/30/2011  . Amenorrhea 06/30/2011  . Adverse effects of medication 07/01/2010  . Right shoulder pain 07/01/2010  . DYSPEPSIA 09/26/2009  . IRRITABLE BOWEL SYNDROME 08/07/2009  . KNEE PAIN, BILATERAL 08/07/2009  . CONSTIPATION 01/15/2009  . LEG PAIN, LEFT 10/09/2008  . Hyperlipidemia 05/09/2008  . Essential hypertension 03/29/2008  . 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 06/05/2007  . INCI HERNIA WITHOUT MENTION OBSTRUCTION/GANGRENE 05/12/2007  . FREQUENCY, URINARY 05/12/2007  .  ARTHRALGIA 11/03/2006  . DEPENDENT EDEMA, LEGS 11/03/2006  . Morbid obesity (HCC) 07/15/2006  . ANXIETY DEPRESSION 07/15/2006  . MIGRAINE HEADACHE 07/15/2006  . RETINITIS PIGMENTOSA 07/15/2006  . VARICOSE VEINS, LOWER EXTREMITIES 07/15/2006  . ALLERGIC RHINITIS 07/15/2006  . ASTHMA 07/15/2006  . GERD 07/15/2006  . BACK PAIN, LUMBAR, CHRONIC 07/15/2006  . INCONTINENCE, URGE 07/15/2006   Past Medical History:  Diagnosis Date  . Allergy    allergic rhinitis  . Anemia   . Anxiety    ATTACKS  . Arthritis   . Asthma   . Cataract    left cataract removal 12/2008  . Chronic back pain    OA of spine  . Depression   . GERD (gastroesophageal reflux disease)    Endo negative 02/2000  . Hypertension 03/2008  . IBS (irritable bowel syndrome)   . MRSA infection 2008   Right leg  . Obesity   . Ovarian cyst   . Retinitis pigmentosa    blind   Past Surgical History:  Procedure Laterality Date  . APPENDECTOMY    . BLADDER SURGERY  1968   age 28 ? dilitation  . CATARACT EXTRACTION W/PHACO Right 11/11/2014   Procedure: CATARACT EXTRACTION PHACO AND INTRAOCULAR LENS PLACEMENT (IOC);  Surgeon: Sallee Lange, MD;  Location: [facility] OR[facility];  Service: Ophthalmology;  Laterality: Right;  LOT PACK: [id number] H [place]: 00:29.7 AP:  11.2 CDE: 7.14  . CESAREAN SECTION    . CHOLECYSTECTOMY    . CYSTOSCOPY  03/2008   negative// Dr. Wanda PlumpHumphries urologist  . ENDOMETRIAL BIOPSY  07/1998   negative  . EYE SURGERY  12/2008   cataract removal left  . HERNIA REPAIR  05/2006   ruptured hernia repair after fall and then two more surgeries within as many weeks for complications(05/2006) Umbilical hernia repair (06/1997)  . KNEE ARTHROSCOPY W/ MENISCAL REPAIR     left knne   Social History  Substance Use Topics  . Smoking status: Never Smoker  . Smokeless tobacco: Never Used  . Alcohol use Yes     Comment: twice a year   Family History  Problem Relation Age of Onset  . Depression Mother   . Heart  disease Father   . Diabetes Maternal Uncle   . Hypertension Maternal Grandmother   . Depression Brother        Depression secondary to MVA injuries  . Hypertension Brother   . Heart disease Paternal Grandfather   . Heart disease Paternal Grandmother    Allergies  Allergen Reactions  . Diclofenac Sodium     REACTION: allergic/ made asthma bad  . Etodolac     REACTION: GI  . Metronidazole     REACTION: GI side eff  . Oxybutynin Itching  . Sulfamethoxazole-Trimethoprim   . Tolterodine Tartrate     REACTION: None Effective  . Tramadol Other (See Comments)    Caused elevated BP   Current Outpatient Prescriptions on File Prior to Visit  Medication Sig Dispense Refill  . acyclovir ointment (ZOVIRAX) 5 % Apply to fever blister 5 times daily for 4 days. 15 g 0  . albuterol (VENTOLIN HFA) 108 (90 BASE) MCG/ACT inhaler As needed for asthma attack     . ALPRAZolam (XANAX) 1 MG tablet TAKE ONE TABLET 3 TIMES A DAY AS NEEDED. 90 tablet 3  . Ascorbic Acid (VITAMIN C) 1000 MG tablet Take 1,000 mg by mouth daily.      . Calcium Carbonate (CALCIUM 600) 1500 MG TABS Take as directed.     . Coenzyme Q10 (CO Q 10 PO) Take 1 tablet by mouth daily.    . fexofenadine (ALLER-EASE) 180 MG tablet Take 1 tablet (180 mg total) by mouth daily. 90 tablet 3  . fluticasone (FLONASE) 50 MCG/ACT nasal spray Place 2 sprays into both nostrils 2 (two) times daily as needed for allergies or rhinitis. 2 puffs in each nostril daily 48 g 3  . lisinopril (PRINIVIL,ZESTRIL) 5 MG tablet Take 1 tablet (5 mg total) by mouth every morning. 90 tablet 3  . montelukast (SINGULAIR) 10 MG tablet Take 1 tablet (10 mg total) by mouth daily. 90 tablet 3  . Omega 3-6-9 Fatty Acids (OMEGA 3-6-9 COMPLEX PO) Take one tablet by mouth daily    . omeprazole (PRILOSEC) 40 MG capsule Take 1 capsule (40 mg total) by mouth daily. 90 capsule 3  . sertraline (ZOLOFT) 50 MG tablet Take 1 tablet (50 mg total) by mouth daily. 90 tablet 3  .  tolterodine (DETROL LA) 4 MG 24 hr capsule Take 1 capsule (4 mg total) by mouth daily. (Patient taking differently: Take 4 mg by mouth daily as needed. ) 90 capsule 3   No current facility-administered medications on file prior to visit.     Review of Systems  Constitutional: Negative for activity change, appetite change, fatigue, fever and unexpected weight change.  HENT: Negative for congestion, ear  pain, rhinorrhea, sinus pressure and sore throat.   Eyes: Negative for pain, redness and visual disturbance.  Respiratory: Negative for cough, shortness of breath and wheezing.   Cardiovascular: Negative for chest pain and palpitations.  Gastrointestinal: Negative for abdominal pain, blood in stool, constipation and diarrhea.  Endocrine: Negative for polydipsia and polyuria.  Genitourinary: Negative for dysuria, frequency and urgency.  Musculoskeletal: Positive for arthralgias and back pain. Negative for myalgias.       Pos for bilat knee pain   Skin: Negative for pallor and rash.  Allergic/Immunologic: Negative for environmental allergies.  Neurological: Negative for dizziness, syncope and headaches.  Hematological: Negative for adenopathy. Does not bruise/bleed easily.  Psychiatric/Behavioral: Negative for decreased concentration and dysphoric mood. The patient is not nervous/anxious.        Objective:   Physical Exam  Constitutional: She appears well-developed and well-nourished. No distress.  Morbidly obese and well app  Eyes: Conjunctivae are normal.  Baseline vision impairment   Neck: Normal range of motion. Neck supple.  Cardiovascular: Normal rate, regular rhythm and normal heart sounds.   Pulmonary/Chest: Effort normal and breath sounds normal.  Musculoskeletal: She exhibits no edema.  Limited rom of bilat knees w/o swelling or redness  Walks with cane   Lymphadenopathy:    She has no cervical adenopathy.  Neurological: She is alert. She displays no tremor. No cranial  nerve deficit. She exhibits normal muscle tone. Coordination normal.  Skin: Skin is warm and dry. No rash noted. No pallor.  Psychiatric: She has a normal mood and affect.          Assessment & Plan:   Problem List Items Addressed This Visit      Other   Chronic pain - Primary    Bilateral  Knees with OA and meniscal tear  Recent injections  Refill norco today      Relevant Medications   HYDROcodone-acetaminophen (NORCO/VICODIN) 5-325 MG tablet   Encounter for pain management    Pt takes norco for chronic knee pain that has failed other tx opt  90 per mo  Rev NCCS UDS up to date  No concerns  3 monthly px written  Fu for next enc 3 mo       KNEE PAIN, BILATERAL    S/p recent injections of cortisone Her ins will not pay for lubricant inj unfortunately complic by morbid obesity  Limits mobility  Enc for pain man today-refilled norco Overall stable      Morbid obesity (HCC)    Enc to keep working on healthy diet and exercise as tol for wt loss  This would help orthopedic issues

## 2016-11-11 ENCOUNTER — Other Ambulatory Visit: Payer: PPO

## 2016-11-15 ENCOUNTER — Other Ambulatory Visit: Payer: Self-pay | Admitting: Family Medicine

## 2016-11-16 ENCOUNTER — Telehealth: Payer: Self-pay | Admitting: Family Medicine

## 2016-11-16 NOTE — Telephone Encounter (Signed)
Copied from CRM #[id number]. Topic: Inquiry >> Nov 16, 2016 11:33 AM Monica Madden L, NT wrote: Reason for CRM: pt has physical this Friday 11/2 she is out of her medicine as of today and she has contacted pharmacy yesterday and they still have not heard back. She will need her RX asap. 

## 2016-11-16 NOTE — Telephone Encounter (Signed)
Rx's filled

## 2016-11-16 NOTE — Telephone Encounter (Signed)
Copied from CRM [phone]. Topic: Inquiry >> Nov 16, 2016 11:33 AM Windy KalataMichael, Jorma Tassinari L, NT wrote: Reason for CRM: pt has physical this Friday 11/2 she is out of her medicine as of today and she has contacted pharmacy yesterday and they still have not heard back. She will need her RX asap.

## 2016-11-16 NOTE — Telephone Encounter (Signed)
meds filled

## 2016-11-16 NOTE — Telephone Encounter (Signed)
Copied from CRM #[id number]. Topic: Quick Communication - See Telephone Encounter >> Nov 16, 2016 11:40 AM Windy KalataMichael, Altariq Goodall L, NT wrote: CRM for notification. See Telephone encounter for: 11/16/16. pt has physical this Friday 11/2 she is out of her medicine as of today and she has contacted pharmacy yesterday and they still have not heard back. She will need her RX asap.  pt is unsure of what RX they need. She says the filled 3 of the 4

## 2016-11-19 ENCOUNTER — Encounter: Payer: Self-pay | Admitting: Family Medicine

## 2016-11-19 ENCOUNTER — Ambulatory Visit (INDEPENDENT_AMBULATORY_CARE_PROVIDER_SITE_OTHER): Payer: PPO | Admitting: Family Medicine

## 2016-11-19 VITALS — BP 116/74 | HR 73 | Temp 98.4°F | Ht 60.25 in | Wt 289.5 lb

## 2016-11-19 DIAGNOSIS — E7800 Pure hypercholesterolemia, unspecified: Secondary | ICD-10-CM

## 2016-11-19 DIAGNOSIS — Z0000 Encounter for general adult medical examination without abnormal findings: Secondary | ICD-10-CM | POA: Diagnosis not present

## 2016-11-19 DIAGNOSIS — E785 Hyperlipidemia, unspecified: Secondary | ICD-10-CM | POA: Diagnosis not present

## 2016-11-19 DIAGNOSIS — I10 Essential (primary) hypertension: Secondary | ICD-10-CM

## 2016-11-19 NOTE — Progress Notes (Signed)
Subjective:    Patient ID: Monica Madden, female    DOB: [birth year], 56 y.o.   MRN: [id number]  HPI Here for health maintenance exam and to review chronic medical problems    Tough week  P uncle with stage 4 lung cancer - may not make it  Also P aunt with heart problems and alz in hospice  Her father is having a tough time -and she is worried about him   amw was 3/18 Mini cog nl (17/20)  only because her vision limited resp to 3 step command  Mammogram 11/17 -nl  Has one planned next Wednesday  Self breast exam -no lump  Pap 8/16 neg with neg hpv -Dr Shawnie Pons  No vaginal bleeding at all   Colonoscopy 9/11 = nl bx per pt   Tetanus shot 10/16 Tdap   Had her flu shot for the season   Legally blind from retinitis pigmentosa   Wt Readings from Last 3 Encounters:  11/19/16 289 lb 8 oz (131.3 kg)  11/10/16 284 lb 12 oz (129.2 kg)  08/04/16 284 lb 8 oz (129 kg)  eating too much/stress eating and halloween candy  56.07 kg/m  Hyperlipidemia Lab Results  Component Value Date   CHOL 195 11/10/2016   CHOL 202 (H) 11/11/2015   CHOL 201 (H) 10/23/2014   Lab Results  Component Value Date   HDL 48.60 11/10/2016   HDL 37.10 (L) 11/11/2015   HDL 40.70 10/23/2014   Lab Results  Component Value Date   LDLCALC 124 (H) 11/10/2016   LDLCALC 126 (H) 10/23/2014   LDLCALC 118 (H) 08/02/2013   Lab Results  Component Value Date   TRIG 113.0 11/10/2016   TRIG 232.0 (H) 11/11/2015   TRIG 174.0 (H) 10/23/2014   Lab Results  Component Value Date   CHOLHDL 4 11/10/2016   CHOLHDL 5 11/11/2015   CHOLHDL 5 10/23/2014   Lab Results  Component Value Date   LDLDIRECT 139.0 11/11/2015   LDLDIRECT 147.0 07/01/2010   eating less carbs  More veg  Some avacados  Less cheese  Changed to vegetarian burgers -really likes them  Low fat yogurt instead of high fat    bp is stable today  No cp or palpitations or headaches or edema  No side effects to medicines  BP Readings from Last  3 Encounters:  11/19/16 116/74  11/10/16 114/72  08/04/16 128/70      Other labs:  Results for orders placed or performed in visit on 11/10/16  CBC with Differential/Platelet  Result Value Ref Range   WBC 9.5 4.0 - 10.5 K/uL   RBC 4.57 3.87 - 5.11 Mil/uL   Hemoglobin 13.5 12.0 - 15.0 g/dL   HCT 47.8 29.5 - 62.1 %   MCV 89.3 78.0 - 100.0 fl   MCHC 33.0 30.0 - 36.0 g/dL   RDW 30.8 65.7 - 84.6 %   Platelets 268.0 150.0 - 400.0 K/uL   Neutrophils Relative % 67.3 43.0 - 77.0 %   Lymphocytes Relative 21.7 12.0 - 46.0 %   Monocytes Relative 6.9 3.0 - 12.0 %   Eosinophils Relative 3.4 0.0 - 5.0 %   Basophils Relative 0.7 0.0 - 3.0 %   Neutro Abs 6.4 1.4 - 7.7 K/uL   Lymphs Abs 2.1 0.7 - 4.0 K/uL   Monocytes Absolute 0.7 0.1 - 1.0 K/uL   Eosinophils Absolute 0.3 0.0 - 0.7 K/uL   Basophils Absolute 0.1 0.0 - 0.1 K/uL  Comprehensive metabolic panel  Result Value Ref Range   Sodium 137 135 - 145 mEq/L   Potassium 5.0 3.5 - 5.1 mEq/L   Chloride 100 96 - 112 mEq/L   CO2 31 19 - 32 mEq/L   Glucose, Bld 95 70 - 99 mg/dL   BUN 21 6 - 23 mg/dL   Creatinine, Ser 1.61 0.40 - 1.20 mg/dL   Total Bilirubin 0.5 0.2 - 1.2 mg/dL   Alkaline Phosphatase 69 39 - 117 U/L   AST 13 0 - 37 U/L   ALT 16 0 - 35 U/L   Total Protein 7.1 6.0 - 8.3 g/dL   Albumin 4.2 3.5 - 5.2 g/dL   Calcium 9.7 8.4 - 09.6 mg/dL   GFR 04.54 >09.81 mL/min  Lipid panel  Result Value Ref Range   Cholesterol 195 0 - 200 mg/dL   Triglycerides 191.4 0.0 - 149.0 mg/dL   HDL 78.29 >56.21 mg/dL   VLDL 30.8 0.0 - 65.7 mg/dL   LDL Cholesterol 846 (H) 0 - 99 mg/dL   Total CHOL/HDL Ratio 4    NonHDL 146.34   TSH  Result Value Ref Range   TSH 1.36 0.35 - 4.50 uIU/mL     Patient Active Problem List   Diagnosis Date Noted  . Encounter for pain management 08/05/2016  . Chronic pain 11/17/2015  . Elevated glucose level 11/09/2015  . Need for hepatitis C screening test 10/20/2014  . Routine general medical examination at a  health care facility 06/30/2011  . Amenorrhea 06/30/2011  . Adverse effects of medication 07/01/2010  . Right shoulder pain 07/01/2010  . DYSPEPSIA 09/26/2009  . IRRITABLE BOWEL SYNDROME 08/07/2009  . KNEE PAIN, BILATERAL 08/07/2009  . CONSTIPATION 01/15/2009  . LEG PAIN, LEFT 10/09/2008  . Hyperlipidemia 05/09/2008  . Essential hypertension 03/29/2008  . 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 06/05/2007  . INCI HERNIA WITHOUT MENTION OBSTRUCTION/GANGRENE 05/12/2007  . FREQUENCY, URINARY 05/12/2007  . ARTHRALGIA 11/03/2006  . DEPENDENT EDEMA, LEGS 11/03/2006  . Morbid obesity (HCC) 07/15/2006  . ANXIETY DEPRESSION 07/15/2006  . MIGRAINE HEADACHE 07/15/2006  . RETINITIS PIGMENTOSA 07/15/2006  . VARICOSE VEINS, LOWER EXTREMITIES 07/15/2006  . ALLERGIC RHINITIS 07/15/2006  . ASTHMA 07/15/2006  . GERD 07/15/2006  . BACK PAIN, LUMBAR, CHRONIC 07/15/2006  . INCONTINENCE, URGE 07/15/2006   Past Medical History:  Diagnosis Date  . Allergy    allergic rhinitis  . Anemia   . Anxiety    ATTACKS  . Arthritis   . Asthma   . Cataract    left cataract removal 12/2008  . Chronic back pain    OA of spine  . Depression   . GERD (gastroesophageal reflux disease)    Endo negative 02/2000  . Hypertension 03/2008  . IBS (irritable bowel syndrome)   . MRSA infection 2008   Right leg  . Obesity   . Ovarian cyst   . Retinitis pigmentosa    blind   Past Surgical History:  Procedure Laterality Date  . APPENDECTOMY    . BLADDER SURGERY  1968   age 41 ? dilitation  . CESAREAN SECTION    . CHOLECYSTECTOMY    . CYSTOSCOPY  03/2008   negative// Dr. Wanda Plump urologist  . ENDOMETRIAL BIOPSY  07/1998   negative  . EYE SURGERY  12/2008   cataract removal left  . HERNIA REPAIR  05/2006   ruptured hernia repair after fall and then two more surgeries within as many weeks for complications(05/2006) Umbilical hernia repair (06/1997)  .  KNEE ARTHROSCOPY W/ MENISCAL REPAIR     left  knne   Social History   Tobacco Use  . Smoking status: Never Smoker  . Smokeless tobacco: Never Used  Substance Use Topics  . Alcohol use: Yes    Comment: twice a year  . Drug use: No   Family History  Problem Relation Age of Onset  . Depression Mother   . Heart disease Father   . Diabetes Maternal Uncle   . Hypertension Maternal Grandmother   . Depression Brother        Depression secondary to MVA injuries  . Hypertension Brother   . Heart disease Paternal Grandfather   . Heart disease Paternal Grandmother    Allergies  Allergen Reactions  . Diclofenac Sodium     REACTION: allergic/ made asthma bad  . Etodolac     REACTION: GI  . Metronidazole     REACTION: GI side eff  . Oxybutynin Itching  . Sulfamethoxazole-Trimethoprim   . Tolterodine Tartrate     REACTION: None Effective  . Tramadol Other (See Comments)    Caused elevated BP   Current Outpatient Medications on File Prior to Visit  Medication Sig Dispense Refill  . acyclovir ointment (ZOVIRAX) 5 % Apply to fever blister 5 times daily for 4 days. 15 g 0  . albuterol (VENTOLIN HFA) 108 (90 BASE) MCG/ACT inhaler As needed for asthma attack     . ALPRAZolam (XANAX) 1 MG tablet TAKE ONE TABLET 3 TIMES A DAY AS NEEDED. 90 tablet 3  . Ascorbic Acid (VITAMIN C) 1000 MG tablet Take 1,000 mg by mouth daily.      . Calcium Carbonate (CALCIUM 600) 1500 MG TABS Take as directed.     . Coenzyme Q10 (CO Q 10 PO) Take 1 tablet by mouth daily.    . fexofenadine (ALLER-EASE) 180 MG tablet Take 1 tablet (180 mg total) by mouth daily. 90 tablet 3  . fluticasone (FLONASE) 50 MCG/ACT nasal spray Place 2 sprays into both nostrils 2 (two) times daily as needed for allergies or rhinitis. 2 puffs in each nostril daily 48 g 3  . HYDROcodone-acetaminophen (NORCO/VICODIN) 5-325 MG tablet Take 1 tablet by mouth every 6 (six) hours as needed. Do not fill until 01/10/17 90 tablet 0  . lisinopril (PRINIVIL,ZESTRIL) 5 MG tablet TAKE ONE TABLET  BY MOUTH EVERY MORNING 90 tablet 0  . montelukast (SINGULAIR) 10 MG tablet TAKE ONE (1) TABLET EACH DAY 90 tablet 0  . Omega 3-6-9 Fatty Acids (OMEGA 3-6-9 COMPLEX PO) Take one tablet by mouth daily    . omeprazole (PRILOSEC) 40 MG capsule TAKE ONE (1) CAPSULE EACH DAY 90 capsule 0  . sertraline (ZOLOFT) 50 MG tablet TAKE ONE (1) TABLET EACH DAY 90 tablet 0  . tolterodine (DETROL LA) 4 MG 24 hr capsule Take 1 capsule (4 mg total) by mouth daily. (Patient taking differently: Take 4 mg by mouth daily as needed. ) 90 capsule 3   No current facility-administered medications on file prior to visit.     Review of Systems  Constitutional: Negative for activity change, appetite change, fatigue, fever and unexpected weight change.  HENT: Negative for congestion, ear pain, rhinorrhea, sinus pressure and sore throat.   Eyes: Positive for visual disturbance. Negative for pain and redness.  Respiratory: Negative for cough, shortness of breath and wheezing.   Cardiovascular: Negative for chest pain and palpitations.  Gastrointestinal: Negative for abdominal pain, blood in stool, constipation and diarrhea.  Endocrine: Negative  for polydipsia and polyuria.  Genitourinary: Negative for dysuria, frequency and urgency.  Musculoskeletal: Positive for arthralgias. Negative for back pain and myalgias.  Skin: Negative for pallor and rash.  Allergic/Immunologic: Negative for environmental allergies.  Neurological: Negative for dizziness, syncope and headaches.  Hematological: Negative for adenopathy. Does not bruise/bleed easily.  Psychiatric/Behavioral: Negative for decreased concentration and dysphoric mood. The patient is not nervous/anxious.        Objective:   Physical Exam  Constitutional: She appears well-developed and well-nourished. No distress.  Morbidly obese and well appearing     HENT:  Head: Normocephalic and atraumatic.  Right Ear: External ear normal.  Left Ear: External ear normal.    Mouth/Throat: Oropharynx is clear and moist.  Pt is blind  Eyes: Conjunctivae and EOM are normal. Pupils are equal, round, and reactive to light. No scleral icterus.  Neck: Normal range of motion. Neck supple. No JVD present. Carotid bruit is not present. No thyromegaly present.  Cardiovascular: Normal rate, regular rhythm, normal heart sounds and intact distal pulses. Exam reveals no gallop.  Pulmonary/Chest: Effort normal and breath sounds normal. No respiratory distress. She has no wheezes. She exhibits no tenderness.  Abdominal: Soft. Bowel sounds are normal. She exhibits no distension, no abdominal bruit and no mass. There is no tenderness.  Genitourinary: No breast swelling, tenderness, discharge or bleeding.  Genitourinary Comments: Breast exam: No mass, nodules, thickening, tenderness, bulging, retraction, inflamation, nipple discharge or skin changes noted.  No axillary or clavicular LA.      Musculoskeletal: She exhibits no edema or tenderness.  Limited rom of knees bilat   Lymphadenopathy:    She has no cervical adenopathy.  Neurological: She is alert. She has normal reflexes. No cranial nerve deficit. She exhibits normal muscle tone. Coordination normal.  Skin: Skin is warm and dry. No rash noted. No erythema. No pallor.  Solar lentigines diffusely   Psychiatric: She has a normal mood and affect.  pleasant          Assessment & Plan:   Problem List Items Addressed This Visit      Cardiovascular and Mediastinum   Essential hypertension - Primary    bp in fair control at this time  BP Readings from Last 1 Encounters:  11/19/16 116/74   No changes needed Disc lifstyle change with low sodium diet and exercise  Labs reviewed  Wt loss enc        Other   Hyperlipidemia    Disc goals for lipids and reasons to control them Rev labs with pt Rev low sat fat diet in detail  Stable with improved triglycerides and HDL Better diet      Morbid obesity (HCC)     Discussed how this problem influences overall health and the risks it imposes  Reviewed plan for weight loss with lower calorie diet (via better food choices and also portion control or program like weight watchers) and exercise building up to or more than 30 minutes 5 days per week including some aerobic activity         Routine general medical examination at a health care facility    Reviewed health habits including diet and exercise and skin cancer prevention Reviewed appropriate screening tests for age  Also reviewed health mt list, fam hx and immunization status , as well as social and family history   See HPI Labs reviewed  Wt loss enc  Good support for her vision loss  Mood is stable

## 2016-11-19 NOTE — Patient Instructions (Signed)
Keep working on [employer] diet  Cholesterol is improved   Try to get most of your carbohydrates from produce (with the exception of white potatoes)  Eat less bread/pasta/rice/snack foods/cereals/sweets and other items from the middle of the grocery store (processed carbs)  Take care of yourself   Try to stay as active as you can be

## 2016-11-21 NOTE — Assessment & Plan Note (Signed)
Reviewed health habits including diet and exercise and skin cancer prevention Reviewed appropriate screening tests for age  Also reviewed health mt list, fam hx and immunization status , as well as social and family history   See HPI Labs reviewed  Wt loss enc  Good support for her vision loss  Mood is stable

## 2016-11-21 NOTE — Assessment & Plan Note (Signed)
bp in fair control at this time  BP Readings from Last 1 Encounters:  11/19/16 116/74   No changes needed Disc lifstyle change with low sodium diet and exercise  Labs reviewed  Wt loss enc

## 2016-11-21 NOTE — Assessment & Plan Note (Signed)
Disc goals for lipids and reasons to control them Rev labs with pt Rev low sat fat diet in detail  Stable with improved triglycerides and HDL Better diet

## 2016-11-21 NOTE — Assessment & Plan Note (Signed)
Discussed how this problem influences overall health and the risks it imposes  Reviewed plan for weight loss with lower calorie diet (via better food choices and also portion control or program like weight watchers) and exercise building up to or more than 30 minutes 5 days per week including some aerobic activity    

## 2016-11-24 ENCOUNTER — Ambulatory Visit
Admission: RE | Admit: 2016-11-24 | Discharge: 2016-11-24 | Disposition: A | Discharge status: Home / Self Care | Payer: PPO | Source: Ambulatory Visit | Attending: Family Medicine | Admitting: Family Medicine

## 2016-11-24 DIAGNOSIS — Z1231 Encounter for screening mammogram for malignant neoplasm of breast: Secondary | ICD-10-CM | POA: Diagnosis not present

## 2016-12-17 ENCOUNTER — Other Ambulatory Visit: Payer: Self-pay | Admitting: Family Medicine

## 2016-12-17 DIAGNOSIS — M9901 Segmental and somatic dysfunction of cervical region: Secondary | ICD-10-CM | POA: Diagnosis not present

## 2016-12-17 DIAGNOSIS — M5386 Other specified dorsopathies, lumbar region: Secondary | ICD-10-CM | POA: Diagnosis not present

## 2016-12-17 DIAGNOSIS — M530 Cervicocranial syndrome: Secondary | ICD-10-CM | POA: Diagnosis not present

## 2016-12-17 DIAGNOSIS — M9903 Segmental and somatic dysfunction of lumbar region: Secondary | ICD-10-CM | POA: Diagnosis not present

## 2016-12-17 NOTE — Telephone Encounter (Signed)
Px written for call in   

## 2016-12-17 NOTE — Telephone Encounter (Signed)
Pt has Stop act appt scheduled on 02/07/17, last filled on 08/17/16 #90 tabs with 3 additional refills, please advise

## 2016-12-17 NOTE — Telephone Encounter (Signed)
Rx called in as prescribed 

## 2017-01-16 ENCOUNTER — Other Ambulatory Visit: Payer: Self-pay | Admitting: Family Medicine

## 2017-02-07 ENCOUNTER — Ambulatory Visit (INDEPENDENT_AMBULATORY_CARE_PROVIDER_SITE_OTHER): Payer: PPO | Admitting: Family Medicine

## 2017-02-07 ENCOUNTER — Encounter: Payer: Self-pay | Admitting: Family Medicine

## 2017-02-07 ENCOUNTER — Encounter: Payer: Self-pay | Admitting: Radiology

## 2017-02-07 VITALS — BP 128/78 | HR 70 | Temp 97.7°F | Ht 60.25 in | Wt 292.5 lb

## 2017-02-07 DIAGNOSIS — G8929 Other chronic pain: Secondary | ICD-10-CM

## 2017-02-07 DIAGNOSIS — M170 Bilateral primary osteoarthritis of knee: Secondary | ICD-10-CM | POA: Diagnosis not present

## 2017-02-07 MED ORDER — HYDROCODONE-ACETAMINOPHEN 5-325 MG PO TABS: 1.0000 | ORAL_TABLET | Freq: Four times a day (QID) | ORAL | 0 refills | Status: DC | PRN

## 2017-02-07 NOTE — Assessment & Plan Note (Addendum)
Stable chronic pain in knees and other joints due to OA and L meniscal tear  Rev last ortho note  She is contemplating L knee repl in the future  Aware that weight loss would help Rev [facility], pain contract  UDS today  Refilled 90 norco - 3 separate px for 3 mo post dated to fill after 02/10/17, 03/13/17, and 04/10/17 Disc habit and sedation potential as well as constipation  F/u in 3 mo for next chronic pain management visit

## 2017-02-07 NOTE — Progress Notes (Signed)
Subjective:    Patient ID: Monica Madden, female    DOB: [birth year], 57 y.o.   MRN: [id number]  HPI Here for chronic pain medication encounter    Indication for chronic opioid: chronic pain in both knees from OA and meniscal tears (worse on L)  Prev tx with cortisone injections  Lubricant injections are not affordable  Thinking about surgery on the L knee  Pt notes she has OA all over- and hurts all over all the time  Medication and dose: norco 5-325 mg 1 tablet every 6 hours prn # pills per month: 90 Last UDS date: 02/27/16 low risk  (of note THC is for her retinitis pigmentosa) She also takes xanax  Pain contract signed (date): 02/26/16 Date narcotic database last reviewed (include red flags): today-no red flags   Last refill of pain medication was 01/12/18   (90 pills) This am she had 16 pills left in the bottle  Takes 2-3 pills per day (usually 3)   Morbid obesity Wt Readings from Last 3 Encounters:  02/07/17 292 lb 8 oz (132.7 kg)  11/19/16 289 lb 8 oz (131.3 kg)  11/10/16 284 lb 12 oz (129.2 kg)   56.65 kg/m   Pain Inventory (1-10 worse): Average Pain with medicine 2 - when due for medicine 6-8 Pain Right Now -2 (just had shots) My pain is sharp/achey  (character i.e. sharp, stabbing, dull, constant etc)  Pain is worse with: standing/exertion  Relief from Meds: fairly helpful   In the last 24 hours, has pain interfered with the following (1-10 greatest interference) ? General activity - at least 5  Relation with others -not  Enjoyment of life -significant  What TIME of day is your pain at its worst? When she first wakes up      Sleep (in general) - ok once she gets in the chair   Mobility/Function: Assistance device: - cane  How many minutes can you walk?   20-30 with assistance Ability to climb steps?  only 1-2 with rail  Do you drive? no -because of vision  Disabled (date): total for vision as well (blindness)  Neuro/Psych Sx: (bladder, bowel, weakness,  dizziness, depression etc) None  Prior Studies: XR and MRI with orthopedic Has weight bearing xrays of both knees planned for her next ortho appt  Physicians involved in your care: Any changes since last visit?  [facility] Dr Rosita Kea  Seen today for cortisone injections   Patient Active Problem List   Diagnosis Date Noted  . Encounter for chronic pain management 02/07/2017  . Encounter for pain management 08/05/2016  . Chronic pain 11/17/2015  . Elevated glucose level 11/09/2015  . Need for hepatitis C screening test 10/20/2014  . Routine general medical examination at a health care facility 06/30/2011  . Amenorrhea 06/30/2011  . Adverse effects of medication 07/01/2010  . Right shoulder pain 07/01/2010  . DYSPEPSIA 09/26/2009  . IRRITABLE BOWEL SYNDROME 08/07/2009  . KNEE PAIN, BILATERAL 08/07/2009  . CONSTIPATION 01/15/2009  . LEG PAIN, LEFT 10/09/2008  . Hyperlipidemia 05/09/2008  . Essential hypertension 03/29/2008  . 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 06/05/2007  . INCI HERNIA WITHOUT MENTION OBSTRUCTION/GANGRENE 05/12/2007  . FREQUENCY, URINARY 05/12/2007  . ARTHRALGIA 11/03/2006  . DEPENDENT EDEMA, LEGS 11/03/2006  . Morbid obesity (HCC) 07/15/2006  . ANXIETY DEPRESSION 07/15/2006  . MIGRAINE HEADACHE 07/15/2006  . RETINITIS PIGMENTOSA 07/15/2006  . VARICOSE VEINS, LOWER EXTREMITIES 07/15/2006  . ALLERGIC RHINITIS 07/15/2006  . ASTHMA 07/15/2006  .  GERD 07/15/2006  . BACK PAIN, LUMBAR, CHRONIC 07/15/2006  . INCONTINENCE, URGE 07/15/2006   Past Medical History:  Diagnosis Date  . Allergy    allergic rhinitis  . Anemia   . Anxiety    ATTACKS  . Arthritis   . Asthma   . Cataract    left cataract removal 12/2008  . Chronic back pain    OA of spine  . Depression   . GERD (gastroesophageal reflux disease)    Endo negative 02/2000  . Hypertension 03/2008  . IBS (irritable bowel syndrome)   . MRSA infection 2008   Right leg  . Obesity   .  Ovarian cyst   . Retinitis pigmentosa    blind   Past Surgical History:  Procedure Laterality Date  . APPENDECTOMY    . BLADDER SURGERY  1968   age 87 ? dilitation  . CATARACT EXTRACTION W/PHACO Right 11/11/2014   Procedure: CATARACT EXTRACTION PHACO AND INTRAOCULAR LENS PLACEMENT (IOC);  Surgeon: Sallee LangeSteven Dingeldein, MD;  Location: [facility] ORS;  Service: Ophthalmology;  Laterality: Right;  LOT PACK: [id number] H US: 00:29.7 AP: 11.2 CDE: 7.14  . CESAREAN SECTION    . CHOLECYSTECTOMY    . CYSTOSCOPY  03/2008   negative// Dr. Wanda PlumpHumphries urologist  . ENDOMETRIAL BIOPSY  07/1998   negative  . EYE SURGERY  12/2008   cataract removal left  . HERNIA REPAIR  05/2006   ruptured hernia repair after fall and then two more surgeries within as many weeks for complications(05/2006) Umbilical hernia repair (06/1997)  . KNEE ARTHROSCOPY W/ MENISCAL REPAIR     left knne   Social History   Tobacco Use  . Smoking status: Never Smoker  . Smokeless tobacco: Never Used  Substance Use Topics  . Alcohol use: Yes    Comment: twice a year  . Drug use: No   Family History  Problem Relation Age of Onset  . Depression Mother   . Heart disease Father   . Diabetes Maternal Uncle   . Hypertension Maternal Grandmother   . Depression Brother        Depression secondary to MVA injuries  . Hypertension Brother   . Heart disease Paternal Grandfather   . Heart disease Paternal Grandmother    Allergies  Allergen Reactions  . Diclofenac Sodium     REACTION: allergic/ made asthma bad  . Etodolac     REACTION: GI  . Metronidazole     REACTION: GI side eff  . Oxybutynin Itching  . Sulfamethoxazole-Trimethoprim   . Tolterodine Tartrate     REACTION: None Effective  . Tramadol Other (See Comments)    Caused elevated BP   Current Outpatient Medications on File Prior to Visit  Medication Sig Dispense Refill  . acyclovir ointment (ZOVIRAX) 5 % Apply to fever blister 5 times daily for 4 days. 15 g 0  .  albuterol (VENTOLIN HFA) 108 (90 BASE) MCG/ACT inhaler As needed for asthma attack     . ALLERGY RELIEF 180 MG tablet TAKE ONE (1) TABLET BY MOUTH EVERY DAY 90 tablet 1  . ALPRAZolam (XANAX) 1 MG tablet TAKE ONE TABLET BY MOUTH 3 TIMES DAILY AS NEEDED 90 tablet 3  . Ascorbic Acid (VITAMIN C) 1000 MG tablet Take 1,000 mg by mouth daily.      . Calcium Carbonate (CALCIUM 600) 1500 MG TABS Take as directed.     . Coenzyme Q10 (CO Q 10 PO) Take 1 tablet by mouth daily.    . fluticasone (  FLONASE) 50 MCG/ACT nasal spray Place 2 sprays into both nostrils 2 (two) times daily as needed for allergies or rhinitis. 2 puffs in each nostril daily 48 g 3  . lisinopril (PRINIVIL,ZESTRIL) 5 MG tablet TAKE ONE TABLET BY MOUTH EVERY MORNING 90 tablet 0  . montelukast (SINGULAIR) 10 MG tablet TAKE ONE (1) TABLET EACH DAY 90 tablet 0  . Omega 3-6-9 Fatty Acids (OMEGA 3-6-9 COMPLEX PO) Take one tablet by mouth daily    . omeprazole (PRILOSEC) 40 MG capsule TAKE ONE (1) CAPSULE EACH DAY 90 capsule 0  . sertraline (ZOLOFT) 50 MG tablet TAKE ONE (1) TABLET EACH DAY 90 tablet 0  . tolterodine (DETROL LA) 4 MG 24 hr capsule TAKE ONE CAPSULE BY MOUTH DAILY 90 capsule 1   No current facility-administered medications on file prior to visit.        Review of Systems  Constitutional: Negative for activity change, appetite change, fatigue, fever and unexpected weight change.  HENT: Negative for congestion, ear pain, rhinorrhea, sinus pressure and sore throat.   Eyes: Positive for visual disturbance. Negative for pain and redness.  Respiratory: Negative for cough, shortness of breath and wheezing.   Cardiovascular: Negative for chest pain and palpitations.  Gastrointestinal: Negative for abdominal pain, blood in stool, constipation and diarrhea.  Endocrine: Negative for polydipsia and polyuria.  Genitourinary: Negative for dysuria, frequency and urgency.  Musculoskeletal: Positive for arthralgias, back pain and gait  problem.  Skin: Negative for pallor and rash.  Allergic/Immunologic: Negative for environmental allergies.  Neurological: Negative for dizziness, syncope and headaches.  Hematological: Negative for adenopathy. Does not bruise/bleed easily.  Psychiatric/Behavioral: Negative for decreased concentration and dysphoric mood. The patient is nervous/anxious.        Objective:   Physical Exam  Constitutional: She appears well-developed and well-nourished. No distress.  Morbidly obese and well appearing   HENT:  Head: Normocephalic and atraumatic.  Mouth/Throat: Oropharynx is clear and moist.  Eyes: Conjunctivae and EOM are normal. Pupils are equal, round, and reactive to light.  Baseline poor vision  Musculoskeletal:  Labored gait due to severe knee pain and back pain   No acute joint swelling    Neurological: She is alert. No cranial nerve deficit. She exhibits normal muscle tone. Coordination normal.  Skin: Skin is warm and dry. No rash noted. No erythema.  Psychiatric: She has a normal mood and affect.          Assessment & Plan:   Problem List Items Addressed This Visit      Other   Encounter for chronic pain management - Primary    Stable chronic pain in knees and other joints due to OA and L meniscal tear  Rev last ortho note  She is contemplating L knee repl in the future  Aware that weight loss would help Rev [facility], pain contract  UDS today  Refilled 90 norco - 3 separate px for 3 mo post dated to fill after 02/10/17, 03/13/17, and 04/10/17 Disc habit and sedation potential as well as constipation  F/u in 3 mo for next chronic pain management visit       Relevant Orders   Pain Mgmt, Profile 8 w/Conf, U   Morbid obesity (HCC)    Discussed how this problem influences overall health and the risks it imposes  Reviewed plan for weight loss with lower calorie diet (via better food choices and also portion control or program like weight watchers) and exercise building up to  or more  than 30 minutes 5 days per week including some aerobic activity   Pt is legally blind-with chronic knee pain so exercise is limited She is trying to do better with diet

## 2017-02-07 NOTE — Patient Instructions (Signed)
Take care of yourself   Follow up in 3 months for next pain management visit

## 2017-02-07 NOTE — Assessment & Plan Note (Signed)
Discussed how this problem influences overall health and the risks it imposes  Reviewed plan for weight loss with lower calorie diet (via better food choices and also portion control or program like weight watchers) and exercise building up to or more than 30 minutes 5 days per week including some aerobic activity   Pt is legally blind-with chronic knee pain so exercise is limited She is trying to do better with diet

## 2017-02-10 ENCOUNTER — Encounter: Payer: Self-pay | Admitting: Family Medicine

## 2017-02-10 ENCOUNTER — Telehealth: Payer: Self-pay | Admitting: Family Medicine

## 2017-02-10 NOTE — Telephone Encounter (Signed)
Copied from CRM #[id number]. Topic: Quick Communication - See Telephone Encounter >> Feb 10, 2017  2:34 PM Arlyss Gandyichardson, Fredonia Casalino N, NT wrote: CRM for notification. See Telephone encounter for: [facility] pharmacy is getting an error from pts insurance when trying to fill the  HYDROcodone-acetaminophen (NORCO/VICODIN). States pt is also on benzodiazepine as well and need to know if this is ok to take with the hydrocodone. CB#: [phone]  02/10/17.

## 2017-02-10 NOTE — Telephone Encounter (Signed)
[facility] pharmacy is getting an error from pts insurance when trying to fill the  HYDROcodone-acetaminophen (NORCO/VICODIN). States pt is also on benzodiazepine as well and need to know if this is ok to take with the hydrocodone. CB#: [phone]

## 2017-02-10 NOTE — Telephone Encounter (Signed)
Yes- and I am aware/ she has been taking both chronically Thanks

## 2017-02-11 LAB — PAIN MGMT, PROFILE 8 W/CONF, U
6 Acetylmorphine: NEGATIVE ng/mL (ref <10)
ALPHAHYDROXYMIDAZOLAM: NEGATIVE ng/mL (ref <50)
ALPHAHYDROXYTRIAZOLAM: NEGATIVE ng/mL (ref <50)
Alcohol Metabolites: NEGATIVE ng/mL (ref <500)
Alphahydroxyalprazolam: 398 ng/mL — ABNORMAL HIGH (ref <25)
Aminoclonazepam: NEGATIVE ng/mL (ref <25)
Amphetamines: NEGATIVE ng/mL (ref <500)
Benzodiazepines: POSITIVE ng/mL — AB (ref <100)
Buprenorphine, Urine: NEGATIVE ng/mL (ref <5)
COCAINE METABOLITE: NEGATIVE ng/mL (ref <150)
Codeine: NEGATIVE ng/mL (ref <50)
Creatinine: 96.5 mg/dL
HYDROMORPHONE: 229 ng/mL — AB (ref <50)
Hydrocodone: 1044 ng/mL — ABNORMAL HIGH (ref <50)
Hydroxyethylflurazepam: NEGATIVE ng/mL (ref <50)
LORAZEPAM: NEGATIVE ng/mL (ref <50)
MARIJUANA METABOLITE: 27 ng/mL — AB (ref <5)
MDMA: NEGATIVE ng/mL (ref <500)
MORPHINE: NEGATIVE ng/mL (ref <50)
Marijuana Metabolite: POSITIVE ng/mL — AB (ref <20)
NORDIAZEPAM: NEGATIVE ng/mL (ref <50)
NORHYDROCODONE: 979 ng/mL — AB (ref <50)
OXAZEPAM: NEGATIVE ng/mL (ref <50)
Opiates: POSITIVE ng/mL — AB (ref <100)
Oxidant: NEGATIVE ug/mL (ref <200)
Oxycodone: NEGATIVE ng/mL (ref <100)
Temazepam: NEGATIVE ng/mL (ref <50)
pH: 6.77 (ref 4.5–9.0)

## 2017-02-11 NOTE — Telephone Encounter (Signed)
Left VM letting pharmacy know it's okay for pt to be on both meds

## 2017-02-14 ENCOUNTER — Encounter: Payer: Self-pay | Admitting: Family Medicine

## 2017-02-14 MED ORDER — OMEPRAZOLE 40 MG PO CPDR: DELAYED_RELEASE_CAPSULE | ORAL | 1 refills | Status: DC

## 2017-02-14 MED ORDER — FLUTICASONE PROPIONATE 50 MCG/ACT NA SUSP: 2.0000 | Freq: Two times a day (BID) | NASAL | 1 refills | Status: DC | PRN

## 2017-02-14 MED ORDER — MONTELUKAST SODIUM 10 MG PO TABS: ORAL_TABLET | ORAL | 1 refills | Status: DC

## 2017-02-14 MED ORDER — SERTRALINE HCL 50 MG PO TABS: 50.0000 mg | ORAL_TABLET | Freq: Every day | ORAL | 1 refills | Status: DC

## 2017-02-14 MED ORDER — LISINOPRIL 5 MG PO TABS: 5.0000 mg | ORAL_TABLET | Freq: Every morning | ORAL | 1 refills | Status: DC

## 2017-04-15 ENCOUNTER — Other Ambulatory Visit: Payer: Self-pay | Admitting: Family Medicine

## 2017-04-15 MED ORDER — ALPRAZOLAM 1 MG PO TABS: ORAL_TABLET | ORAL | 3 refills | Status: DC

## 2017-04-15 NOTE — Telephone Encounter (Signed)
Will refill electronically  

## 2017-04-15 NOTE — Telephone Encounter (Signed)
Refill request for alprazolam.  LOV  02/07/17  NOV 05/04/17  Provider: Darrol JumpMarne Towe, MD [facility] [address], [place].  Please review.

## 2017-04-15 NOTE — Telephone Encounter (Signed)
Copied from CRM [phone]. Topic: Quick Communication - Rx Refill/Question >> Apr 15, 2017 12:39 PM Paul Torpey, Tresa EndoKelly, [place] wrote: Medication: Alprazolam 1 mg Has the patient contacted their pharmacy? No Patient needs a refill on the above medication.Stated that she is going blind and need help placing her refill request Preferred Pharm[facility] (with phone number or street name[facility] [place] [address] [phone] Agent: Please be advised that RX refills may take up to 3 business days. We ask that you follow-up with your pharmacy.

## 2017-04-25 DIAGNOSIS — H3552 Pigmentary retinal dystrophy: Secondary | ICD-10-CM | POA: Diagnosis not present

## 2017-05-04 ENCOUNTER — Ambulatory Visit (INDEPENDENT_AMBULATORY_CARE_PROVIDER_SITE_OTHER): Payer: PPO | Admitting: Family Medicine

## 2017-05-04 ENCOUNTER — Encounter: Payer: Self-pay | Admitting: Family Medicine

## 2017-05-04 VITALS — BP 116/64 | HR 75 | Temp 98.6°F | Ht 60.25 in | Wt 293.5 lb

## 2017-05-04 DIAGNOSIS — G8929 Other chronic pain: Secondary | ICD-10-CM | POA: Diagnosis not present

## 2017-05-04 MED ORDER — HYDROCODONE-ACETAMINOPHEN 5-325 MG PO TABS: 1.0000 | ORAL_TABLET | Freq: Four times a day (QID) | ORAL | 0 refills | Status: DC | PRN

## 2017-05-04 NOTE — Patient Instructions (Signed)
Take care of yourself  No change in treatment   Next pain management visit in 3 months please

## 2017-05-04 NOTE — Progress Notes (Signed)
Subjective:    Patient ID: Monica Madden, female    DOB: Jan 07, 1961, 57 y.o.   MRN: [id number]  HPI  Reviewed and updated  Today 04/1717  Indication for chronic opioid: chronic pain bilat knees from OA and meniscal injury (worse on the L)  Prev tx with cortisone injections (lubricant injections are not affordable)    Poss surgery in the future  Medication and dose: norco 5-325 mg 1 tab every 6 hours prn pain #90 pills per mo  # pills per month: 90 Last UDS date: very low risk (does use THC fro retinitis pigmentosa) done 02/07/17 Pain contract signed (date): 02/27/16 Date narcotic database last reviewed (include red flags): today, no red flags Aware she also takes xanax for anxiety   Last refill was 04/10/17 (90 pills)   Pain Inventory (1-10 worse): Average Pain with medicine  2 , when due 6-8 out of 10 Pain Right Now  My pain is  Sharp/sometimes achey Pain is worse with: exertion an dstanding Relief from Meds: fair   In the last 24 hours, has pain interfered with the following (1-10 greatest interference) ? General activity 5 or more  Relation with others one  Enjoyment of life - significant What TIME of day is your pain at its worst? Early am    (also now with weather change and coolness or with air conditioning blowing on it)  Sleep (in general) fair   Mobility/Function: Assistance device: cane How many minutes can you walk? 20-30 with assistance Ability to climb steps?  2 or less with rail/assist Do you drive? no (blind as well) Disabled (date): total (also for vision) Neuro/Psych Sx: (bladder, bowel, weakness, dizziness, depression etc) none Prior Studies: Past xr/mri/ortho  Physicians involved in your care: Any changes since last visit?  [facility]  Last xrays in jan- determined L knee needs replacement   Will be set up for 3 month f/u injection soon    Stressed - Her husband lost job recently (after 21 years) He has medical problems anyway and may  need to file for disability   In a funk    Patient Active Problem List   Diagnosis Date Noted  . Encounter for chronic pain management 02/07/2017  . Encounter for pain management 08/05/2016  . Chronic pain 11/17/2015  . Elevated glucose level 11/09/2015  . Need for hepatitis C screening test 10/20/2014  . Routine general medical examination at a health care facility 06/30/2011  . Amenorrhea 06/30/2011  . Adverse effects of medication 07/01/2010  . DYSPEPSIA 09/26/2009  . IRRITABLE BOWEL SYNDROME 08/07/2009  . KNEE PAIN, BILATERAL 08/07/2009  . CONSTIPATION 01/15/2009  . LEG PAIN, LEFT 10/09/2008  . Hyperlipidemia 05/09/2008  . Essential hypertension 03/29/2008  . 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 06/05/2007  . INCI HERNIA WITHOUT MENTION OBSTRUCTION/GANGRENE 05/12/2007  . FREQUENCY, URINARY 05/12/2007  . ARTHRALGIA 11/03/2006  . DEPENDENT EDEMA, LEGS 11/03/2006  . Morbid obesity (HCC) 07/15/2006  . ANXIETY DEPRESSION 07/15/2006  . MIGRAINE HEADACHE 07/15/2006  . RETINITIS PIGMENTOSA 07/15/2006  . VARICOSE VEINS, LOWER EXTREMITIES 07/15/2006  . ALLERGIC RHINITIS 07/15/2006  . ASTHMA 07/15/2006  . GERD 07/15/2006  . BACK PAIN, LUMBAR, CHRONIC 07/15/2006  . INCONTINENCE, URGE 07/15/2006   Past Medical History:  Diagnosis Date  . Allergy    allergic rhinitis  . Anemia   . Anxiety    ATTACKS  . Arthritis   . Asthma   . Cataract    left cataract removal  12/2008  . Chronic back pain    OA of spine  . Depression   . GERD (gastroesophageal reflux disease)    Endo negative 02/2000  . Hypertension 03/2008  . IBS (irritable bowel syndrome)   . MRSA infection 2008   Right leg  . Obesity   . Ovarian cyst   . Retinitis pigmentosa    blind   Past Surgical History:  Procedure Laterality Date  . APPENDECTOMY    . BLADDER SURGERY  1968   age 39 ? dilitation  . CATARACT EXTRACTION W/PHACO Right 11/11/2014   Procedure: CATARACT EXTRACTION PHACO AND  INTRAOCULAR LENS PLACEMENT (IOC);  Surgeon: Sallee Lange, MD;  Location: [facility] ORS;  Service: Ophthalmology;  Laterality: Right;  LOT PACK: [id number] H [place]: 00:29.7 AP: 11.2 CDE: 7.14  . CESAREAN SECTION    . CHOLECYSTECTOMY    . CYSTOSCOPY  03/2008   negative// Dr. Wanda Plump urologist  . ENDOMETRIAL BIOPSY  07/1998   negative  . EYE SURGERY  12/2008   cataract removal left  . HERNIA REPAIR  05/2006   ruptured hernia repair after fall and then two more surgeries within as many weeks for complications(05/2006) Umbilical hernia repair (06/1997)  . KNEE ARTHROSCOPY W/ MENISCAL REPAIR     left knne   Social History   Tobacco Use  . Smoking status: Never Smoker  . Smokeless tobacco: Never Used  Substance Use Topics  . Alcohol use: Yes    Comment: twice a year  . Drug use: No   Family History  Problem Relation Age of Onset  . Depression Mother   . Heart disease Father   . Diabetes Maternal Uncle   . Hypertension Maternal Grandmother   . Depression Brother        Depression secondary to MVA injuries  . Hypertension Brother   . Heart disease Paternal Grandfather   . Heart disease Paternal Grandmother    Allergies  Allergen Reactions  . Diclofenac Sodium     REACTION: allergic/ made asthma bad  . Etodolac     REACTION: GI  . Metronidazole     REACTION: GI side eff  . Nsaids Other (See Comments)    GI Upset  . Oxybutynin Itching  . Sulfamethoxazole-Trimethoprim   . Tolterodine Tartrate     REACTION: None Effective  . Tramadol Other (See Comments)    Caused elevated BP  . Sulfa Antibiotics Itching and Rash   Current Outpatient Medications on File Prior to Visit  Medication Sig Dispense Refill  . acyclovir ointment (ZOVIRAX) 5 % Apply to fever blister 5 times daily for 4 days. 15 g 0  . albuterol (VENTOLIN HFA) 108 (90 BASE) MCG/ACT inhaler As needed for asthma attack     . ALLERGY RELIEF 180 MG tablet TAKE ONE (1) TABLET BY MOUTH EVERY DAY 90 tablet 1  . ALPRAZolam  (XANAX) 1 MG tablet TAKE ONE TABLET BY MOUTH 3 TIMES DAILY AS NEEDED 90 tablet 3  . Ascorbic Acid (VITAMIN C) 1000 MG tablet Take 1,000 mg by mouth daily.      . Calcium Carbonate (CALCIUM 600) 1500 MG TABS Take as directed.     . Coenzyme Q10 (CO Q 10 PO) Take 1 tablet by mouth daily.    . fluticasone (FLONASE) 50 MCG/ACT nasal spray Place 2 sprays into both nostrils 2 (two) times daily as needed for allergies or rhinitis. 2 puffs in each nostril daily 48 g 1  . lisinopril (PRINIVIL,ZESTRIL) 5 MG tablet Take 1 tablet (  5 mg total) by mouth every morning. 90 tablet 1  . montelukast (SINGULAIR) 10 MG tablet TAKE ONE (1) TABLET EACH DAY 90 tablet 1  . Omega 3-6-9 Fatty Acids (OMEGA 3-6-9 COMPLEX PO) Take one tablet by mouth daily    . omeprazole (PRILOSEC) 40 MG capsule TAKE ONE (1) CAPSULE EACH DAY 90 capsule 1  . sertraline (ZOLOFT) 50 MG tablet Take 1 tablet (50 mg total) by mouth daily. 90 tablet 1  . tolterodine (DETROL LA) 4 MG 24 hr capsule TAKE ONE CAPSULE BY MOUTH DAILY 90 capsule 1   No current facility-administered medications on file prior to visit.      Review of Systems  Constitutional: Negative for activity change, appetite change, fatigue, fever and unexpected weight change.  HENT: Negative for congestion, ear pain, rhinorrhea, sinus pressure and sore throat.   Eyes: Negative for pain, redness and visual disturbance.  Respiratory: Negative for cough, shortness of breath and wheezing.   Cardiovascular: Negative for chest pain and palpitations.  Gastrointestinal: Negative for abdominal pain, blood in stool, constipation and diarrhea.  Endocrine: Negative for polydipsia and polyuria.  Genitourinary: Negative for dysuria, frequency and urgency.  Musculoskeletal: Positive for arthralgias. Negative for back pain and myalgias.       Knee pain worse on L   Skin: Negative for pallor and rash.  Allergic/Immunologic: Negative for environmental allergies.  Neurological: Negative for  dizziness, syncope and headaches.  Hematological: Negative for adenopathy. Does not bruise/bleed easily.  Psychiatric/Behavioral: Negative for decreased concentration and dysphoric mood. The patient is nervous/anxious.        Stressors        Objective:   Physical Exam  Constitutional: She appears well-developed and well-nourished. No distress.  moribldy obese and well app  HENT:  Head: Normocephalic and atraumatic.  Mouth/Throat: Oropharynx is clear and moist.  Eyes: Pupils are equal, round, and reactive to light. Conjunctivae and EOM are normal. Right eye exhibits no discharge. Left eye exhibits no discharge. No scleral icterus.  Baseline vision impaired   Neck: Normal range of motion. Neck supple. No JVD present. No thyromegaly present.  Cardiovascular: Normal rate and normal heart sounds.  Pulmonary/Chest: Effort normal and breath sounds normal. No respiratory distress. She has no wheezes.  Musculoskeletal: She exhibits tenderness.  Poor rom of knees Cannot bear full wt on L leg Using cane   Lymphadenopathy:    She has no cervical adenopathy.  Neurological: She is alert. She displays no tremor. No cranial nerve deficit. She exhibits normal muscle tone. Coordination normal.  Skin: Skin is warm and dry. No rash noted. No erythema. No pallor.  Psychiatric: She has a normal mood and affect.          Assessment & Plan:   Problem List Items Addressed This Visit      Other   Encounter for chronic pain management - Primary    For chronic knee pain from OA and meniscal injury Taking norco 5-325 #90 per mo  UDS rev  Database rev  No concerns   Refilled for next 3 mo with post dated px  Voiced understanding  F/u 3 mo for next pain management visit

## 2017-05-06 NOTE — Assessment & Plan Note (Signed)
For chronic knee pain from OA and meniscal injury Taking norco 5-325 #90 per mo  UDS rev  Database rev  No concerns   Refilled for next 3 mo with post dated px  Voiced understanding  F/u 3 mo for next pain management visit

## 2017-06-08 ENCOUNTER — Encounter: Payer: Self-pay | Admitting: Family Medicine

## 2017-06-08 DIAGNOSIS — M170 Bilateral primary osteoarthritis of knee: Secondary | ICD-10-CM | POA: Diagnosis not present

## 2017-06-10 ENCOUNTER — Telehealth: Payer: Self-pay | Admitting: Family Medicine

## 2017-06-10 MED ORDER — FEXOFENADINE HCL 180 MG PO TABS: ORAL_TABLET | ORAL | 3 refills | Status: DC

## 2017-06-10 NOTE — Telephone Encounter (Signed)
Allegra refill

## 2017-06-14 NOTE — Telephone Encounter (Signed)
Letter for jury duty  Please let pt know it is done

## 2017-06-28 ENCOUNTER — Encounter: Payer: Self-pay | Admitting: Family Medicine

## 2017-08-03 ENCOUNTER — Encounter: Payer: Self-pay | Admitting: Family Medicine

## 2017-08-03 ENCOUNTER — Ambulatory Visit (INDEPENDENT_AMBULATORY_CARE_PROVIDER_SITE_OTHER): Payer: PPO | Admitting: Family Medicine

## 2017-08-03 VITALS — BP 128/82 | HR 69 | Temp 98.4°F | Ht 60.25 in | Wt 300.5 lb

## 2017-08-03 DIAGNOSIS — G8929 Other chronic pain: Secondary | ICD-10-CM

## 2017-08-03 MED ORDER — HYDROCODONE-ACETAMINOPHEN 5-325 MG PO TABS: 1.0000 | ORAL_TABLET | Freq: Four times a day (QID) | ORAL | 0 refills | Status: DC | PRN

## 2017-08-03 NOTE — Patient Instructions (Signed)
Here are your px for the next 3 months   Follow up in 3 months for next chronic pain visit

## 2017-08-03 NOTE — Progress Notes (Signed)
Subjective:    Patient ID: Monica Madden, female    DOB: 30-Jul-1960, 57 y.o.   MRN: [id number]  HPI Here for encounter for chronic pain management   Reviewed and updated  Today 07/28/17   Indication for chronic opioid: OA/meniscal inj to knees- chronic pain  Past cortisone injections (unable to afford synvisc) More other pain lately= her whole leg L -esp in last few weeks  Back/shoulders at times  Medication and dose: norco 5-325 mg 1 tabl every 6 hours prn pain # pills per month: 90 Last UDS date: 1/19, very low risk (she does use THC for her retinitis pigmentosa) Pain contract signed (date): 01/29/17 Date narcotic database last reviewed (include red flags): today 08/03/17 no red flags  Last refill was on 07/11/17 per the drug data base  Pain Inventory (1-10 worse): Average Pain 6-8 /10 without med/ 2 with Pain Right Now worse than usual  My pain is achy, occ sharp with movement  Pain is worse with: walking/movement  Relief from Meds: fair  In the last 24 hours, has pain interfered with the following (1-10 greatest interference) ? General activity 5 or more  Relation with others none Enjoyment of life significant What TIME of day is your pain at its worst? Early am or after walking      Sleep (in general) fair  Mobility/Function: Assistance device: cane How many minutes can you walk? 20-30 with assistance (also blind)  Ability to climb steps?  only with assist Do you drive? no Disabled (date): over a year- also for legal blindness Neuro/Psych Sx: (bladder, bowel, weakness, dizziness, depression etc) none Prior Studies: Xrays/MRIs  Physicians involved in your care: Any changes since last visit?  orthopedics  at [facility] clinic  Had inj in May - gets every 3 months  She will have to get a knee replacement done   Wt Readings from Last 3 Encounters:  08/03/17 (!) 300 lb 8 oz (136.3 kg)  05/04/17 293 lb 8 oz (133.1 kg)  02/07/17 292 lb 8 oz (132.7 kg)  stress eating  is worse lately  Had guests and vacation eating  58.20 kg/m  Plans on getting away from sweets again   Off of her routine with her husband home  Listens to book on tape   Stress= husb lost job  He filed for disability (CHF)  He was turned down for unemployment   Daughter graduated and is looking for jobs - all over the country    Patient Active Problem List   Diagnosis Date Noted  . Encounter for chronic pain management 02/07/2017  . Encounter for pain management 08/05/2016  . Chronic pain 11/17/2015  . Elevated glucose level 11/09/2015  . Need for hepatitis C screening test 10/20/2014  . Routine general medical examination at a health care facility 06/30/2011  . Amenorrhea 06/30/2011  . Adverse effects of medication 07/01/2010  . DYSPEPSIA 09/26/2009  . IRRITABLE BOWEL SYNDROME 08/07/2009  . KNEE PAIN, BILATERAL 08/07/2009  . CONSTIPATION 01/15/2009  . LEG PAIN, LEFT 10/09/2008  . Hyperlipidemia 05/09/2008  . Essential hypertension 03/29/2008  . 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 06/05/2007  . INCI HERNIA WITHOUT MENTION OBSTRUCTION/GANGRENE 05/12/2007  . FREQUENCY, URINARY 05/12/2007  . ARTHRALGIA 11/03/2006  . DEPENDENT EDEMA, LEGS 11/03/2006  . Morbid obesity (HCC) 07/15/2006  . ANXIETY DEPRESSION 07/15/2006  . MIGRAINE HEADACHE 07/15/2006  . RETINITIS PIGMENTOSA 07/15/2006  . VARICOSE VEINS, LOWER EXTREMITIES 07/15/2006  . ALLERGIC RHINITIS 07/15/2006  . ASTHMA 07/15/2006  .  GERD 07/15/2006  . BACK PAIN, LUMBAR, CHRONIC 07/15/2006  . INCONTINENCE, URGE 07/15/2006   Past Medical History:  Diagnosis Date  . Allergy    allergic rhinitis  . Anemia   . Anxiety    ATTACKS  . Arthritis   . Asthma   . Cataract    left cataract removal 12/2008  . Chronic back pain    OA of spine  . Depression   . GERD (gastroesophageal reflux disease)    Endo negative 02/2000  . Hypertension 03/2008  . IBS (irritable bowel syndrome)   . MRSA infection 2008     Right leg  . Obesity   . Ovarian cyst   . Retinitis pigmentosa    blind   Past Surgical History:  Procedure Laterality Date  . APPENDECTOMY    . BLADDER SURGERY  1968   age 31 ? dilitation  . CATARACT EXTRACTION W/PHACO Right 11/11/2014   Procedure: CATARACT EXTRACTION PHACO AND INTRAOCULAR LENS PLACEMENT (IOC);  Surgeon: Sallee LangeSteven Dingeldein, MD;  Location: [facility] ORS;  Service: Ophthalmology;  Laterality: Right;  LOT PACK: [id number] H [place]: 00:29.7 AP: 11.2 CDE: 7.14  . CESAREAN SECTION    . CHOLECYSTECTOMY    . CYSTOSCOPY  03/2008   negative// Dr. Wanda PlumpHumphries urologist  . ENDOMETRIAL BIOPSY  07/1998   negative  . EYE SURGERY  12/2008   cataract removal left  . HERNIA REPAIR  05/2006   ruptured hernia repair after fall and then two more surgeries within as many weeks for complications(05/2006) Umbilical hernia repair (06/1997)  . KNEE ARTHROSCOPY W/ MENISCAL REPAIR     left knne   Social History   Tobacco Use  . Smoking status: Never Smoker  . Smokeless tobacco: Never Used  Substance Use Topics  . Alcohol use: Yes    Comment: twice a year  . Drug use: Yes    Types: Marijuana   Family History  Problem Relation Age of Onset  . Depression Mother   . Heart disease Father   . Diabetes Maternal Uncle   . Hypertension Maternal Grandmother   . Depression Brother        Depression secondary to MVA injuries  . Hypertension Brother   . Heart disease Paternal Grandfather   . Heart disease Paternal Grandmother    Allergies  Allergen Reactions  . Diclofenac Sodium     REACTION: allergic/ made asthma bad  . Etodolac     REACTION: GI  . Metronidazole     REACTION: GI side eff  . Nsaids Other (See Comments)    GI Upset  . Oxybutynin Itching  . Sulfamethoxazole-Trimethoprim   . Tolterodine Tartrate     REACTION: None Effective  . Tramadol Other (See Comments)    Caused elevated BP  . Sulfa Antibiotics Itching and Rash   Current Outpatient Medications on File Prior to Visit   Medication Sig Dispense Refill  . acyclovir ointment (ZOVIRAX) 5 % Apply to fever blister 5 times daily for 4 days. 15 g 0  . albuterol (VENTOLIN HFA) 108 (90 BASE) MCG/ACT inhaler As needed for asthma attack     . ALPRAZolam (XANAX) 1 MG tablet TAKE ONE TABLET BY MOUTH 3 TIMES DAILY AS NEEDED 90 tablet 3  . Ascorbic Acid (VITAMIN C) 1000 MG tablet Take 1,000 mg by mouth daily.      . Calcium Carbonate (CALCIUM 600) 1500 MG TABS Take as directed.     . Coenzyme Q10 (CO Q 10 PO) Take 1 tablet by mouth  daily.    . fexofenadine (ALLERGY RELIEF) 180 MG tablet TAKE ONE (1) TABLET BY MOUTH EVERY DAY 90 tablet 3  . fluticasone (FLONASE) 50 MCG/ACT nasal spray Place 2 sprays into both nostrils 2 (two) times daily as needed for allergies or rhinitis. 2 puffs in each nostril daily 48 g 1  . lisinopril (PRINIVIL,ZESTRIL) 5 MG tablet Take 1 tablet (5 mg total) by mouth every morning. 90 tablet 1  . montelukast (SINGULAIR) 10 MG tablet TAKE ONE (1) TABLET EACH DAY 90 tablet 1  . Omega 3-6-9 Fatty Acids (OMEGA 3-6-9 COMPLEX PO) Take one tablet by mouth daily    . omeprazole (PRILOSEC) 40 MG capsule TAKE ONE (1) CAPSULE EACH DAY 90 capsule 1  . sertraline (ZOLOFT) 50 MG tablet Take 1 tablet (50 mg total) by mouth daily. 90 tablet 1  . tolterodine (DETROL LA) 4 MG 24 hr capsule TAKE ONE CAPSULE BY MOUTH DAILY 90 capsule 1   No current facility-administered medications on file prior to visit.     Review of Systems  Constitutional: Positive for fatigue. Negative for activity change, appetite change, fever and unexpected weight change.  HENT: Negative for congestion, ear pain, rhinorrhea, sinus pressure and sore throat.   Eyes: Negative for pain, redness and visual disturbance.  Respiratory: Negative for cough, shortness of breath and wheezing.   Cardiovascular: Negative for chest pain and palpitations.  Gastrointestinal: Negative for abdominal pain, blood in stool, constipation and diarrhea.  Endocrine:  Negative for polydipsia and polyuria.  Genitourinary: Negative for dysuria, frequency and urgency.  Musculoskeletal: Positive for arthralgias and joint swelling. Negative for back pain and myalgias.  Skin: Negative for pallor and rash.  Allergic/Immunologic: Negative for environmental allergies.  Neurological: Negative for dizziness, syncope and headaches.  Hematological: Negative for adenopathy. Does not bruise/bleed easily.  Psychiatric/Behavioral: Negative for decreased concentration and dysphoric mood. The patient is not nervous/anxious.        Objective:   Physical Exam  Constitutional: She appears well-developed and well-nourished. No distress.  obese and well appearing   HENT:  Head: Normocephalic and atraumatic.  Mouth/Throat: Oropharynx is clear and moist.  Eyes: Pupils are equal, round, and reactive to light. Conjunctivae and EOM are normal. No scleral icterus.  Vision impaired  Neck: Normal range of motion. Neck supple.  Cardiovascular: Normal rate, regular rhythm and normal heart sounds.  Pulmonary/Chest: Effort normal and breath sounds normal. No stridor. No respiratory distress.  Musculoskeletal: She exhibits edema and tenderness.  Poor rom of knees  Labored gait  Lymphadenopathy:    She has no cervical adenopathy.  Neurological: She is alert. She displays normal reflexes. No cranial nerve deficit. Coordination normal.  Skin: Skin is warm and dry. No rash noted.  Psychiatric: She has a normal mood and affect.          Assessment & Plan:   Problem List Items Addressed This Visit      Other   Chronic pain    Continues injections for end stage knee OA Needs a knee replacement- she is considering it  norco refilled      Relevant Medications   HYDROcodone-acetaminophen (NORCO/VICODIN) 5-325 MG tablet   Encounter for chronic pain management - Primary    Chronic knee pain/ in need of knee repl  Also morbid obesity  Refilled #90 norco per mo for 3 months in  3 separate px  No problems or significant changes [place] data base rev  utd pain contract and UDS F/u 3 mo

## 2017-08-06 NOTE — Assessment & Plan Note (Signed)
Chronic knee pain/ in need of knee repl  Also morbid obesity  Refilled #90 norco per mo for 3 months in 3 separate px  No problems or significant changes  data base rev  utd pain contract and UDS F/u 3 mo

## 2017-08-06 NOTE — Assessment & Plan Note (Signed)
Continues injections for end stage knee OA Needs a knee replacement- she is considering it  norco refilled

## 2017-08-14 ENCOUNTER — Other Ambulatory Visit: Payer: Self-pay | Admitting: Family Medicine

## 2017-08-18 ENCOUNTER — Other Ambulatory Visit: Payer: Self-pay | Admitting: Family Medicine

## 2017-08-18 NOTE — Telephone Encounter (Signed)
Name of Medication: xanax Name of Pharmacy: [facility] on file Last Fill or Written Date and Quantity: 04/15/17 #90 with 3 refills Last Office Visit and Type: 08/03/17 Stop Act appt Next Office Visit and Type: 10/32/19 Stop Act appt Last Controlled Substance Agreement Date: 02/07/17 Last UDS:02/07/17

## 2017-09-14 DIAGNOSIS — M7581 Other shoulder lesions, right shoulder: Secondary | ICD-10-CM | POA: Diagnosis not present

## 2017-09-14 DIAGNOSIS — M7521 Bicipital tendinitis, right shoulder: Secondary | ICD-10-CM | POA: Diagnosis not present

## 2017-09-14 DIAGNOSIS — M1712 Unilateral primary osteoarthritis, left knee: Secondary | ICD-10-CM | POA: Diagnosis not present

## 2017-10-19 DIAGNOSIS — J4520 Mild intermittent asthma, uncomplicated: Secondary | ICD-10-CM | POA: Diagnosis not present

## 2017-10-19 DIAGNOSIS — Z23 Encounter for immunization: Secondary | ICD-10-CM | POA: Diagnosis not present

## 2017-10-19 DIAGNOSIS — R0609 Other forms of dyspnea: Secondary | ICD-10-CM | POA: Diagnosis not present

## 2017-10-26 ENCOUNTER — Other Ambulatory Visit: Payer: Self-pay | Admitting: Family Medicine

## 2017-10-26 DIAGNOSIS — Z1231 Encounter for screening mammogram for malignant neoplasm of breast: Secondary | ICD-10-CM

## 2017-11-09 ENCOUNTER — Ambulatory Visit (INDEPENDENT_AMBULATORY_CARE_PROVIDER_SITE_OTHER): Payer: PPO | Admitting: Family Medicine

## 2017-11-09 ENCOUNTER — Encounter: Payer: Self-pay | Admitting: Family Medicine

## 2017-11-09 VITALS — BP 122/76 | HR 75 | Temp 97.7°F | Ht 60.25 in | Wt 308.8 lb

## 2017-11-09 DIAGNOSIS — R52 Pain, unspecified: Secondary | ICD-10-CM | POA: Diagnosis not present

## 2017-11-09 DIAGNOSIS — G8929 Other chronic pain: Secondary | ICD-10-CM | POA: Diagnosis not present

## 2017-11-09 MED ORDER — HYDROCODONE-ACETAMINOPHEN 5-325 MG PO TABS: 1.0000 | ORAL_TABLET | Freq: Four times a day (QID) | ORAL | 0 refills | Status: DC | PRN

## 2017-11-09 NOTE — Progress Notes (Signed)
Subjective:    Patient ID: Monica Madden, female    DOB: 11/27/1960, 57 y.o.   MRN: [id number]  HPI  Here for 3 mo encounter for chronic opioid use /pain management   Has been doing ok / hanging in there  Husband is still trying to get disability  Daughter got a job in [place] (loves it)    Indication for chronic opioid: OA/meniscal injury to knees with chronic pain and mobility impairment  Past cortisone injections  Cannot afford lubricant injections  occ back and shoulders also hurt   Medication and dose: norco 5-325 mg 1 tab up to every 6 hours prn pain  # pills per month: 90   Last UDS date: 02/07/17 low risk  Opioid Treatment Agreement signed (Y/N) 02/07/17  Opioid Treatment Agreement last reviewed with patient:  02/07/17  NCCSRS reviewed this encounter (include red flags):  today 11/09/17  Last refill of norco was 10/10/17 for 90 pills of norco per drug data base   Pain inventory  avg  6 out of 10   (worse before she takes her pain medication)  Presently -same   Mobility-impaired/ cannot use steps at all Uses cane  Makes it hard to loose weight   Quality of life - not optimal   Side effects -none from medicine   Wt Readings from Last 3 Encounters:  11/09/17 (!) 308 lb 12 oz (140 kg)  08/03/17 (!) 300 lb 8 oz (136.3 kg)  05/04/17 293 lb 8 oz (133.1 kg)  gained wt since husband lost his job Is more mindfusl this week  59.80 kg/m   Saw ortho Dr Rosita Kea ([place]) in [place] for knee and shoulder pain e inj done L  Knee Told she eventually needs a total knee replacement  Would need to go to rehab after surgery  Will depend on insurance - re enrollment  Gait is so off it hurts her back and body  Patient Active Problem List   Diagnosis Date Noted  . Encounter for chronic pain management 02/07/2017  . Encounter for pain management 08/05/2016  . Chronic pain 11/17/2015  . Elevated glucose level 11/09/2015  . Need for hepatitis C screening test 10/20/2014  .  Routine general medical examination at a health care facility 06/30/2011  . Amenorrhea 06/30/2011  . Adverse effects of medication 07/01/2010  . DYSPEPSIA 09/26/2009  . IRRITABLE BOWEL SYNDROME 08/07/2009  . KNEE PAIN, BILATERAL 08/07/2009  . CONSTIPATION 01/15/2009  . LEG PAIN, LEFT 10/09/2008  . Hyperlipidemia 05/09/2008  . Essential hypertension 03/29/2008  . 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 06/05/2007  . INCI HERNIA WITHOUT MENTION OBSTRUCTION/GANGRENE 05/12/2007  . FREQUENCY, URINARY 05/12/2007  . ARTHRALGIA 11/03/2006  . DEPENDENT EDEMA, LEGS 11/03/2006  . Morbid obesity (HCC) 07/15/2006  . ANXIETY DEPRESSION 07/15/2006  . MIGRAINE HEADACHE 07/15/2006  . RETINITIS PIGMENTOSA 07/15/2006  . VARICOSE VEINS, LOWER EXTREMITIES 07/15/2006  . ALLERGIC RHINITIS 07/15/2006  . ASTHMA 07/15/2006  . GERD 07/15/2006  . BACK PAIN, LUMBAR, CHRONIC 07/15/2006  . INCONTINENCE, URGE 07/15/2006   Past Medical History:  Diagnosis Date  . Allergy    allergic rhinitis  . Anemia   . Anxiety    ATTACKS  . Arthritis   . Asthma   . Cataract    left cataract removal 12/2008  . Chronic back pain    OA of spine  . Depression   . GERD (gastroesophageal reflux disease)    Endo negative 02/2000  . Hypertension 03/2008  .  IBS (irritable bowel syndrome)   . MRSA infection 2008   Right leg  . Obesity   . Ovarian cyst   . Retinitis pigmentosa    blind   Past Surgical History:  Procedure Laterality Date  . APPENDECTOMY    . BLADDER SURGERY  1968   age 10 ? dilitation  . CATARACT EXTRACTION W/PHACO Right 11/11/2014   Procedure: CATARACT EXTRACTION PHACO AND INTRAOCULAR LENS PLACEMENT (IOC);  Surgeon: Sallee Lange, MD;  Location: [facility] ORS;  Service: Ophthalmology;  Laterality: Right;  LOT PACK: [id number] H [place]: 00:29.7 AP: 11.2 CDE: 7.14  . CESAREAN SECTION    . CHOLECYSTECTOMY    . CYSTOSCOPY  03/2008   negative// Dr. Wanda Plump urologist  . ENDOMETRIAL BIOPSY  07/1998    negative  . EYE SURGERY  12/2008   cataract removal left  . HERNIA REPAIR  05/2006   ruptured hernia repair after fall and then two more surgeries within as many weeks for complications(05/2006) Umbilical hernia repair (06/1997)  . KNEE ARTHROSCOPY W/ MENISCAL REPAIR     left knne   Social History   Tobacco Use  . Smoking status: Never Smoker  . Smokeless tobacco: Never Used  Substance Use Topics  . Alcohol use: Yes    Comment: twice a year  . Drug use: Yes    Types: Marijuana   Family History  Problem Relation Age of Onset  . Depression Mother   . Heart disease Father   . Diabetes Maternal Uncle   . Hypertension Maternal Grandmother   . Depression Brother        Depression secondary to MVA injuries  . Hypertension Brother   . Heart disease Paternal Grandfather   . Heart disease Paternal Grandmother    Allergies  Allergen Reactions  . Diclofenac Sodium     REACTION: allergic/ made asthma bad  . Etodolac     REACTION: GI  . Metronidazole     REACTION: GI side eff  . Nsaids Other (See Comments)    GI Upset  . Oxybutynin Itching  . Sulfamethoxazole-Trimethoprim   . Tolterodine Tartrate     REACTION: None Effective  . Tramadol Other (See Comments)    Caused elevated BP  . Sulfa Antibiotics Itching and Rash   Current Outpatient Medications on File Prior to Visit  Medication Sig Dispense Refill  . acyclovir ointment (ZOVIRAX) 5 % Apply to fever blister 5 times daily for 4 days. 15 g 0  . albuterol (VENTOLIN HFA) 108 (90 BASE) MCG/ACT inhaler As needed for asthma attack     . ALPRAZolam (XANAX) 1 MG tablet take 1 tablet by mouth three times a day if needed 90 tablet 3  . Ascorbic Acid (VITAMIN C) 1000 MG tablet Take 1,000 mg by mouth daily.      . Calcium Carbonate (CALCIUM 600) 1500 MG TABS Take as directed.     . Coenzyme Q10 (CO Q 10 PO) Take 1 tablet by mouth daily.    . fexofenadine (ALLERGY RELIEF) 180 MG tablet TAKE ONE (1) TABLET BY MOUTH EVERY DAY 90  tablet 3  . fluticasone (FLONASE) 50 MCG/ACT nasal spray instill 2 sprays into each nostril once daily 48 g 1  . lisinopril (PRINIVIL,ZESTRIL) 5 MG tablet take 1 tablet by mouth every morning 90 tablet 1  . montelukast (SINGULAIR) 10 MG tablet take 1 tablet by mouth once daily 90 tablet 1  . Omega 3-6-9 Fatty Acids (OMEGA 3-6-9 COMPLEX PO) Take one tablet by mouth daily    .  omeprazole (PRILOSEC) 40 MG capsule take 1 capsule by mouth once daily 90 capsule 1  . sertraline (ZOLOFT) 50 MG tablet take 1 tablet by mouth once daily 90 tablet 1  . tolterodine (DETROL LA) 4 MG 24 hr capsule TAKE ONE CAPSULE BY MOUTH DAILY 90 capsule 1   No current facility-administered medications on file prior to visit.      Review of Systems  Constitutional: Negative for activity change, appetite change, fatigue, fever and unexpected weight change.  HENT: Negative for congestion, ear pain, rhinorrhea, sinus pressure and sore throat.   Eyes: Positive for visual disturbance. Negative for pain and redness.       Baseline-legally blind   Respiratory: Negative for cough, shortness of breath and wheezing.   Cardiovascular: Negative for chest pain and palpitations.  Gastrointestinal: Negative for abdominal pain, blood in stool, constipation and diarrhea.  Endocrine: Negative for polydipsia and polyuria.  Genitourinary: Negative for dysuria, frequency and urgency.  Musculoskeletal: Positive for arthralgias and gait problem. Negative for back pain and myalgias.  Skin: Negative for pallor and rash.  Allergic/Immunologic: Negative for environmental allergies.  Neurological: Negative for dizziness, syncope and headaches.  Hematological: Negative for adenopathy. Does not bruise/bleed easily.  Psychiatric/Behavioral: Negative for decreased concentration and dysphoric mood. The patient is not nervous/anxious.        Stressors       Objective:   Physical Exam  Constitutional: She appears well-developed and  well-nourished. No distress.  Morbidly obese and well appearing   HENT:  Head: Normocephalic and atraumatic.  Mouth/Throat: Oropharynx is clear and moist.  Eyes: Pupils are equal, round, and reactive to light. EOM are normal. No scleral icterus.  Neck: Normal range of motion. Neck supple.  Cardiovascular: Normal rate, regular rhythm and normal heart sounds.  Pulmonary/Chest: Effort normal and breath sounds normal. No respiratory distress. She has no wheezes. She has no rales.  Musculoskeletal:  Poor rom of knees and spine Walks with cane - some difficulty  Lymphadenopathy:    She has no cervical adenopathy.  Neurological: She is alert. She displays normal reflexes. No cranial nerve deficit. Coordination normal.  Skin: Skin is warm. No rash noted. No pallor.  Psychiatric: She has a normal mood and affect.  Pleasant and talkative           Assessment & Plan:   Problem List Items Addressed This Visit      Other   Chronic pain    End stage OA -needs knee replacement  Financially unable to schedule yet  Cannot be active to loose wt  Refilled norco for 1 mo - enc for chronic pain man today      Relevant Medications   HYDROcodone-acetaminophen (NORCO/VICODIN) 5-325 MG tablet   HYDROcodone-acetaminophen (NORCO) 5-325 MG tablet   HYDROcodone-acetaminophen (NORCO) 5-325 MG tablet   Encounter for chronic pain management - Primary   Encounter for pain management    norco for chronic knee pain (in need of TKA- cannot schedule financially at this time)  Getting injections Refilled medication 90 per mo (3 sep month px)  UDS/contract utd and NCCS rev  F/u 3 mo      Morbid obesity (HCC)    Discussed how this problem influences overall health and the risks it imposes  Reviewed plan for weight loss with lower calorie diet (via better food choices and also portion control or program like weight watchers) and exercise building up to or more than 30 minutes 5 days per week including some  aerobic activity   Cannot be active due to OA and blindness TKA may help in future if she can get one

## 2017-11-09 NOTE — Patient Instructions (Signed)
I refilled your medication   Continue orthopedic follow up as planned Take care of yourself

## 2017-11-11 ENCOUNTER — Other Ambulatory Visit: Payer: Self-pay | Admitting: Family Medicine

## 2017-11-13 NOTE — Assessment & Plan Note (Signed)
End stage OA -needs knee replacement  Financially unable to schedule yet  Cannot be active to loose wt  Refilled norco for 1 mo - enc for chronic pain man today

## 2017-11-13 NOTE — Assessment & Plan Note (Signed)
Discussed how this problem influences overall health and the risks it imposes  Reviewed plan for weight loss with lower calorie diet (via better food choices and also portion control or program like weight watchers) and exercise building up to or more than 30 minutes 5 days per week including some aerobic activity   Cannot be active due to OA and blindness TKA may help in future if she can get one

## 2017-11-13 NOTE — Assessment & Plan Note (Signed)
norco for chronic knee pain (in need of TKA- cannot schedule financially at this time)  Getting injections Refilled medication 90 per mo (3 sep month px)  UDS/contract utd and NCCS rev  F/u 3 mo

## 2017-11-21 ENCOUNTER — Telehealth: Payer: Self-pay | Admitting: Family Medicine

## 2017-11-21 DIAGNOSIS — E7800 Pure hypercholesterolemia, unspecified: Secondary | ICD-10-CM

## 2017-11-21 DIAGNOSIS — R7309 Other abnormal glucose: Secondary | ICD-10-CM

## 2017-11-21 DIAGNOSIS — I10 Essential (primary) hypertension: Secondary | ICD-10-CM

## 2017-11-21 NOTE — Telephone Encounter (Signed)
-----   Message from Robert Bellow, LPN sent at 16/01/958  2:00 PM EST ----- Regarding: Labs 11/5 Lab orders needed. Thank you.  Insurance:  [email or web address]

## 2017-11-22 ENCOUNTER — Other Ambulatory Visit: Payer: PPO

## 2017-11-22 ENCOUNTER — Ambulatory Visit (INDEPENDENT_AMBULATORY_CARE_PROVIDER_SITE_OTHER): Payer: PPO

## 2017-11-22 VITALS — BP 122/70 | HR 82 | Temp 98.5°F | Ht 62.0 in | Wt 308.5 lb

## 2017-11-22 DIAGNOSIS — Z0000 Encounter for general adult medical examination without abnormal findings: Secondary | ICD-10-CM

## 2017-11-22 DIAGNOSIS — R7309 Other abnormal glucose: Secondary | ICD-10-CM

## 2017-11-22 DIAGNOSIS — E7800 Pure hypercholesterolemia, unspecified: Secondary | ICD-10-CM

## 2017-11-22 DIAGNOSIS — I10 Essential (primary) hypertension: Secondary | ICD-10-CM | POA: Diagnosis not present

## 2017-11-22 LAB — COMPREHENSIVE METABOLIC PANEL
ALT: 18 U/L (ref 0–35)
AST: 12 U/L (ref 0–37)
Albumin: 4.2 g/dL (ref 3.5–5.2)
Alkaline Phosphatase: 60 U/L (ref 39–117)
BUN: 22 mg/dL (ref 6–23)
CHLORIDE: 103 meq/L (ref 96–112)
CO2: 29 meq/L (ref 19–32)
Calcium: 9.5 mg/dL (ref 8.4–10.5)
Creatinine, Ser: 0.88 mg/dL (ref 0.40–1.20)
GFR: 70.81 mL/min (ref >60.00)
GLUCOSE: 105 mg/dL — AB (ref 70–99)
POTASSIUM: 4.3 meq/L (ref 3.5–5.1)
SODIUM: 140 meq/L (ref 135–145)
Total Bilirubin: 0.5 mg/dL (ref 0.2–1.2)
Total Protein: 7 g/dL (ref 6.0–8.3)

## 2017-11-22 LAB — CBC WITH DIFFERENTIAL/PLATELET
Basophils Absolute: 0.1 10*3/uL (ref 0.0–0.1)
Basophils Relative: 0.9 % (ref 0.0–3.0)
EOS PCT: 3.7 % (ref 0.0–5.0)
Eosinophils Absolute: 0.3 10*3/uL (ref 0.0–0.7)
HCT: 38.6 % (ref 36.0–46.0)
Hemoglobin: 13.1 g/dL (ref 12.0–15.0)
LYMPHS ABS: 1.9 10*3/uL (ref 0.7–4.0)
Lymphocytes Relative: 26.7 % (ref 12.0–46.0)
MCHC: 34 g/dL (ref 30.0–36.0)
MCV: 87.6 fl (ref 78.0–100.0)
MONOS PCT: 6.6 % (ref 3.0–12.0)
Monocytes Absolute: 0.5 10*3/uL (ref 0.1–1.0)
NEUTROS PCT: 62.1 % (ref 43.0–77.0)
Neutro Abs: 4.4 10*3/uL (ref 1.4–7.7)
Platelets: 250 10*3/uL (ref 150.0–400.0)
RBC: 4.4 Mil/uL (ref 3.87–5.11)
RDW: 14.4 % (ref 11.5–15.5)
WBC: 7.1 10*3/uL (ref 4.0–10.5)

## 2017-11-22 LAB — LIPID PANEL
Cholesterol: 199 mg/dL (ref 0–200)
HDL: 39.2 mg/dL (ref >39.00)
NonHDL: 160.23
Total CHOL/HDL Ratio: 5
Triglycerides: 272 mg/dL — ABNORMAL HIGH (ref 0.0–149.0)
VLDL: 54.4 mg/dL — AB (ref 0.0–40.0)

## 2017-11-22 LAB — HEMOGLOBIN A1C: HEMOGLOBIN A1C: 5.6 % (ref 4.6–6.5)

## 2017-11-22 LAB — LDL CHOLESTEROL, DIRECT: Direct LDL: 135 mg/dL

## 2017-11-22 LAB — TSH: TSH: 1.76 u[IU]/mL (ref 0.35–4.50)

## 2017-11-22 NOTE — Patient Instructions (Signed)
Monica Madden , Thank you for taking time to come for your Medicare Wellness Visit. I appreciate your ongoing commitment to your health goals. Please review the following plan we discussed and let me know if I can assist you in the future.   These are the goals we discussed: Goals    . Eat more fruits and vegetables     Starting 11/22/2017, I will attempt to eat 4-5 servings of fresh fruits and vegetables.        This is a list of the screening recommended for you and due dates:  Health Maintenance  Topic Date Due  . Pap Smear  08/19/2018*  . HIV Screening  02/20/2023*  . Mammogram  11/24/2017  . Colon Cancer Screening  10/03/2019  . Tetanus Vaccine  10/29/2024  . Flu Shot  Completed  .  Hepatitis C: One time screening is recommended by Center for Disease Control  (CDC) for  adults born from [birth year] through 1963.   Completed  *Topic was postponed. The date shown is not the original due date.   Preventive Care for Adults  A healthy lifestyle and preventive care can promote health and wellness. Preventive health guidelines for adults include the following key practices.  . A routine yearly physical is a good way to check with your health care provider about your health and preventive screening. It is a chance to share any concerns and updates on your health and to receive a thorough exam.  . Visit your dentist for a routine exam and preventive care every 6 months. Brush your teeth twice a day and floss once a day. Good oral hygiene prevents tooth decay and gum disease.  . The frequency of eye exams is based on your age, health, family medical history, use  of contact lenses, and other factors. Follow your health care provider's recommendations for frequency of eye exams.  . Eat a healthy diet. Foods like vegetables, fruits, whole grains, low-fat dairy products, and lean protein foods contain the nutrients you need without too many calories. Decrease your intake of foods high in solid fats,  added sugars, and salt. Eat the right amount of calories for you. Get information about a proper diet from your health care provider, if necessary.  . Regular physical exercise is one of the most important things you can do for your health. Most adults should get at least 150 minutes of moderate-intensity exercise (any activity that increases your heart rate and causes you to sweat) each week. In addition, most adults need muscle-strengthening exercises on 2 or more days a week.  Silver Sneakers may be a benefit available to you. To determine eligibility, you may visit the website: www.silversneakers.com or contact program at (762)707-5863 Mon-Fri between 8AM-8PM.   . Maintain a healthy weight. The body mass index (BMI) is a screening tool to identify possible weight problems. It provides an estimate of body fat based on height and weight. Your health care provider can find your BMI and can help you achieve or maintain a healthy weight.   For adults 20 years and older: ? A BMI below 18.5 is considered underweight. ? A BMI of 18.5 to 24.9 is normal. ? A BMI of 25 to 29.9 is considered overweight. ? A BMI of 30 and above is considered obese.   . Maintain normal blood lipids and cholesterol levels by exercising and minimizing your intake of saturated fat. Eat a balanced diet with plenty of fruit and vegetables. Blood tests for lipids and  cholesterol should begin at age 65 and be repeated every 5 years. If your lipid or cholesterol levels are high, you are over 50, or you are at high risk for heart disease, you may need your cholesterol levels checked more frequently. Ongoing high lipid and cholesterol levels should be treated with medicines if diet and exercise are not working.  . If you smoke, find out from your health care provider how to quit. If you do not use tobacco, please do not start.  . If you choose to drink alcohol, please do not consume more than 2 drinks per day. One drink is  considered to be 12 ounces (355 mL) of beer, 5 ounces (148 mL) of wine, or 1.5 ounces (44 mL) of liquor.  . If you are 24-39 years old, ask your health care provider if you should take aspirin to prevent strokes.  . Use sunscreen. Apply sunscreen liberally and repeatedly throughout the day. You should seek shade when your shadow is shorter than you. Protect yourself by wearing long sleeves, pants, a wide-brimmed hat, and sunglasses year round, whenever you are outdoors.  . Once a month, do a whole body skin exam, using a mirror to look at the skin on your back. Tell your health care provider of new moles, moles that have irregular borders, moles that are larger than a pencil eraser, or moles that have changed in shape or color.

## 2017-11-24 NOTE — Progress Notes (Signed)
PCP notes:   Health maintenance:  Mammogram - appt scheduled  Abnormal screenings:   Depression score: 5 Depression screen [facility] 2/9 11/22/2017 03/25/2016 11/17/2015  Decreased Interest 0 0 0  Down, Depressed, Hopeless 2 0 0  PHQ - 2 Score 2 0 0  Altered sleeping 1 - -  Tired, decreased energy 1 - -  Change in appetite 1 - -  Feeling bad or failure about yourself  0 - -  Trouble concentrating 0 - -  Moving slowly or fidgety/restless 0 - -  Suicidal thoughts 0 - -  PHQ-9 Score 5 - -  Difficult doing work/chores Not difficult at all - -   Patient concerns:   None  Nurse concerns:  None  Next PCP appt:   11/29/17 @ 1015  I reviewed health advisor's note, was available for consultation, and agree with documentation and plan. Roxy Manns MD

## 2017-11-24 NOTE — Progress Notes (Signed)
Subjective:   Monica Madden is a 57 y.o. female who presents for Medicare Annual (Subsequent) preventive examination.  Review of Systems:  N/A Cardiac Risk Factors include: advanced age (>62men, >43 women);obesity (BMI >30kg/m2);dyslipidemia;hypertension     Objective:     Vitals: BP 122/70 (BP Location: Right Arm, Patient Position: Sitting, Cuff Size: Large)   Pulse 82   Temp 98.5 F (36.9 C) (Oral)   Ht 5\' 2"  (1.575 m) Comment: shoes  Wt (!) 308 lb 8 oz (139.9 kg)   SpO2 98%   BMI 56.43 kg/m   Body mass index is 56.43 kg/m.  Advanced Directives 11/22/2017 03/25/2016 11/11/2014  Does Patient Have a Medical Advance Directive? Yes Yes Yes  Type of Advance Directive Living will Healthcare Power of [place];Living will Living will  Does patient want to make changes to medical advance directive? - - No - Patient declined  Copy of Healthcare Power of Attorney in Chart? - No - copy requested No - copy requested    Tobacco Social History   Tobacco Use  Smoking Status Never Smoker  Smokeless Tobacco Never Used     Counseling given: No   Clinical Intake:  Pre-visit preparation completed: Yes  Pain Score: 5      Nutritional Status: BMI > 30  Obese Nutritional Risks: None Diabetes: No  How often do you need to have someone help you when you read instructions, pamphlets, or other written materials from your doctor or pharmacy?: 5 - Always What is the last grade level you completed in school?: Associate degree  Interpreter Needed?: No  Comments: pt lives with spouse Information entered by :: LPinson, LPN  Past Medical History:  Diagnosis Date  . Allergy    allergic rhinitis  . Anemia   . Anxiety    ATTACKS  . Arthritis   . Asthma   . Cataract    left cataract removal 12/2008  . Chronic back pain    OA of spine  . Depression   . GERD (gastroesophageal reflux disease)    Endo negative 02/2000  . Hypertension 03/2008  . IBS (irritable bowel syndrome)     . MRSA infection 2008   Right leg  . Obesity   . Ovarian cyst   . Retinitis pigmentosa    blind   Past Surgical History:  Procedure Laterality Date  . APPENDECTOMY    . BLADDER SURGERY  1968   age 44 ? dilitation  . CATARACT EXTRACTION W/PHACO Right 11/11/2014   Procedure: CATARACT EXTRACTION PHACO AND INTRAOCULAR LENS PLACEMENT (IOC);  Surgeon: Sallee Lange, MD;  Location: [facility] ORS;  Service: Ophthalmology;  Laterality: Right;  LOT PACK: [id number] H [place]: 00:29.7 AP: 11.2 CDE: 7.14  . CESAREAN SECTION    . CHOLECYSTECTOMY    . CYSTOSCOPY  03/2008   negative// Dr. Wanda Plump urologist  . ENDOMETRIAL BIOPSY  07/1998   negative  . EYE SURGERY  12/2008   cataract removal left  . HERNIA REPAIR  05/2006   ruptured hernia repair after fall and then two more surgeries within as many weeks for complications(05/2006) Umbilical hernia repair (06/1997)  . KNEE ARTHROSCOPY W/ MENISCAL REPAIR     left knne   Family History  Problem Relation Age of Onset  . Depression Mother   . Heart disease Father   . Diabetes Maternal Uncle   . Hypertension Maternal Grandmother   . Depression Brother        Depression secondary to MVA injuries  . Hypertension Brother   .  Heart disease Paternal Grandfather   . Heart disease Paternal Grandmother    Social History   Socioeconomic History  . Marital status: Married    Spouse name: Not on file  . Number of children: Not on file  . Years of education: Not on file  . Highest education level: Not on file  Occupational History  . Not on file  Social Needs  . Financial resource strain: Not on file  . Food insecurity:    Worry: Not on file    Inability: Not on file  . Transportation needs:    Medical: Not on file    Non-medical: Not on file  Tobacco Use  . Smoking status: Never Smoker  . Smokeless tobacco: Never Used  Substance and Sexual Activity  . Alcohol use: Yes    Comment: twice a year  . Drug use: Yes    Types: Marijuana  . Sexual  activity: Never  Lifestyle  . Physical activity:    Days per week: Not on file    Minutes per session: Not on file  . Stress: Not on file  Relationships  . Social connections:    Talks on phone: Not on file    Gets together: Not on file    Attends religious service: Not on file    Active member of club or organization: Not on file    Attends meetings of clubs or organizations: Not on file    Relationship status: Not on file  Other Topics Concern  . Not on file  Social History Narrative   Vision impaired     Outpatient Encounter Medications as of 11/22/2017  Medication Sig  . acyclovir ointment (ZOVIRAX) 5 % Apply to fever blister 5 times daily for 4 days.  Marland Kitchen albuterol (VENTOLIN HFA) 108 (90 BASE) MCG/ACT inhaler As needed for asthma attack   . ALPRAZolam (XANAX) 1 MG tablet take 1 tablet by mouth three times a day if needed  . Ascorbic Acid (VITAMIN C) 1000 MG tablet Take 1,000 mg by mouth daily.    . Calcium Carbonate (CALCIUM 600) 1500 MG TABS Take as directed.   . Coenzyme Q10 (CO Q 10 PO) Take 1 tablet by mouth daily.  . fexofenadine (ALLERGY RELIEF) 180 MG tablet TAKE ONE (1) TABLET BY MOUTH EVERY DAY  . fluticasone (FLONASE) 50 MCG/ACT nasal spray INSTILL 2 SPRAYS INTO EACH NOSTRIL TWICE A DAY  . HYDROcodone-acetaminophen (NORCO) 5-325 MG tablet Take 1 tablet by mouth every 6 (six) hours as needed for moderate pain or severe pain. Do not fill before 12/09/17  . HYDROcodone-acetaminophen (NORCO) 5-325 MG tablet Take 1 tablet by mouth every 6 (six) hours as needed for moderate pain or severe pain. Do not fill before 01/08/18  . HYDROcodone-acetaminophen (NORCO/VICODIN) 5-325 MG tablet Take 1 tablet by mouth every 6 (six) hours as needed. Do not fill until 11/08/17  . lisinopril (PRINIVIL,ZESTRIL) 5 MG tablet take 1 tablet by mouth every morning  . montelukast (SINGULAIR) 10 MG tablet take 1 tablet by mouth once daily  . Omega 3-6-9 Fatty Acids (OMEGA 3-6-9 COMPLEX PO) Take one  tablet by mouth daily  . omeprazole (PRILOSEC) 40 MG capsule take 1 capsule by mouth once daily  . sertraline (ZOLOFT) 50 MG tablet take 1 tablet by mouth once daily  . tolterodine (DETROL LA) 4 MG 24 hr capsule TAKE ONE CAPSULE BY MOUTH DAILY   No facility-administered encounter medications on file as of 11/22/2017.     Activities of Daily  Living In your present state of health, do you have any difficulty performing the following activities: 11/22/2017  Hearing? Y  Vision? Y  Difficulty concentrating or making decisions? N  Walking or climbing stairs? Y  Dressing or bathing? Y  Doing errands, shopping? Y  Preparing Food and eating ? Y  Using the Toilet? N  In the past six months, have you accidently leaked urine? Y  Do you have problems with loss of bowel control? N  Managing your Medications? N  Managing your Finances? N  Housekeeping or managing your Housekeeping? Y  Some recent data might be hidden    Patient Care Team: Tower, Audrie Gallus, MD as PCP - General    Assessment:   This is a routine wellness examination for Denelle.   Hearing Screening   125Hz  250Hz  500Hz  1000Hz  2000Hz  3000Hz  4000Hz  6000Hz  8000Hz   Right ear:   40 40 40  40    Left ear:   40 40 40  40    Vision Screening Comments: Legally blind    Exercise Activities and Dietary recommendations Current Exercise Habits: The patient does not participate in regular exercise at present, Exercise limited by: orthopedic condition(s)  Goals    . Eat more fruits and vegetables     Starting 11/22/2017, I will attempt to eat 4-5 servings of fresh fruits and vegetables.        Fall Risk Fall Risk  11/22/2017 03/25/2016 11/17/2015  Falls in the past year? 0 No No    Depression Screen PHQ 2/9 Scores 11/22/2017 03/25/2016 11/17/2015  PHQ - 2 Score 2 0 0  PHQ- 9 Score 5 - -     Cognitive Function MMSE - Mini Mental State Exam 11/22/2017 03/25/2016  Orientation to time 5 5  Orientation to Place 5 5  Registration 3 3    Attention/ Calculation 0 0  Recall 3 3  Language- name 2 objects 0 0  Language- repeat 1 1  Language- follow 3 step command 3 0  Language- follow 3 step command-comments - this was not assessed due to pt is legally blind  Language- read & follow direction 0 0  Write a sentence 0 0  Copy design 0 0  Total score 20 17     PLEASE NOTE: A Mini-Cog screen was completed. Maximum score is 20. A value of 0 denotes this part of [place] MMSE was not completed or the patient failed this part of the Mini-Cog screening.   Mini-Cog Screening Orientation to Time - Max 5 pts Orientation to Place - Max 5 pts Registration - Max 3 pts Recall - Max 3 pts Language Repeat - Max 1 pts Language Follow 3 Step Command - Max 3 pts     Immunization History  Administered Date(s) Administered  . Influenza Split 01/01/2011  . Influenza,inj,Quad PF,6+ Mos 10/30/2014, 10/19/2017  . Influenza-Unspecified 10/27/2012, 10/21/2015, 10/19/2016  . Pneumococcal Polysaccharide-23 06/30/2011  . Td 07/28/2004  . Tdap 10/30/2014    Screening Tests Health Maintenance  Topic Date Due  . MAMMOGRAM  11/24/2017  . PAP SMEAR  08/19/2018 (Originally 08/26/2017)  . HIV Screening  02/20/2023 (Originally 07/13/1975)  . COLONOSCOPY  10/03/2019  . TETANUS/TDAP  10/29/2024  . INFLUENZA VACCINE  Completed  . Hepatitis C Screening  Completed      Plan:     I have personally reviewed, addressed, and noted the following in the patient's chart:  A. Medical and social history B. Use of alcohol, tobacco or illicit drugs  C. Current medications and supplements D. Functional ability and status E.  Nutritional status F.  Physical activity G. Advance directives H. List of other physicians I.  Hospitalizations, surgeries, and ER visits in previous 12 months J.  Vitals K. Screenings to include hearing, vision, cognitive, depression L. Referrals and appointments - none  In addition, I have reviewed and discussed with patient  certain preventive protocols, quality metrics, and best practice recommendations. A written personalized care plan for preventive services as well as general preventive health recommendations were provided to patient.  See attached scanned questionnaire for additional information.   Signed,   Randa Evens, MHA, BS, LPN Health Coach

## 2017-11-28 ENCOUNTER — Ambulatory Visit
Admission: RE | Admit: 2017-11-28 | Discharge: 2017-11-28 | Disposition: A | Discharge status: Home / Self Care | Payer: PPO | Source: Ambulatory Visit | Attending: Family Medicine | Admitting: Family Medicine

## 2017-11-28 DIAGNOSIS — Z1231 Encounter for screening mammogram for malignant neoplasm of breast: Secondary | ICD-10-CM | POA: Diagnosis not present

## 2017-11-29 ENCOUNTER — Ambulatory Visit (INDEPENDENT_AMBULATORY_CARE_PROVIDER_SITE_OTHER): Payer: PPO | Admitting: Family Medicine

## 2017-11-29 ENCOUNTER — Encounter: Payer: Self-pay | Admitting: Family Medicine

## 2017-11-29 VITALS — BP 126/80 | HR 78 | Temp 98.5°F | Ht 62.0 in | Wt 316.5 lb

## 2017-11-29 DIAGNOSIS — F341 Dysthymic disorder: Secondary | ICD-10-CM

## 2017-11-29 DIAGNOSIS — E7800 Pure hypercholesterolemia, unspecified: Secondary | ICD-10-CM | POA: Diagnosis not present

## 2017-11-29 DIAGNOSIS — R7309 Other abnormal glucose: Secondary | ICD-10-CM | POA: Diagnosis not present

## 2017-11-29 DIAGNOSIS — Z0000 Encounter for general adult medical examination without abnormal findings: Secondary | ICD-10-CM | POA: Diagnosis not present

## 2017-11-29 DIAGNOSIS — I10 Essential (primary) hypertension: Secondary | ICD-10-CM

## 2017-11-29 MED ORDER — MONTELUKAST SODIUM 10 MG PO TABS: ORAL_TABLET | ORAL | 3 refills | Status: DC

## 2017-11-29 MED ORDER — FLUTICASONE PROPIONATE 50 MCG/ACT NA SUSP: NASAL | 3 refills | Status: DC

## 2017-11-29 MED ORDER — SERTRALINE HCL 50 MG PO TABS: 50.0000 mg | ORAL_TABLET | Freq: Every day | ORAL | 3 refills | Status: DC

## 2017-11-29 MED ORDER — TOLTERODINE TARTRATE ER 4 MG PO CP24: 4.0000 mg | ORAL_CAPSULE | Freq: Every day | ORAL | 3 refills | Status: DC

## 2017-11-29 MED ORDER — OMEPRAZOLE 40 MG PO CPDR: DELAYED_RELEASE_CAPSULE | ORAL | 3 refills | Status: DC

## 2017-11-29 MED ORDER — LISINOPRIL 5 MG PO TABS: 5.0000 mg | ORAL_TABLET | Freq: Every morning | ORAL | 3 refills | Status: DC

## 2017-11-29 NOTE — Assessment & Plan Note (Signed)
bp in fair control at this time  BP Readings from Last 1 Encounters:  11/29/17 126/80   No changes needed Most recent labs reviewed  Disc lifstyle change with low sodium diet and exercise

## 2017-11-29 NOTE — Patient Instructions (Addendum)
If you are interested in the new shingles vaccine (Shingrix) - call your local pharmacy to check on coverage and availability  If affordable, get on a wait list at your pharmacy to get the vaccine.  Try to get back to more plant based eating (with lean protein)  Drink lots of water  Blood sugar is stable So is cholesterol   Take care of yourself

## 2017-11-29 NOTE — Assessment & Plan Note (Signed)
Disc goals for lipids and reasons to control them Rev last labs with pt Rev low sat fat diet in detail LDL in 130s- disc goals for control  Will work on diet

## 2017-11-29 NOTE — Assessment & Plan Note (Signed)
Despite big financial /family stressors - stable with sertraline Reviewed stressors/ coping techniques/symptoms/ support sources/ tx options and side effects in detail today

## 2017-11-29 NOTE — Progress Notes (Signed)
Subjective:    Patient ID: Monica Madden, female    DOB: 07-27-1960, 57 y.o.   MRN: [id number]  HPI  Here for health maintenance exam and to review chronic medical problems   Feeling sleepy -otherwise ok  Joints hurt/chronic and also back pain - stiff and worse in the am and when weather changes     Wt Readings from Last 3 Encounters:  11/29/17 (!) 316 lb 8 oz (143.6 kg)  11/22/17 (!) 308 lb 8 oz (139.9 kg)  11/09/17 (!) 308 lb 12 oz (140 kg)  wt is up  Emotional eating ? husb cooking more (was prev vegetarian) -- did better with that and plans to change (she knows what to eat)  Exercise limited- knee OA and chronic pain  Morbid obesity  57.89 kg/m   Had amw on 11/5 Scheduled her mammogram  Depression score 5 - discussed stressors at home (takes sertraline)   Mammogram 11/19 neg Self breast exam- no lumps   Colonoscopy 9/11 with 10 y recall   Zoster status - is interested in shingrix  Plans to get on a wait list   Pap 8/16 with gyn -neg with neg HPV  No menses -had ablation  No periods or bleeding   Legally blind (retinitis pigmentosa) She has eye appt upcoming   Overactive bladder- take meds when she has to go out  It can make her constipated   bp is stable today  No cp or palpitations or headaches or edema  No side effects to medicines  BP Readings from Last 3 Encounters:  11/29/17 126/80  11/22/17 122/70  11/09/17 122/76     Takes lisinopril   H/o elevated glucose Lab Results  Component Value Date   HGBA1C 5.6 11/22/2017  stable  Watching sugar/carbs - is better   Hyperlipidemia Lab Results  Component Value Date   CHOL 199 11/22/2017   CHOL 195 11/10/2016   CHOL 202 (H) 11/11/2015   Lab Results  Component Value Date   HDL 39.20 11/22/2017   HDL 48.60 11/10/2016   HDL 37.10 (L) 11/11/2015   Lab Results  Component Value Date   LDLCALC 124 (H) 11/10/2016   LDLCALC 126 (H) 10/23/2014   LDLCALC 118 (H) 08/02/2013   Lab Results    Component Value Date   TRIG 272.0 (H) 11/22/2017   TRIG 113.0 11/10/2016   TRIG 232.0 (H) 11/11/2015   Lab Results  Component Value Date   CHOLHDL 5 11/22/2017   CHOLHDL 4 11/10/2016   CHOLHDL 5 11/11/2015   Lab Results  Component Value Date   LDLDIRECT 135.0 11/22/2017   LDLDIRECT 139.0 11/11/2015   LDLDIRECT 147.0 07/01/2010  was fasting - trig up  Eating differently with husb cooking HDL down- less active because of knees  Other labs Glucose 105  Lab Results  Component Value Date   WBC 7.1 11/22/2017   HGB 13.1 11/22/2017   HCT 38.6 11/22/2017   MCV 87.6 11/22/2017   PLT 250.0 11/22/2017    Lab Results  Component Value Date   CREATININE 0.88 11/22/2017   BUN 22 11/22/2017   NA 140 11/22/2017   K 4.3 11/22/2017   CL 103 11/22/2017   CO2 29 11/22/2017   Lab Results  Component Value Date   ALT 18 11/22/2017   AST 12 11/22/2017   ALKPHOS 60 11/22/2017   BILITOT 0.5 11/22/2017    Lab Results  Component Value Date   TSH 1.76 11/22/2017   Anx/dep stable  A lot of stress Sertraline helps her weather it     Patient Active Problem List   Diagnosis Date Noted  . Encounter for chronic pain management 02/07/2017  . Encounter for pain management 08/05/2016  . Chronic pain 11/17/2015  . Elevated glucose level 11/09/2015  . Need for hepatitis C screening test 10/20/2014  . Routine general medical examination at a health care facility 06/30/2011  . Amenorrhea 06/30/2011  . Adverse effects of medication 07/01/2010  . DYSPEPSIA 09/26/2009  . IRRITABLE BOWEL SYNDROME 08/07/2009  . KNEE PAIN, BILATERAL 08/07/2009  . CONSTIPATION 01/15/2009  . LEG PAIN, LEFT 10/09/2008  . Hyperlipidemia 05/09/2008  . Essential hypertension 03/29/2008  . 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 06/05/2007  . INCI HERNIA WITHOUT MENTION OBSTRUCTION/GANGRENE 05/12/2007  . FREQUENCY, URINARY 05/12/2007  . ARTHRALGIA 11/03/2006  . DEPENDENT EDEMA, LEGS 11/03/2006  .  Morbid obesity (HCC) 07/15/2006  . ANXIETY DEPRESSION 07/15/2006  . MIGRAINE HEADACHE 07/15/2006  . RETINITIS PIGMENTOSA 07/15/2006  . VARICOSE VEINS, LOWER EXTREMITIES 07/15/2006  . ALLERGIC RHINITIS 07/15/2006  . ASTHMA 07/15/2006  . GERD 07/15/2006  . BACK PAIN, LUMBAR, CHRONIC 07/15/2006  . INCONTINENCE, URGE 07/15/2006   Past Medical History:  Diagnosis Date  . Allergy    allergic rhinitis  . Anemia   . Anxiety    ATTACKS  . Arthritis   . Asthma   . Cataract    left cataract removal 12/2008  . Chronic back pain    OA of spine  . Depression   . GERD (gastroesophageal reflux disease)    Endo negative 02/2000  . Hypertension 03/2008  . IBS (irritable bowel syndrome)   . MRSA infection 2008   Right leg  . Obesity   . Ovarian cyst   . Retinitis pigmentosa    blind   Past Surgical History:  Procedure Laterality Date  . APPENDECTOMY    . BLADDER SURGERY  1968   age 55 ? dilitation  . CATARACT EXTRACTION W/PHACO Right 11/11/2014   Procedure: CATARACT EXTRACTION PHACO AND INTRAOCULAR LENS PLACEMENT (IOC);  Surgeon: Sallee Lange, MD;  Location: [facility] ORS;  Service: Ophthalmology;  Laterality: Right;  LOT PACK: [id number] H [place]: 00:29.7 AP: 11.2 CDE: 7.14  . CESAREAN SECTION    . CHOLECYSTECTOMY    . CYSTOSCOPY  03/2008   negative// Dr. Wanda Plump urologist  . ENDOMETRIAL BIOPSY  07/1998   negative  . EYE SURGERY  12/2008   cataract removal left  . HERNIA REPAIR  05/2006   ruptured hernia repair after fall and then two more surgeries within as many weeks for complications(05/2006) Umbilical hernia repair (06/1997)  . KNEE ARTHROSCOPY W/ MENISCAL REPAIR     left knne   Social History   Tobacco Use  . Smoking status: Never Smoker  . Smokeless tobacco: Never Used  Substance Use Topics  . Alcohol use: Yes    Comment: twice a year  . Drug use: Yes    Types: Marijuana   Family History  Problem Relation Age of Onset  . Depression Mother   . Heart disease  Father   . Diabetes Maternal Uncle   . Hypertension Maternal Grandmother   . Depression Brother        Depression secondary to MVA injuries  . Hypertension Brother   . Heart disease Paternal Grandfather   . Heart disease Paternal Grandmother    Allergies  Allergen Reactions  . Diclofenac Sodium     REACTION: allergic/ made asthma bad  . Etodolac  REACTION: GI  . Metronidazole     REACTION: GI side eff  . Nsaids Other (See Comments)    GI Upset  . Oxybutynin Itching  . Sulfamethoxazole-Trimethoprim   . Tolterodine Tartrate     REACTION: None Effective  . Tramadol Other (See Comments)    Caused elevated BP  . Sulfa Antibiotics Itching and Rash   Current Outpatient Medications on File Prior to Visit  Medication Sig Dispense Refill  . acyclovir ointment (ZOVIRAX) 5 % Apply to fever blister 5 times daily for 4 days. 15 g 0  . albuterol (VENTOLIN HFA) 108 (90 BASE) MCG/ACT inhaler As needed for asthma attack     . ALPRAZolam (XANAX) 1 MG tablet take 1 tablet by mouth three times a day if needed 90 tablet 3  . Ascorbic Acid (VITAMIN C) 1000 MG tablet Take 1,000 mg by mouth daily.      . Calcium Carbonate (CALCIUM 600) 1500 MG TABS Take as directed.     . Coenzyme Q10 (CO Q 10 PO) Take 1 tablet by mouth daily.    . fexofenadine (ALLERGY RELIEF) 180 MG tablet TAKE ONE (1) TABLET BY MOUTH EVERY DAY 90 tablet 3  . HYDROcodone-acetaminophen (NORCO) 5-325 MG tablet Take 1 tablet by mouth every 6 (six) hours as needed for moderate pain or severe pain. Do not fill before 12/09/17 90 tablet 0  . HYDROcodone-acetaminophen (NORCO) 5-325 MG tablet Take 1 tablet by mouth every 6 (six) hours as needed for moderate pain or severe pain. Do not fill before 01/08/18 90 tablet 0  . HYDROcodone-acetaminophen (NORCO/VICODIN) 5-325 MG tablet Take 1 tablet by mouth every 6 (six) hours as needed. Do not fill until 11/08/17 90 tablet 0  . Omega 3-6-9 Fatty Acids (OMEGA 3-6-9 COMPLEX PO) Take one tablet by  mouth daily     No current facility-administered medications on file prior to visit.     Review of Systems  Constitutional: Negative for activity change, appetite change, fatigue, fever and unexpected weight change.  HENT: Negative for congestion, ear pain, rhinorrhea, sinus pressure and sore throat.   Eyes: Positive for visual disturbance. Negative for pain and redness.  Respiratory: Negative for cough, shortness of breath and wheezing.   Cardiovascular: Negative for chest pain and palpitations.  Gastrointestinal: Negative for abdominal pain, blood in stool, constipation and diarrhea.  Endocrine: Negative for polydipsia and polyuria.  Genitourinary: Negative for dysuria, frequency and urgency.  Musculoskeletal: Positive for arthralgias, back pain and gait problem. Negative for myalgias.  Skin: Negative for pallor and rash.  Allergic/Immunologic: Negative for environmental allergies.  Neurological: Negative for dizziness, syncope and headaches.  Hematological: Negative for adenopathy. Does not bruise/bleed easily.  Psychiatric/Behavioral: Negative for decreased concentration and dysphoric mood. The patient is not nervous/anxious.        Objective:   Physical Exam  Constitutional: She appears well-developed and well-nourished. No distress.  Morbidly obese female examined sitting in chair  HENT:  Head: Normocephalic and atraumatic.  Right Ear: External ear normal.  Left Ear: External ear normal.  Mouth/Throat: Oropharynx is clear and moist.  Eyes: Conjunctivae and EOM are normal. Right eye exhibits no discharge. Left eye exhibits no discharge. No scleral icterus.  Blind   Neck: Normal range of motion. Neck supple. No JVD present. Carotid bruit is not present. No thyromegaly present.  Cardiovascular: Normal rate, regular rhythm, normal heart sounds and intact distal pulses. Exam reveals no gallop.  Pulmonary/Chest: Effort normal and breath sounds normal. No respiratory distress. She  has no wheezes. She has no rales. She exhibits no tenderness. No breast tenderness, discharge or bleeding.  Abdominal: Soft. Bowel sounds are normal. She exhibits no distension, no abdominal bruit and no mass. There is no tenderness.  Genitourinary: No breast tenderness, discharge or bleeding.  Genitourinary Comments: Breast exam: No mass, nodules, thickening, tenderness, bulging, retraction, inflamation, nipple discharge or skin changes noted.  No axillary or clavicular LA.      Musculoskeletal: Normal range of motion. She exhibits no edema or tenderness.  Lymphadenopathy:    She has no cervical adenopathy.  Neurological: She is alert. She has normal reflexes. She displays normal reflexes. No cranial nerve deficit. She exhibits normal muscle tone. Coordination normal.  Skin: Skin is warm and dry. No rash noted. No erythema. No pallor.  Fair complexion  Few skin tags on neck  Solar lentigines diffusely   Psychiatric: She has a normal mood and affect.  Pleasant and talkative           Assessment & Plan:

## 2017-11-29 NOTE — Assessment & Plan Note (Signed)
Discussed how this problem influences overall health and the risks it imposes  Reviewed plan for weight loss with lower calorie diet (via better food choices and also portion control or program like weight watchers) and exercise building up to or more than 30 minutes 5 days per week including some aerobic activity   Wt gain recent  Disc emot eating Also started eating more meat-did better with plant based diet and will return to that  Exercise is difficult due to knee oA-hopes to get a knee replacement

## 2017-11-29 NOTE — Assessment & Plan Note (Signed)
Lab Results  Component Value Date   HGBA1C 5.6 11/22/2017   Stable  disc imp of low glycemic diet and wt loss to prevent DM2

## 2017-11-29 NOTE — Assessment & Plan Note (Signed)
Reviewed health habits including diet and exercise and skin cancer prevention Reviewed appropriate screening tests for age  Also reviewed health mt list, fam hx and immunization status , as well as social and family history   See HPI  Labs rev amw rev No gaps  Enc wt loss  Disc getting on wait list for shingrix vaccine

## 2017-12-13 DIAGNOSIS — Z961 Presence of intraocular lens: Secondary | ICD-10-CM | POA: Diagnosis not present

## 2017-12-17 ENCOUNTER — Other Ambulatory Visit: Payer: Self-pay | Admitting: Family Medicine

## 2017-12-19 NOTE — Telephone Encounter (Signed)
Name of Medication: xanax Name of Pharmacy: [facility] [address] Last Fill or Written Date and Quantity: 08/18/17 #90 with 3 refills Last Office Visit and Type: CPE 11/29/17 Next Office Visit and Type: 02/03/17 Stop Act appt Last Controlled Substance Agreement Date: 02/07/17 Last UDS:02/07/17

## 2018-01-04 DIAGNOSIS — M1712 Unilateral primary osteoarthritis, left knee: Secondary | ICD-10-CM | POA: Diagnosis not present

## 2018-01-04 DIAGNOSIS — M7581 Other shoulder lesions, right shoulder: Secondary | ICD-10-CM | POA: Diagnosis not present

## 2018-02-02 ENCOUNTER — Telehealth: Payer: Self-pay | Admitting: Family Medicine

## 2018-02-02 NOTE — Telephone Encounter (Signed)
Pt states that by rescheduling appt she might be outside timeframe for pain management requirements. The first available appt I could get her was 02/09/18. Will this appt be ok?

## 2018-02-02 NOTE — Telephone Encounter (Signed)
That is fine- she can call when it is time for a refill so she does not run out  Thanks

## 2018-02-03 ENCOUNTER — Ambulatory Visit: Payer: PPO | Admitting: Family Medicine

## 2018-02-03 NOTE — Telephone Encounter (Signed)
Pt advised of Dr. Royden Purl comments she said she will not run out before appt she was just worried about the STOP act and she knows that it would be a little over 3 months but I did tell pt that it's okay and it will not affect her med

## 2018-02-07 ENCOUNTER — Other Ambulatory Visit: Payer: Self-pay | Admitting: Family Medicine

## 2018-02-09 ENCOUNTER — Encounter: Payer: Self-pay | Admitting: Family Medicine

## 2018-02-09 ENCOUNTER — Encounter: Payer: Self-pay | Admitting: *Deleted

## 2018-02-09 ENCOUNTER — Ambulatory Visit (INDEPENDENT_AMBULATORY_CARE_PROVIDER_SITE_OTHER): Payer: PPO | Admitting: Family Medicine

## 2018-02-09 VITALS — BP 128/74 | HR 67 | Temp 98.2°F | Ht 62.0 in | Wt 304.2 lb

## 2018-02-09 DIAGNOSIS — M25561 Pain in right knee: Secondary | ICD-10-CM

## 2018-02-09 DIAGNOSIS — M25562 Pain in left knee: Secondary | ICD-10-CM

## 2018-02-09 DIAGNOSIS — G8929 Other chronic pain: Secondary | ICD-10-CM | POA: Diagnosis not present

## 2018-02-09 MED ORDER — HYDROCODONE-ACETAMINOPHEN 5-325 MG PO TABS: 1.0000 | ORAL_TABLET | Freq: Four times a day (QID) | ORAL | 0 refills | Status: DC | PRN

## 2018-02-09 NOTE — Patient Instructions (Signed)
Great job with weight loss   Take care of yourself  I wrote all 3 of your next pain px and sent electronically

## 2018-02-09 NOTE — Assessment & Plan Note (Signed)
Chronic knee pain/severe OA  norco 5-325   #90 per mo  Rev the state data base -no concerns  Px electronically three  Month px post dated  End further wt loss UDS today as well as contract review  F/u 3 mo

## 2018-02-09 NOTE — Assessment & Plan Note (Signed)
Chronic pain with OA and meniscal injury  Enc for chronic pain management done today  Recent inj of L knee -helpful  Planning for eventual TKA

## 2018-02-09 NOTE — Progress Notes (Signed)
Subjective:    Patient ID: Monica Madden, female    DOB: 10-16-1960, 58 y.o.   MRN: [id number]  HPI Here for encounter for chronic pain management    Indication for chronic opioid: OA of knees with meniscal injury Is mobility impaired Pain continues unchanged (worse in general in cold weather)  Money issues keep her from getting a knee replacement (will start with the L) Wants to loose weight for this   Medication and dose: norco 5-325 mg 1 tab up to every 6 hours prn pain  No changes in the way she takes it  Tries to take only when needed  Doing the best she can   # pills per month: 90 Last fill was 01/09/18 for 90 pills   Last UDS date: 02/07/17  (due for today)  Opioid Treatment Agreement signed (Y/N): yes   Opioid Treatment Agreement last reviewed with patient:  today   N[facility] reviewed this encounter (include red flags):  today 02/09/18 no red flags or concerns   Wt Readings from Last 3 Encounters:  02/09/18 (!) 304 lb 4 oz (138 kg)  11/29/17 (!) 316 lb 8 oz (143.6 kg)  11/22/17 (!) 308 lb 8 oz (139.9 kg)  lost weight! -really happy  Eating better /trying hard  (did eat poorly over xmas)  Salads and avacados instead of snacks and chips  55.65 kg/m   Had injections recently in knee and shoulder with Dr Floyce Stakes  Helping a little bit  Was given flexeril for spasms in shoulder area as well /rarely uses   Patient Active Problem List   Diagnosis Date Noted  . Encounter for chronic pain management 02/07/2017  . Encounter for pain management 08/05/2016  . Chronic pain 11/17/2015  . Elevated glucose level 11/09/2015  . Need for hepatitis C screening test 10/20/2014  . Routine general medical examination at a health care facility 06/30/2011  . Amenorrhea 06/30/2011  . Adverse effects of medication 07/01/2010  . DYSPEPSIA 09/26/2009  . IRRITABLE BOWEL SYNDROME 08/07/2009  . KNEE PAIN, BILATERAL 08/07/2009  . CONSTIPATION 01/15/2009  . LEG PAIN, LEFT 10/09/2008  .  Hyperlipidemia 05/09/2008  . Essential hypertension 03/29/2008  . 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 06/05/2007  . INCI HERNIA WITHOUT MENTION OBSTRUCTION/GANGRENE 05/12/2007  . FREQUENCY, URINARY 05/12/2007  . ARTHRALGIA 11/03/2006  . DEPENDENT EDEMA, LEGS 11/03/2006  . Morbid obesity (HCC) 07/15/2006  . ANXIETY DEPRESSION 07/15/2006  . MIGRAINE HEADACHE 07/15/2006  . RETINITIS PIGMENTOSA 07/15/2006  . VARICOSE VEINS, LOWER EXTREMITIES 07/15/2006  . ALLERGIC RHINITIS 07/15/2006  . ASTHMA 07/15/2006  . GERD 07/15/2006  . BACK PAIN, LUMBAR, CHRONIC 07/15/2006  . INCONTINENCE, URGE 07/15/2006   Past Medical History:  Diagnosis Date  . Allergy    allergic rhinitis  . Anemia   . Anxiety    ATTACKS  . Arthritis   . Asthma   . Cataract    left cataract removal 12/2008  . Chronic back pain    OA of spine  . Depression   . GERD (gastroesophageal reflux disease)    Endo negative 02/2000  . Hypertension 03/2008  . IBS (irritable bowel syndrome)   . MRSA infection 2008   Right leg  . Obesity   . Ovarian cyst   . Retinitis pigmentosa    blind   Past Surgical History:  Procedure Laterality Date  . APPENDECTOMY    . BLADDER SURGERY  1968   age 56 ? dilitation  . CATARACT EXTRACTION W/PHACO Right  11/11/2014   Procedure: CATARACT EXTRACTION PHACO AND INTRAOCULAR LENS PLACEMENT (IOC);  Surgeon: Sallee LangeSteven Dingeldein, MD;  Location: [facility] OR[facility];  Service: Ophthalmology;  Laterality: Right;  LOT PACK: [id number] H [place]: 00:29.7 AP: 11.2 CDE: 7.14  . CESAREAN SECTION    . CHOLECYSTECTOMY    . CYSTOSCOPY  03/2008   negative// Dr. Wanda PlumpHumphries urologist  . ENDOMETRIAL BIOPSY  07/1998   negative  . EYE SURGERY  12/2008   cataract removal left  . HERNIA REPAIR  05/2006   ruptured hernia repair after fall and then two more surgeries within as many weeks for complications(05/2006) Umbilical hernia repair (06/1997)  . KNEE ARTHROSCOPY W/ MENISCAL REPAIR     left knne   Social  History   Tobacco Use  . Smoking status: Never Smoker  . Smokeless tobacco: Never Used  Substance Use Topics  . Alcohol use: Yes    Comment: twice a year  . Drug use: Yes    Types: Marijuana   Family History  Problem Relation Age of Onset  . Depression Mother   . Heart disease Father   . Diabetes Maternal Uncle   . Hypertension Maternal Grandmother   . Depression Brother        Depression secondary to MVA injuries  . Hypertension Brother   . Heart disease Paternal Grandfather   . Heart disease Paternal Grandmother    Allergies  Allergen Reactions  . Diclofenac Sodium     REACTION: allergic/ made asthma bad  . Etodolac     REACTION: GI  . Metronidazole     REACTION: GI side eff  . Nsaids Other (See Comments)    GI Upset  . Oxybutynin Itching  . Sulfamethoxazole-Trimethoprim   . Tolterodine Tartrate     REACTION: None Effective  . Tramadol Other (See Comments)    Caused elevated BP  . Sulfa Antibiotics Itching and Rash   Current Outpatient Medications on File Prior to Visit  Medication Sig Dispense Refill  . acyclovir ointment (ZOVIRAX) 5 % Apply to fever blister 5 times daily for 4 days. 15 g 0  . albuterol (VENTOLIN HFA) 108 (90 BASE) MCG/ACT inhaler As needed for asthma attack     . ALPRAZolam (XANAX) 1 MG tablet TAKE 1 TABLET BY MOUTH THREE TIMES DAILY AS NEEDED 90 tablet 1  . Ascorbic Acid (VITAMIN C) 1000 MG tablet Take 1,000 mg by mouth daily.      . Calcium Carbonate (CALCIUM 600) 1500 MG TABS Take as directed.     . Coenzyme Q10 (CO Q 10 PO) Take 1 tablet by mouth daily.    . cyclobenzaprine (FLEXERIL) 10 MG tablet Take 10 mg by mouth 3 (three) times daily as needed for muscle spasms.    . fexofenadine (ALLERGY RELIEF) 180 MG tablet TAKE ONE (1) TABLET BY MOUTH EVERY DAY 90 tablet 3  . fluticasone (FLONASE) 50 MCG/ACT nasal spray INSTILL 2 SPRAYS INTO EACH NOSTRIL TWICE A DAY 48 g 3  . lisinopril (PRINIVIL,ZESTRIL) 5 MG tablet Take 1 tablet (5 mg total) by  mouth every morning. 90 tablet 3  . montelukast (SINGULAIR) 10 MG tablet take 1 tablet by mouth once daily 90 tablet 3  . Omega 3-6-9 Fatty Acids (OMEGA 3-6-9 COMPLEX PO) Take one tablet by mouth daily    . omeprazole (PRILOSEC) 40 MG capsule take 1 capsule by mouth once daily 90 capsule 3  . sertraline (ZOLOFT) 50 MG tablet Take 1 tablet (50 mg total) by mouth daily. 90 tablet 3  .  tolterodine (DETROL LA) 4 MG 24 hr capsule Take 1 capsule (4 mg total) by mouth daily. 90 capsule 3   No current facility-administered medications on file prior to visit.     Review of Systems  Constitutional: Negative for activity change, appetite change, fatigue, fever and unexpected weight change.  HENT: Negative for congestion, ear pain, rhinorrhea, sinus pressure and sore throat.   Eyes: Positive for visual disturbance. Negative for pain and redness.  Respiratory: Negative for cough, shortness of breath and wheezing.   Cardiovascular: Negative for chest pain and palpitations.  Gastrointestinal: Negative for abdominal pain, blood in stool, constipation and diarrhea.  Endocrine: Negative for polydipsia and polyuria.  Genitourinary: Negative for dysuria, frequency and urgency.  Musculoskeletal: Positive for arthralgias, back pain and gait problem. Negative for joint swelling and myalgias.       Knee and shoulder and back pain   Skin: Negative for pallor and rash.  Allergic/Immunologic: Negative for environmental allergies.  Neurological: Negative for dizziness, syncope and headaches.  Hematological: Negative for adenopathy. Does not bruise/bleed easily.  Psychiatric/Behavioral: Negative for decreased concentration and dysphoric mood. The patient is not nervous/anxious.        Objective:   Physical Exam Constitutional:      General: She is not in acute distress.    Appearance: Normal appearance. She is obese. She is not ill-appearing.  HENT:     Head: Normocephalic and atraumatic.     Mouth/Throat:      Mouth: Mucous membranes are moist.     Pharynx: Oropharynx is clear.  Eyes:     Comments: Vision impaired   Neck:     Musculoskeletal: Normal range of motion.  Cardiovascular:     Rate and Rhythm: Normal rate and regular rhythm.     Pulses: Normal pulses.     Heart sounds: Normal heart sounds.  Pulmonary:     Effort: Pulmonary effort is normal. No respiratory distress.     Breath sounds: Normal breath sounds. No wheezing or rales.  Lymphadenopathy:     Cervical: No cervical adenopathy.  Skin:    General: Skin is warm and dry.     Capillary Refill: Capillary refill takes less than 2 seconds.  Neurological:     General: No focal deficit present.     Mental Status: She is alert.  Psychiatric:        Mood and Affect: Mood normal.           Assessment & Plan:   Problem List Items Addressed This Visit      Other   Morbid obesity (HCC)    Discussed how this problem influences overall health and the risks it imposes  Reviewed plan for weight loss with lower calorie diet (via better food choices and also portion control or program like weight watchers) and exercise building up to or more than 30 minutes 5 days per week including some aerobic activity   Commended on wt loss so far! Plans to keep it up       KNEE PAIN, BILATERAL    Chronic pain with OA and meniscal injury  Enc for chronic pain management done today  Recent inj of L knee -helpful  Planning for eventual TKA      Relevant Orders   Pain Mgmt, Profile 8 w/Conf, U   Encounter for chronic pain management - Primary    Chronic knee pain/severe OA  norco 5-325   #90 per mo  Rev the state data base -no concerns  Px electronically three  Month px post dated  End further wt loss UDS today as well as contract review  F/u 3 mo       Relevant Orders   Pain Mgmt, Profile 8 w/Conf, U

## 2018-02-09 NOTE — Assessment & Plan Note (Signed)
Discussed how this problem influences overall health and the risks it imposes  Reviewed plan for weight loss with lower calorie diet (via better food choices and also portion control or program like weight watchers) and exercise building up to or more than 30 minutes 5 days per week including some aerobic activity   Commended on wt loss so far! Plans to keep it up

## 2018-02-12 LAB — PAIN MGMT, PROFILE 8 W/CONF, U
6 Acetylmorphine: NEGATIVE ng/mL (ref <10)
Alcohol Metabolites: NEGATIVE ng/mL (ref <500)
Alphahydroxyalprazolam: 979 ng/mL — ABNORMAL HIGH (ref <25)
Alphahydroxymidazolam: NEGATIVE ng/mL (ref <50)
Alphahydroxytriazolam: NEGATIVE ng/mL (ref <50)
Aminoclonazepam: NEGATIVE ng/mL (ref <25)
Amphetamines: NEGATIVE ng/mL (ref <500)
BUPRENORPHINE, URINE: NEGATIVE ng/mL (ref <5)
Benzodiazepines: POSITIVE ng/mL — AB (ref <100)
Cocaine Metabolite: NEGATIVE ng/mL (ref <150)
Codeine: NEGATIVE ng/mL (ref <50)
Creatinine: 219.5 mg/dL
Hydrocodone: 1917 ng/mL — ABNORMAL HIGH (ref <50)
Hydromorphone: 247 ng/mL — ABNORMAL HIGH (ref <50)
Hydroxyethylflurazepam: NEGATIVE ng/mL (ref <50)
Lorazepam: NEGATIVE ng/mL (ref <50)
MDMA: NEGATIVE ng/mL (ref <500)
Marijuana Metabolite: 16 ng/mL — ABNORMAL HIGH (ref <5)
Marijuana Metabolite: POSITIVE ng/mL — AB (ref <20)
Morphine: NEGATIVE ng/mL (ref <50)
Nordiazepam: NEGATIVE ng/mL (ref <50)
Norhydrocodone: 1976 ng/mL — ABNORMAL HIGH (ref <50)
Opiates: POSITIVE ng/mL — AB (ref <100)
Oxazepam: NEGATIVE ng/mL (ref <50)
Oxidant: NEGATIVE ug/mL (ref <200)
Oxycodone: NEGATIVE ng/mL (ref <100)
Temazepam: NEGATIVE ng/mL (ref <50)
pH: 7.52 (ref 4.5–9.0)

## 2018-02-14 IMAGING — MG 2D DIGITAL SCREENING BILATERAL MAMMOGRAM WITH CAD AND ADJUNCT TO
8 of 15 series · 8 of 31 positions shown · non-contrast
Comparison: Previous exam(s).

CLINICAL DATA: Screening.

EXAM:
2D DIGITAL SCREENING BILATERAL MAMMOGRAM WITH CAD AND ADJUNCT TOMO

[L CC (1 of 2)]
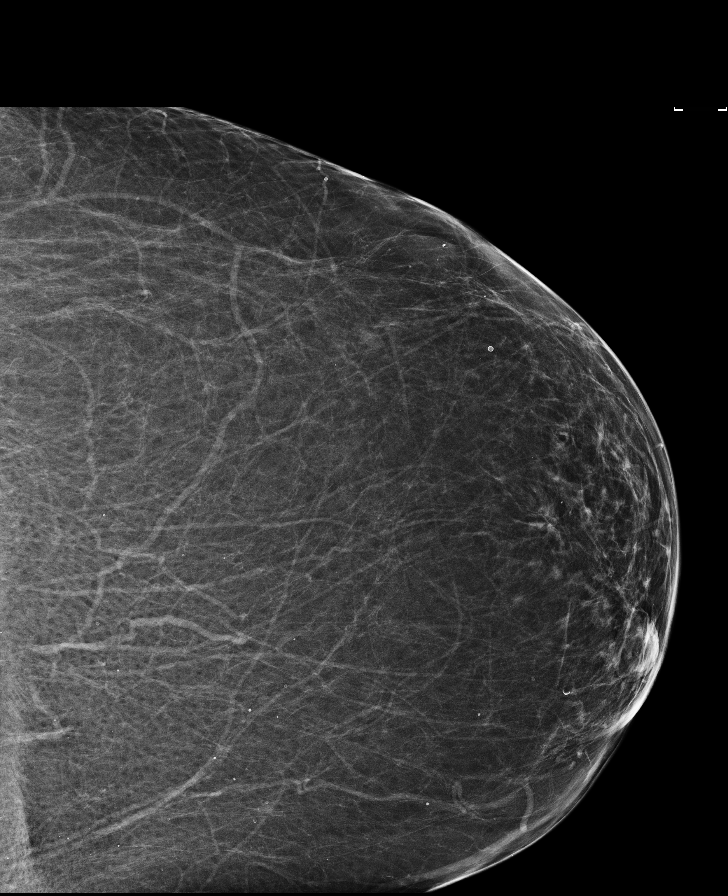

[R CC (1 of 2)]
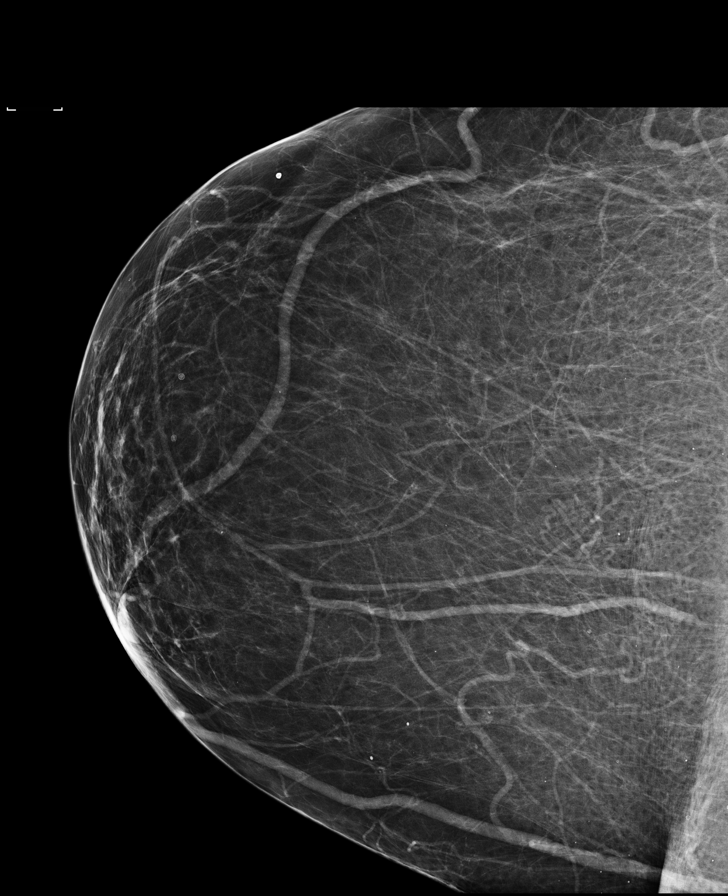

[L MLO]
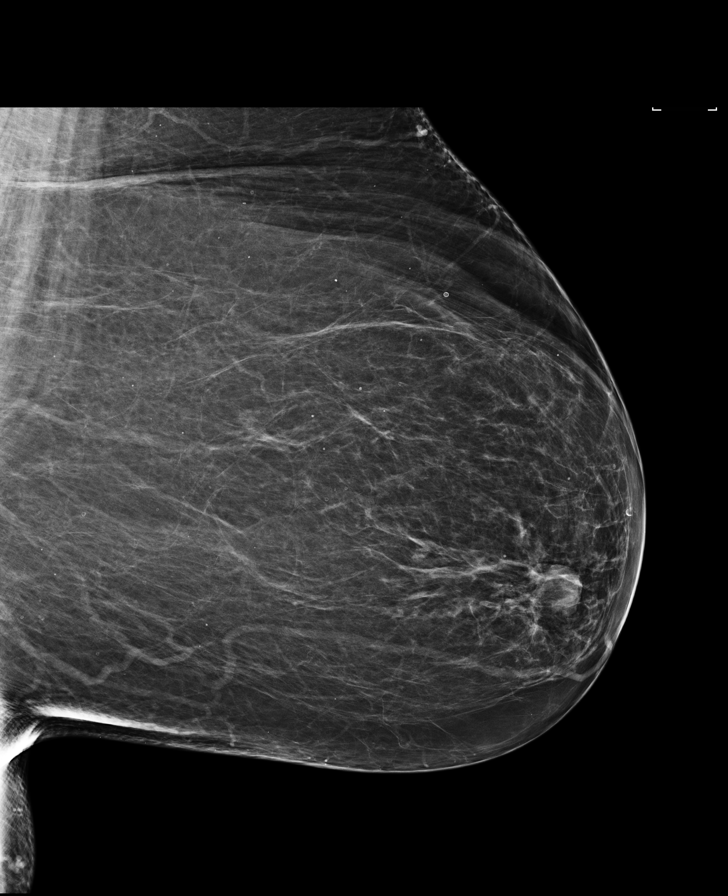

[R CC (2 of 2)]
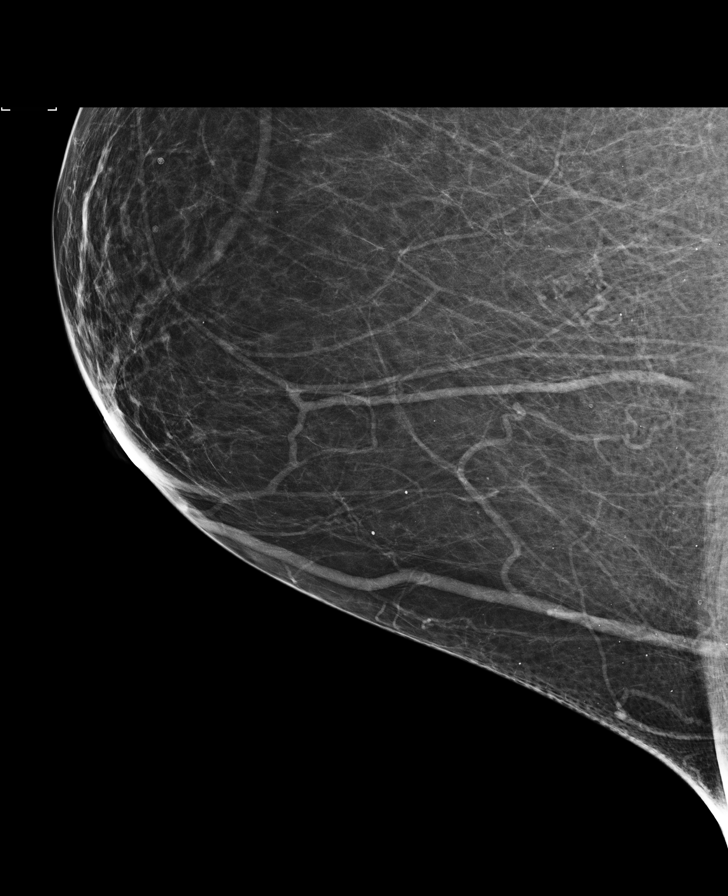

[L CC (2 of 2)]
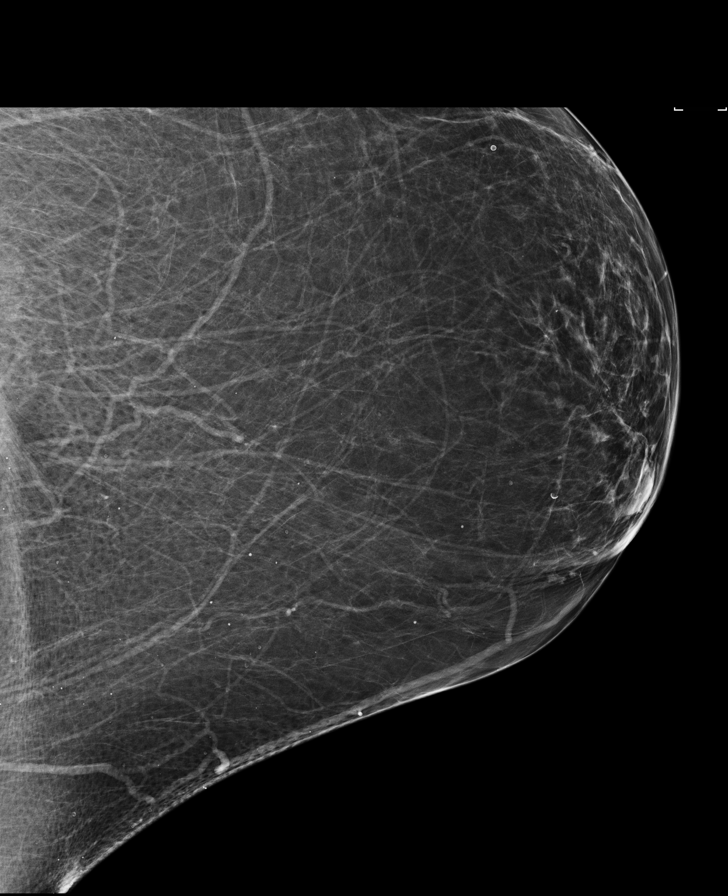

[R MLO synth-2D]
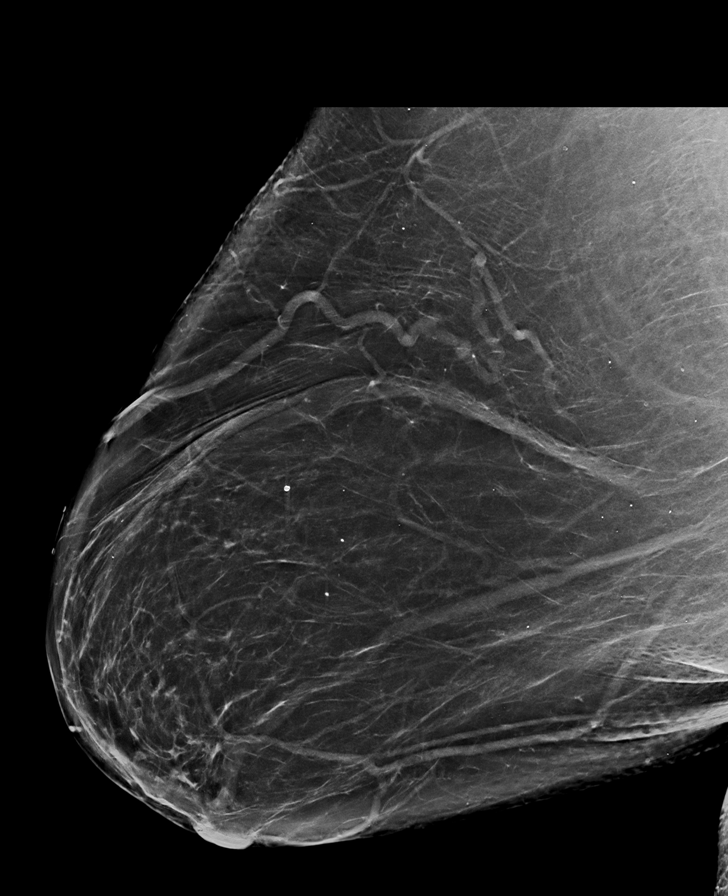

[L MLO synth-2D]
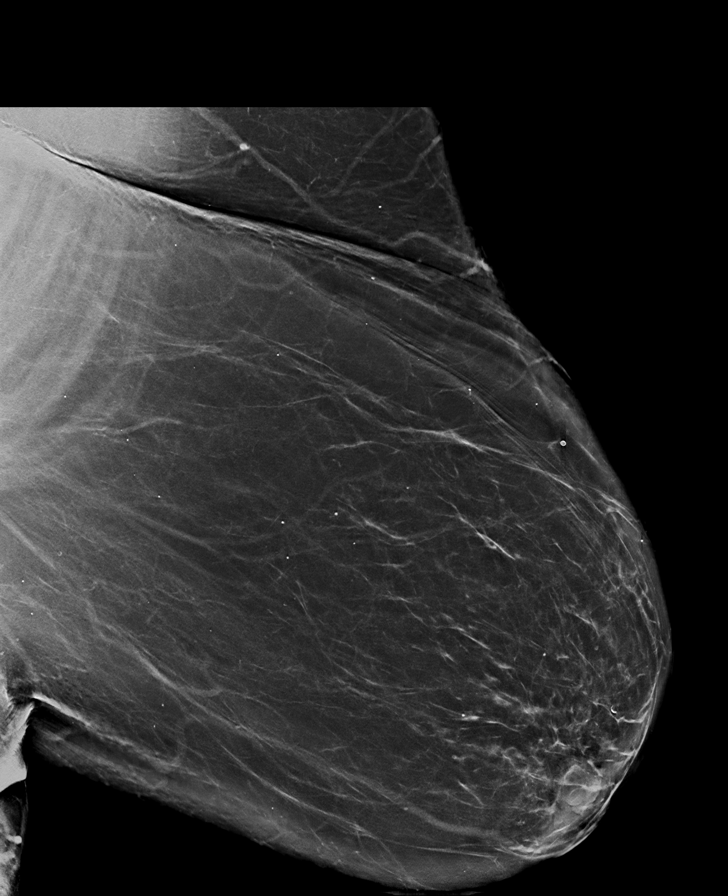

[L CC synth-2D]
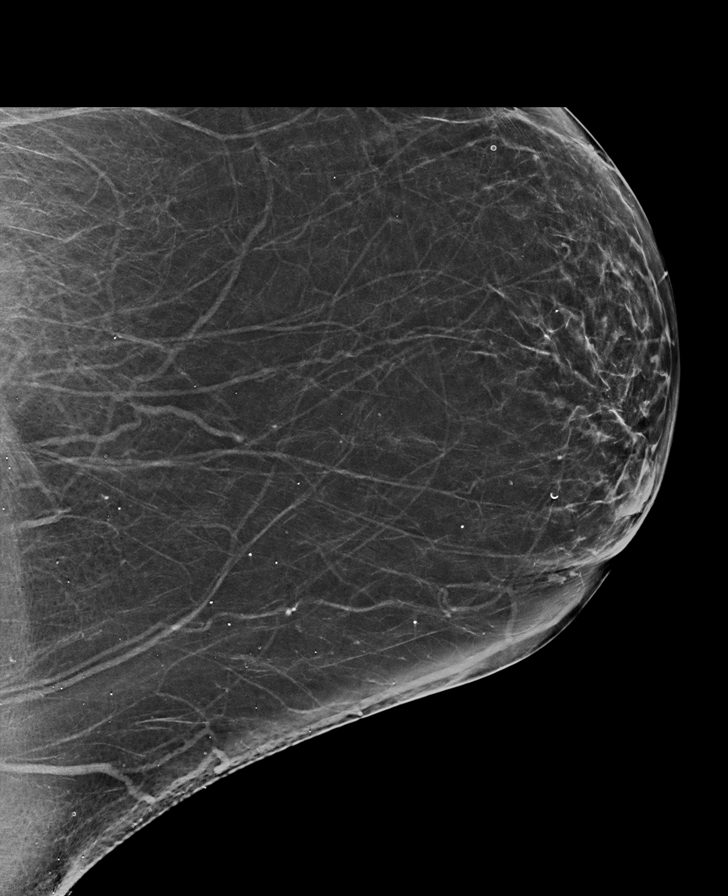

[8 of 31 positions shown; findings below may reference images not displayed]

ACR Breast Density Category b: There are scattered areas of
fibroglandular density.
FINDINGS: There are no findings suspicious for malignancy. Images were
processed with CAD.
IMPRESSION: No mammographic evidence of malignancy. A result letter of this
screening mammogram will be mailed directly to the patient.

RECOMMENDATION:
Screening mammogram in one year. (Code:97-6-RS4)

BI-RADS CATEGORY  1: Negative.

## 2018-02-16 ENCOUNTER — Other Ambulatory Visit: Payer: Self-pay | Admitting: Family Medicine

## 2018-02-17 NOTE — Telephone Encounter (Signed)
Name of Medication: Alprazolam Name of Pharmacy: [facility]  Last Fill or Written Date and Quantity: 12/19/2017 for 90 tablets with 1 refill. Last Office Visit and Type: 02/09/2018 for chronic pain management Next Office Visit and Type: 05/05/2018 for follow up Last Controlled Substance Agreement Date: 02/07/2017 Last UDS: 02/09/2018

## 2018-04-26 ENCOUNTER — Telehealth: Payer: Self-pay | Admitting: Family Medicine

## 2018-04-26 NOTE — Telephone Encounter (Signed)
I left a message on patient's voice mail to call back.  Patient needs to change her f/u appointment on 05/05/18 to [email or web address].

## 2018-05-05 ENCOUNTER — Ambulatory Visit (INDEPENDENT_AMBULATORY_CARE_PROVIDER_SITE_OTHER): Payer: PPO | Admitting: Family Medicine

## 2018-05-05 ENCOUNTER — Encounter: Payer: Self-pay | Admitting: Family Medicine

## 2018-05-05 DIAGNOSIS — F341 Dysthymic disorder: Secondary | ICD-10-CM

## 2018-05-05 DIAGNOSIS — G8929 Other chronic pain: Secondary | ICD-10-CM

## 2018-05-05 DIAGNOSIS — R52 Pain, unspecified: Secondary | ICD-10-CM | POA: Diagnosis not present

## 2018-05-05 MED ORDER — HYDROCODONE-ACETAMINOPHEN 5-325 MG PO TABS: 1.0000 | ORAL_TABLET | Freq: Four times a day (QID) | ORAL | 0 refills | Status: DC | PRN

## 2018-05-05 NOTE — Assessment & Plan Note (Signed)
Knees primarily- OA and meniscal tears  On norco #90 per mo w/o change Last refill 3/25  Post dated 3 px to fill on or after 4/25, 5/25, 07/13/18  Rev NCCS-no red flags Pain control is fair Good coping techniques  utd with contract rev and UDS Will f/u 3 mo for next pain management encounter

## 2018-05-05 NOTE — Assessment & Plan Note (Signed)
Pt continues zoloft with good control overall  Had a hard time 2 weeks ago with initial pandemic stay at home order/but improved now  Positive outlook  Good support and talks on phone daily with her daughter  [place] self care and outdoor time when able  Continue to update

## 2018-05-05 NOTE — Progress Notes (Signed)
Virtual Visit via Video Note  I connected with Monica Madden on 05/05/18 at 10:45 AM EDT by a video enabled telemedicine application and verified that I am speaking with the correct person using two identifiers. She is at home today  I am in my office Did visit by phone since she is legally blind and cannot see a screen to make it work    I discussed the limitations of evaluation and management by telemedicine and the availability of in person appointments. The patient expressed understanding and agreed to proceed.  History of Present Illness: Here for 3 mo visit for encounter /chronic pain med (STOP act) Holding up ok  First few weeks were rough mentally but the zoloft helps a lot  Had to cancel a trip and unable to see her daughter  Husband is home- they keep their own schedules Talks to her daughter on the phone  A lot of friends with very good support   She has stayed away from etoh as a coping mechanism Sleep is up and down-not great  Staying at home since the covid pandemic   Indication for chronic opioid: OA of knees as well as meniscal injury causing chronic pain and impaired mobility  She was scheduled to get shots on 3/17 (had to re schedule due to the pandemic)  Her legs and knees are hurting worse (also shoulder issues)  She is still trying to stay as active as she can   Pollen is bad - a little tough to get outside   Medication and dose: norco 5-325 mg 1 tab up to every 6 hours prn pain  Pain is worse-she is taking the same  Still helping   # pills per month: 90 Her last refill was 04/12/18 for 90 pills  Has 23 pills left   Using a cane   Legally blind-this frustrates her   Last UDS date: 02/09/18   Opioid Treatment Agreement signed (Y/N): yes   Opioid Treatment Agreement last reviewed with patient:  02/09/18  NCCSRS reviewed this encounter (include red flags):  today 05/05/18 with no red flags   Dr Floyce Stakes is her treating orthopedic doctor She does  injections /knee (and shoulder)    Review of Systems  Constitutional: Negative for chills, fever, malaise/fatigue and weight loss.  Eyes: Positive for blurred vision.       Legally blind  Respiratory: Negative for cough and shortness of breath.   Cardiovascular: Negative for chest pain and palpitations.  Gastrointestinal: Negative for abdominal pain.  Musculoskeletal: Positive for back pain, joint pain and myalgias. Negative for falls.  Skin: Negative for itching and rash.  Neurological: Negative for dizziness and headaches.  Endo/Heme/Allergies: Positive for environmental allergies.  Psychiatric/Behavioral: Positive for depression. The patient is nervous/anxious.      Patient Active Problem List   Diagnosis Date Noted  . Encounter for chronic pain management 02/07/2017  . Encounter for pain management 08/05/2016  . Chronic pain 11/17/2015  . Elevated glucose level 11/09/2015  . Need for hepatitis C screening test 10/20/2014  . Routine general medical examination at a health care facility 06/30/2011  . Amenorrhea 06/30/2011  . Adverse effects of medication 07/01/2010  . DYSPEPSIA 09/26/2009  . IRRITABLE BOWEL SYNDROME 08/07/2009  . KNEE PAIN, BILATERAL 08/07/2009  . CONSTIPATION 01/15/2009  . LEG PAIN, LEFT 10/09/2008  . Hyperlipidemia 05/09/2008  . Essential hypertension 03/29/2008  . 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 06/05/2007  . INCI HERNIA WITHOUT MENTION OBSTRUCTION/GANGRENE 05/12/2007  .  FREQUENCY, URINARY 05/12/2007  . ARTHRALGIA 11/03/2006  . DEPENDENT EDEMA, LEGS 11/03/2006  . Morbid obesity (HCC) 07/15/2006  . ANXIETY DEPRESSION 07/15/2006  . MIGRAINE HEADACHE 07/15/2006  . RETINITIS PIGMENTOSA 07/15/2006  . VARICOSE VEINS, LOWER EXTREMITIES 07/15/2006  . ALLERGIC RHINITIS 07/15/2006  . ASTHMA 07/15/2006  . GERD 07/15/2006  . BACK PAIN, LUMBAR, CHRONIC 07/15/2006  . INCONTINENCE, URGE 07/15/2006   Past Medical History:  Diagnosis Date  .  Allergy    allergic rhinitis  . Anemia   . Anxiety    ATTACKS  . Arthritis   . Asthma   . Cataract    left cataract removal 12/2008  . Chronic back pain    OA of spine  . Depression   . GERD (gastroesophageal reflux disease)    Endo negative 02/2000  . Hypertension 03/2008  . IBS (irritable bowel syndrome)   . MRSA infection 2008   Right leg  . Obesity   . Ovarian cyst   . Retinitis pigmentosa    blind   Past Surgical History:  Procedure Laterality Date  . APPENDECTOMY    . BLADDER SURGERY  1968   age 72 ? dilitation  . CATARACT EXTRACTION W/PHACO Right 11/11/2014   Procedure: CATARACT EXTRACTION PHACO AND INTRAOCULAR LENS PLACEMENT (IOC);  Surgeon: Sallee LangeSteven Dingeldein, MD;  Location: [facility] OR[facility];  Service: Ophthalmology;  Laterality: Right;  LOT PACK: [id number] H US: 00:29.7 AP: 11.2 CDE: 7.14  . CESAREAN SECTION    . CHOLECYSTECTOMY    . CYSTOSCOPY  03/2008   negative// Dr. Wanda PlumpHumphries urologist  . ENDOMETRIAL BIOPSY  07/1998   negative  . EYE SURGERY  12/2008   cataract removal left  . HERNIA REPAIR  05/2006   ruptured hernia repair after fall and then two more surgeries within as many weeks for complications(05/2006) Umbilical hernia repair (06/1997)  . KNEE ARTHROSCOPY W/ MENISCAL REPAIR     left knne   Social History   Tobacco Use  . Smoking status: Never Smoker  . Smokeless tobacco: Never Used  Substance Use Topics  . Alcohol use: Yes    Comment: twice a year  . Drug use: Yes    Types: Marijuana   Family History  Problem Relation Age of Onset  . Depression Mother   . Heart disease Father   . Diabetes Maternal Uncle   . Hypertension Maternal Grandmother   . Depression Brother        Depression secondary to MVA injuries  . Hypertension Brother   . Heart disease Paternal Grandfather   . Heart disease Paternal Grandmother    Allergies  Allergen Reactions  . Diclofenac Sodium     REACTION: allergic/ made asthma bad  . Etodolac     REACTION: GI  .  Metronidazole     REACTION: GI side eff  . Nsaids Other (See Comments)    GI Upset  . Oxybutynin Itching  . Sulfamethoxazole-Trimethoprim   . Tolterodine Tartrate     REACTION: None Effective  . Tramadol Other (See Comments)    Caused elevated BP  . Sulfa Antibiotics Itching and Rash   Current Outpatient Medications on File Prior to Visit  Medication Sig Dispense Refill  . acyclovir ointment (ZOVIRAX) 5 % Apply to fever blister 5 times daily for 4 days. 15 g 0  . albuterol (VENTOLIN HFA) 108 (90 BASE) MCG/ACT inhaler As needed for asthma attack     . ALPRAZolam (XANAX) 1 MG tablet TAKE 1 TABLET BY MOUTH THREE TIMES DAILY AS  NEEDED 90 tablet 3  . Ascorbic Acid (VITAMIN C) 1000 MG tablet Take 1,000 mg by mouth daily.      . Calcium Carbonate (CALCIUM 600) 1500 MG TABS Take as directed.     . Coenzyme Q10 (CO Q 10 PO) Take 1 tablet by mouth daily.    . cyclobenzaprine (FLEXERIL) 10 MG tablet Take 10 mg by mouth 3 (three) times daily as needed for muscle spasms.    . fexofenadine (ALLERGY RELIEF) 180 MG tablet TAKE ONE (1) TABLET BY MOUTH EVERY DAY 90 tablet 3  . fluticasone (FLONASE) 50 MCG/ACT nasal spray INSTILL 2 SPRAYS INTO EACH NOSTRIL TWICE A DAY 48 g 3  . lisinopril (PRINIVIL,ZESTRIL) 5 MG tablet Take 1 tablet (5 mg total) by mouth every morning. 90 tablet 3  . montelukast (SINGULAIR) 10 MG tablet take 1 tablet by mouth once daily 90 tablet 3  . Omega 3-6-9 Fatty Acids (OMEGA 3-6-9 COMPLEX PO) Take one tablet by mouth daily    . omeprazole (PRILOSEC) 40 MG capsule take 1 capsule by mouth once daily 90 capsule 3  . sertraline (ZOLOFT) 50 MG tablet Take 1 tablet (50 mg total) by mouth daily. 90 tablet 3  . tolterodine (DETROL LA) 4 MG 24 hr capsule Take 1 capsule (4 mg total) by mouth daily. 90 capsule 3   No current facility-administered medications on file prior to visit.     Observations/Objective: Patient sounded well/like her normal self Talkative/cheerful and mentally  sharp Not hoarse  No coughing during interview Not tearful  Talks candidly about her chroni pain and stressors    Assessment and Plan: Problem List Items Addressed This Visit      Other   ANXIETY DEPRESSION    Pt continues zoloft with good control overall  Had a hard time 2 weeks ago with initial pandemic stay at home order/but improved now  Positive outlook  Good support and talks on phone daily with her daughter  [place] self care and outdoor time when able  Continue to update       Chronic pain    Bilateral knee OA and meniscal tears  Had to re schedule her last set of knee injections with Dr Chip Boer she can call if she needs to  Limits mobility Continues norco (her routine pain med visit was done today)      Relevant Medications   HYDROcodone-acetaminophen (NORCO/VICODIN) 5-325 MG tablet   HYDROcodone-acetaminophen (NORCO) 5-325 MG tablet   HYDROcodone-acetaminophen (NORCO) 5-325 MG tablet   Encounter for pain management - Primary    Knees primarily- OA and meniscal tears  On norco #90 per mo w/o change Last refill 3/25  Post dated 3 px to fill on or after 4/25, 5/25, 07/13/18  Rev NCCS-no red flags Pain control is fair Good coping techniques  utd with contract rev and UDS Will f/u 3 mo for next pain management encounter           Follow Up Instructions: Continue norco as it has been px  I sent in post dated px  Continue zoloft for depression  Keep in close contact with family/friends by phone Get outdoors when able Let orthopedics know if your pain is severe/ ? They may be able to organize knee injections  Take care F/u 3 mo    I discussed the assessment and treatment plan with the patient. The patient was provided an opportunity to ask questions and all were answered. The patient agreed with the plan  and demonstrated an understanding of the instructions.   The patient was advised to call back or seek an in-person evaluation if the symptoms worsen or  if the condition fails to improve as anticipated.    Roxy Manns, MD

## 2018-05-05 NOTE — Assessment & Plan Note (Signed)
Bilateral knee OA and meniscal tears  Had to re schedule her last set of knee injections with Dr Chip Boer she can call if she needs to  Limits mobility Continues norco (her routine pain med visit was done today)

## 2018-05-10 ENCOUNTER — Other Ambulatory Visit: Payer: Self-pay | Admitting: Family Medicine

## 2018-06-16 ENCOUNTER — Other Ambulatory Visit: Payer: Self-pay | Admitting: Family Medicine

## 2018-06-16 NOTE — Telephone Encounter (Signed)
Name of Medication: xanax Name of Pharmacy: [facility] [address] Last [place] or Written Date and Quantity:  02/19/18 #90 tabs with 3 refills Last Office Visit and Type: 3 Mo Stop appt on 05/05/18 Next Office Visit and Type: CPE 12/01/18 Last Controlled Substance Agreement Date: 02/09/18 Last UDS:02/09/18

## 2018-07-31 DIAGNOSIS — M1712 Unilateral primary osteoarthritis, left knee: Secondary | ICD-10-CM | POA: Diagnosis not present

## 2018-07-31 DIAGNOSIS — M7581 Other shoulder lesions, right shoulder: Secondary | ICD-10-CM | POA: Diagnosis not present

## 2018-08-11 ENCOUNTER — Ambulatory Visit (INDEPENDENT_AMBULATORY_CARE_PROVIDER_SITE_OTHER): Payer: PPO | Admitting: Family Medicine

## 2018-08-11 DIAGNOSIS — G8929 Other chronic pain: Secondary | ICD-10-CM | POA: Diagnosis not present

## 2018-08-11 MED ORDER — HYDROCODONE-ACETAMINOPHEN 5-325 MG PO TABS: 1.0000 | ORAL_TABLET | Freq: Four times a day (QID) | ORAL | 0 refills | Status: DC | PRN

## 2018-08-11 NOTE — Progress Notes (Signed)
Virtual Visit via Telephone Note  I connected with Monica Madden on 08/11/18 at 12:00 PM EDT by telephone and verified that I am speaking with the correct person using two identifiers.  Location: Patient: home Provider: office    I discussed the limitations, risks, security and privacy concerns of performing an evaluation and management service by telephone and the availability of in person appointments. I also discussed with the patient that there may be a patient responsible charge related to this service. The patient expressed understanding and agreed to proceed.   History of Present Illness:  Here for 3 month encounter for chronic pain management (stop act)  Indication for chronic opioid: OA lf knees as well as meniscal tears   Medication and dose: norco 5-325 mg 1 tab up to every 6 hours prn pain   # pills per month: 90 Last refill was 07/13/18 for 90 pills   She has been taking usually 3 pills daily- pain is worse lately   Last UDS date: 02/09/18  Opioid Treatment Agreement signed (Y/N): yes  Opioid Treatment Agreement last reviewed with patient:  02/09/18   NCCSRS reviewed this encounter (include red flags):  today 08/11/18 with no red flags She also takes alprazolam 1 mg last refilled 6/29  She saw orthopedics 7/13 and had cortisone inj in L knee  Also R shoulder  First time she left house since march 13  They helped  Sees PA Rachelle Hora  They did not bring up surgery at that visit   Her daughter is home -cooks for her Dolores Patty enjoying it  Has a garden  The house is more lively and thrilled to have her there  Staying home  Has masks  Family shops for her etc   No falls  No change in mobility (always poor)- knee feels unstable at times- improved after her injection   Mood is stable -anxiety  Avoids social media and the news    Patient Active Problem List   Diagnosis Date Noted  . Encounter for chronic pain management 02/07/2017  . Encounter for pain  management 08/05/2016  . Chronic pain 11/17/2015  . Elevated glucose level 11/09/2015  . Need for hepatitis C screening test 10/20/2014  . Routine general medical examination at a health care facility 06/30/2011  . Amenorrhea 06/30/2011  . Adverse effects of medication 07/01/2010  . DYSPEPSIA 09/26/2009  . IRRITABLE BOWEL SYNDROME 08/07/2009  . KNEE PAIN, BILATERAL 08/07/2009  . CONSTIPATION 01/15/2009  . LEG PAIN, LEFT 10/09/2008  . Hyperlipidemia 05/09/2008  . Essential hypertension 03/29/2008  . 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 06/05/2007  . INCI HERNIA WITHOUT MENTION OBSTRUCTION/GANGRENE 05/12/2007  . FREQUENCY, URINARY 05/12/2007  . ARTHRALGIA 11/03/2006  . DEPENDENT EDEMA, LEGS 11/03/2006  . Morbid obesity ([place]) 07/15/2006  . ANXIETY DEPRESSION 07/15/2006  . MIGRAINE HEADACHE 07/15/2006  . RETINITIS PIGMENTOSA 07/15/2006  . VARICOSE VEINS, LOWER EXTREMITIES 07/15/2006  . ALLERGIC RHINITIS 07/15/2006  . ASTHMA 07/15/2006  . GERD 07/15/2006  . BACK PAIN, LUMBAR, CHRONIC 07/15/2006  . INCONTINENCE, URGE 07/15/2006   Past Medical History:  Diagnosis Date  . Allergy    allergic rhinitis  . Anemia   . Anxiety    ATTACKS  . Arthritis   . Asthma   . Cataract    left cataract removal 12/2008  . Chronic back pain    OA of spine  . Depression   . GERD (gastroesophageal reflux disease)    Endo negative 02/2000  . Hypertension  03/2008  . IBS (irritable bowel syndrome)   . MRSA infection 2008   Right leg  . Obesity   . Ovarian cyst   . Retinitis pigmentosa    blind   Past Surgical History:  Procedure Laterality Date  . APPENDECTOMY    . BLADDER SURGERY  1968   age 56 ? dilitation  . CATARACT EXTRACTION W/PHACO Right 11/11/2014   Procedure: CATARACT EXTRACTION PHACO AND INTRAOCULAR LENS PLACEMENT (IOC);  Surgeon: Sallee LangeSteven Dingeldein, MD;  Location: [facility];  Service: Ophthalmology;  Laterality: Right;  LOT PACK: [id number] H US: 00:29.7 AP: 11.2 CDE:  7.14  . CESAREAN SECTION    . CHOLECYSTECTOMY    . CYSTOSCOPY  03/2008   negative// Dr. Wanda PlumpHumphries urologist  . ENDOMETRIAL BIOPSY  07/1998   negative  . EYE SURGERY  12/2008   cataract removal left  . HERNIA REPAIR  05/2006   ruptured hernia repair after fall and then two more surgeries within as many weeks for complications(05/2006) Umbilical hernia repair (06/1997)  . KNEE ARTHROSCOPY W/ MENISCAL REPAIR     left knne   Social History   Tobacco Use  . Smoking status: Never Smoker  . Smokeless tobacco: Never Used  Substance Use Topics  . Alcohol use: Yes    Comment: twice a year  . Drug use: Yes    Types: Marijuana   Family History  Problem Relation Age of Onset  . Depression Mother   . Heart disease Father   . Diabetes Maternal Uncle   . Hypertension Maternal Grandmother   . Depression Brother        Depression secondary to MVA injuries  . Hypertension Brother   . Heart disease Paternal Grandfather   . Heart disease Paternal Grandmother    Allergies  Allergen Reactions  . Diclofenac Sodium     REACTION: allergic/ made asthma bad  . Etodolac     REACTION: GI  . Metronidazole     REACTION: GI side eff  . Nsaids Other (See Comments)    GI Upset  . Oxybutynin Itching  . Sulfamethoxazole-Trimethoprim   . Tolterodine Tartrate     REACTION: None Effective  . Tramadol Other (See Comments)    Caused elevated BP  . Sulfa Antibiotics Itching and Rash   Current Outpatient Medications on File Prior to Visit  Medication Sig Dispense Refill  . acyclovir ointment (ZOVIRAX) 5 % Apply to fever blister 5 times daily for 4 days. 15 g 0  . albuterol (VENTOLIN HFA) 108 (90 BASE) MCG/ACT inhaler As needed for asthma attack     . ALPRAZolam (XANAX) 1 MG tablet TAKE 1 TABLET BY MOUTH THREE TIMES DAILY AS NEEDED 90 tablet 3  . Ascorbic Acid (VITAMIN C) 1000 MG tablet Take 1,000 mg by mouth daily.      . Calcium Carbonate (CALCIUM 600) 1500 MG TABS Take as directed.     . Coenzyme  Q10 (CO Q 10 PO) Take 1 tablet by mouth daily.    . cyclobenzaprine (FLEXERIL) 10 MG tablet Take 10 mg by mouth 3 (three) times daily as needed for muscle spasms.    . fexofenadine (ALLEGRA) 180 MG tablet TAKE 1 TABLET BY MOUTH ONCE DAILY 90 tablet 3  . fluticasone (FLONASE) 50 MCG/ACT nasal spray INSTILL 2 SPRAYS INTO EACH NOSTRIL TWICE A DAY 48 g 3  . lisinopril (PRINIVIL,ZESTRIL) 5 MG tablet Take 1 tablet (5 mg total) by mouth every morning. 90 tablet 3  . montelukast (SINGULAIR) 10 MG tablet  take 1 tablet by mouth once daily 90 tablet 3  . Omega 3-6-9 Fatty Acids (OMEGA 3-6-9 COMPLEX PO) Take one tablet by mouth daily    . omeprazole (PRILOSEC) 40 MG capsule take 1 capsule by mouth once daily 90 capsule 3  . sertraline (ZOLOFT) 50 MG tablet Take 1 tablet (50 mg total) by mouth daily. 90 tablet 3  . tolterodine (DETROL LA) 4 MG 24 hr capsule Take 1 capsule (4 mg total) by mouth daily. 90 capsule 3   No current facility-administered medications on file prior to visit.      Review of Systems  Constitutional: Negative for chills, fever, malaise/fatigue and weight loss.  Eyes: Positive for blurred vision.  Respiratory: Negative for cough and shortness of breath.   Cardiovascular: Negative for chest pain, palpitations and leg swelling.  Musculoskeletal: Positive for back pain and joint pain.  Skin: Negative for rash.  Neurological: Negative for dizziness and headaches.  Psychiatric/Behavioral: The patient is nervous/anxious.     Observations/Objective: Pt sounds like her normal self  Not distressed  Not as anxious as usual  Cheerful and talkative  Not hoarse or congested No cough or sob  Affect is normal  Cognition is normal  Assessment and Plan: Problem List Items Addressed This Visit      Other   Chronic pain    Primarily in knees due to OA and meniscal tears Takes norco and gets injections Enc for chronic pain medication done today      Relevant Medications    HYDROcodone-acetaminophen (NORCO/VICODIN) 5-325 MG tablet   HYDROcodone-acetaminophen (NORCO) 5-325 MG tablet   HYDROcodone-acetaminophen (NORCO) 5-325 MG tablet   Encounter for chronic pain management - Primary    For knee pain /worse on L (severe OA and meniscal tear)  Also has pain in L shoulder recently  Injections for both recently at orthopedic office -this is helpful  Takes norco 5-325 mg every 6 hours prn   #90 per month NCCS rev-no red flags Disc sedation/habits as potential side effects  Individual monthly times 3 sent to pharmacy with date restrictions for filling given  Enc wt loss  Next visit 3 mo          Follow Up Instructions: Take care of yourself   Follow up with orthopedics as planned/needed   Get your flu shot in the fall  I refilled your norco Let us know if any problems    I discussed the assessment and treatment plan with the patient. The patient was provided an opportunity to ask questions and all were answered. The patient agreed with the plan and demonstrated an understanding of the instructions.   The patient was advised to call back or seek an in-person evaluation if the symptoms worsen or if the condition fails to improve as anticipated.  I provided 15 minutes of non-face-to-face time during this encounter.   Roxy MannsMarne Tower, MD

## 2018-08-11 NOTE — Patient Instructions (Signed)
Take care of yourself   Follow up with orthopedics as planned/needed   Get your flu shot in the fall  I refilled your norco Let us know if any problems

## 2018-08-13 ENCOUNTER — Encounter: Payer: Self-pay | Admitting: Family Medicine

## 2018-08-13 NOTE — Assessment & Plan Note (Signed)
For knee pain /worse on L (severe OA and meniscal tear)  Also has pain in L shoulder recently  Injections for both recently at orthopedic office -this is helpful  Takes norco 5-325 mg every 6 hours prn   #90 per month NCCS rev-no red flags Disc sedation/habits as potential side effects  Individual monthly times 3 sent to pharmacy with date restrictions for filling given  Enc wt loss  Next visit 3 mo

## 2018-08-13 NOTE — Assessment & Plan Note (Signed)
Primarily in knees due to OA and meniscal tears Takes norco and gets injections Enc for chronic pain medication done today

## 2018-10-17 ENCOUNTER — Other Ambulatory Visit: Payer: Self-pay | Admitting: Family Medicine

## 2018-10-17 NOTE — Telephone Encounter (Signed)
Name of Medication: xanax Name of Pharmacy: [facility] [address]. Last [place] or Written Date and Quantity:  06/16/18 #90 tabs with 3 refills Last Office Visit and Type: 3 Mo Stop appt on 08/14/18 Next Office Visit and Type: CPE 12/01/18 Last Controlled Substance Agreement Date: 02/09/18 Last UDS:02/09/18

## 2018-10-24 ENCOUNTER — Other Ambulatory Visit: Payer: Self-pay | Admitting: Family Medicine

## 2018-10-24 DIAGNOSIS — Z1231 Encounter for screening mammogram for malignant neoplasm of breast: Secondary | ICD-10-CM

## 2018-11-13 ENCOUNTER — Encounter: Payer: Self-pay | Admitting: Family Medicine

## 2018-11-13 ENCOUNTER — Ambulatory Visit (INDEPENDENT_AMBULATORY_CARE_PROVIDER_SITE_OTHER): Payer: PPO | Admitting: Family Medicine

## 2018-11-13 DIAGNOSIS — G8929 Other chronic pain: Secondary | ICD-10-CM

## 2018-11-13 DIAGNOSIS — M25562 Pain in left knee: Secondary | ICD-10-CM

## 2018-11-13 DIAGNOSIS — M25561 Pain in right knee: Secondary | ICD-10-CM | POA: Diagnosis not present

## 2018-11-13 DIAGNOSIS — H3552 Pigmentary retinal dystrophy: Secondary | ICD-10-CM | POA: Diagnosis not present

## 2018-11-13 MED ORDER — HYDROCODONE-ACETAMINOPHEN 5-325 MG PO TABS: 1.0000 | ORAL_TABLET | Freq: Four times a day (QID) | ORAL | 0 refills | Status: DC | PRN

## 2018-11-13 NOTE — Assessment & Plan Note (Signed)
Continues norco 5-325 one pill tid prn  #90 per mo  3 px written -with inst to fill now, nov, and dec Pt has appt here for health mt next mo No changes in pain -planning orthopedic f/u  NCCSRS rev-no red flags No problems with medications utd for UDS and opiod tx agreement  Enc wt loss for help with knee pain

## 2018-11-13 NOTE — Progress Notes (Signed)
Virtual Visit via Telephone Note  I connected with [name] on 11/13/18 at  2:00 PM EDT by telephone and verified that I am speaking with the correct person using two identifiers.  Location: Patient: home Provider: office  Parties taking part in encounter Patient: Monica RowerJoanne Madden Treating physician: Roxy MannsMarne Efrain Clauson MD    I discussed the limitations, risks, security and privacy concerns of performing an evaluation and management service by telephone and the availability of in person appointments. I also discussed with the patient that there may be a patient responsible charge related to this service. The patient expressed understanding and agreed to proceed.   History of Present Illness: Here for encounter for chronic pain management   Still in substantial pain -worse with recent weather changes  Had to have a tooth pulled /broken crown    Indication for chronic opioid: OA of knees as well as meniscal injury/ knee pain She is mobility impaired    Medication and dose: norco 5-325 mg 1 tab up to every 6 hours prn pain  # pills per month: 90  Last UDS date: 02/09/18   Re assuring   Opioid Treatment Agreement signed (Y/N): yes Opioid Treatment Agreement last reviewed with patient:  02/09/18   NCCSRS reviewed this encounter (include red flags):  today 11/13/18  No red flags Last refill dispensed on 10/12/18 for 90 pills   No problems/issues/side effects from her medications  Has a physical upcoming  Had eye exam today (legally blind)  All the visits stress her out  Will get a flu shot    Dr Floyce StakesGaines is her treating orthopedic doctor  She does get knee injections  Apprehensive to get surgery right now   Patient Active Problem List   Diagnosis Date Noted  . Encounter for chronic pain management 02/07/2017  . Encounter for pain management 08/05/2016  . Chronic pain 11/17/2015  . Elevated glucose level 11/09/2015  . Need for hepatitis C screening test 10/20/2014  . Routine  general medical examination at a health care facility 06/30/2011  . Amenorrhea 06/30/2011  . Adverse effects of medication 07/01/2010  . DYSPEPSIA 09/26/2009  . IRRITABLE BOWEL SYNDROME 08/07/2009  . KNEE PAIN, BILATERAL 08/07/2009  . CONSTIPATION 01/15/2009  . LEG PAIN, LEFT 10/09/2008  . Hyperlipidemia 05/09/2008  . Essential hypertension 03/29/2008  . 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 06/05/2007  . INCI HERNIA WITHOUT MENTION OBSTRUCTION/GANGRENE 05/12/2007  . FREQUENCY, URINARY 05/12/2007  . ARTHRALGIA 11/03/2006  . DEPENDENT EDEMA, LEGS 11/03/2006  . Morbid obesity (HCC) 07/15/2006  . ANXIETY DEPRESSION 07/15/2006  . MIGRAINE HEADACHE 07/15/2006  . RETINITIS PIGMENTOSA 07/15/2006  . VARICOSE VEINS, LOWER EXTREMITIES 07/15/2006  . ALLERGIC RHINITIS 07/15/2006  . ASTHMA 07/15/2006  . GERD 07/15/2006  . BACK PAIN, LUMBAR, CHRONIC 07/15/2006  . INCONTINENCE, URGE 07/15/2006   Past Medical History:  Diagnosis Date  . Allergy    allergic rhinitis  . Anemia   . Anxiety    ATTACKS  . Arthritis   . Asthma   . Cataract    left cataract removal 12/2008  . Chronic back pain    OA of spine  . Depression   . GERD (gastroesophageal reflux disease)    Endo negative 02/2000  . Hypertension 03/2008  . IBS (irritable bowel syndrome)   . MRSA infection 2008   Right leg  . Obesity   . Ovarian cyst   . Retinitis pigmentosa    blind   Past Surgical History:  Procedure Laterality  Date  . APPENDECTOMY    . BLADDER SURGERY  1968   age 58 ? dilitation  . CATARACT EXTRACTION W/PHACO Right 11/11/2014   Procedure: CATARACT EXTRACTION PHACO AND INTRAOCULAR LENS PLACEMENT (IOC);  Surgeon: Sallee Lange, MD;  Location: [facility] ORS;  Service: Ophthalmology;  Laterality: Right;  LOT PACK: [id number] H [place]: 00:29.7 AP: 11.2 CDE: 7.14  . CESAREAN SECTION    . CHOLECYSTECTOMY    . CYSTOSCOPY  03/2008   negative// Dr. Wanda Plump urologist  . ENDOMETRIAL BIOPSY  07/1998    negative  . EYE SURGERY  12/2008   cataract removal left  . HERNIA REPAIR  05/2006   ruptured hernia repair after fall and then two more surgeries within as many weeks for complications(05/2006) Umbilical hernia repair (06/1997)  . KNEE ARTHROSCOPY W/ MENISCAL REPAIR     left knne   Social History   Tobacco Use  . Smoking status: Never Smoker  . Smokeless tobacco: Never Used  Substance Use Topics  . Alcohol use: Yes    Comment: twice a year  . Drug use: Yes    Types: Marijuana   Family History  Problem Relation Age of Onset  . Depression Mother   . Heart disease Father   . Diabetes Maternal Uncle   . Hypertension Maternal Grandmother   . Depression Brother        Depression secondary to MVA injuries  . Hypertension Brother   . Heart disease Paternal Grandfather   . Heart disease Paternal Grandmother    Allergies  Allergen Reactions  . Diclofenac Sodium     REACTION: allergic/ made asthma bad  . Etodolac     REACTION: GI  . Metronidazole     REACTION: GI side eff  . Nsaids Other (See Comments)    GI Upset  . Oxybutynin Itching  . Sulfamethoxazole-Trimethoprim   . Tolterodine Tartrate     REACTION: None Effective  . Tramadol Other (See Comments)    Caused elevated BP  . Sulfa Antibiotics Itching and Rash   Current Outpatient Medications on File Prior to Visit  Medication Sig Dispense Refill  . acyclovir ointment (ZOVIRAX) 5 % Apply to fever blister 5 times daily for 4 days. 15 g 0  . albuterol (VENTOLIN HFA) 108 (90 BASE) MCG/ACT inhaler As needed for asthma attack     . ALPRAZolam (XANAX) 1 MG tablet TAKE 1 TABLET BY MOUTH THREE TIMES DAILY AS NEEDED 90 tablet 3  . Ascorbic Acid (VITAMIN C) 1000 MG tablet Take 1,000 mg by mouth daily.      . Calcium Carbonate (CALCIUM 600) 1500 MG TABS Take as directed.     . Coenzyme Q10 (CO Q 10 PO) Take 1 tablet by mouth daily.    . cyclobenzaprine (FLEXERIL) 10 MG tablet Take 10 mg by mouth 3 (three) times daily as needed  for muscle spasms.    . fexofenadine (ALLEGRA) 180 MG tablet TAKE 1 TABLET BY MOUTH ONCE DAILY 90 tablet 3  . fluticasone (FLONASE) 50 MCG/ACT nasal spray INSTILL 2 SPRAYS INTO EACH NOSTRIL TWICE A DAY 48 g 3  . lisinopril (PRINIVIL,ZESTRIL) 5 MG tablet Take 1 tablet (5 mg total) by mouth every morning. 90 tablet 3  . montelukast (SINGULAIR) 10 MG tablet take 1 tablet by mouth once daily 90 tablet 3  . Omega 3-6-9 Fatty Acids (OMEGA 3-6-9 COMPLEX PO) Take one tablet by mouth daily    . omeprazole (PRILOSEC) 40 MG capsule take 1 capsule by mouth once daily  90 capsule 3  . sertraline (ZOLOFT) 50 MG tablet Take 1 tablet (50 mg total) by mouth daily. 90 tablet 3  . tolterodine (DETROL LA) 4 MG 24 hr capsule Take 1 capsule (4 mg total) by mouth daily. 90 capsule 3   No current facility-administered medications on file prior to visit.      Observations/Objective: Pt sounds like her usual self Not distressed/ but somewhat tired today  Nl affect Nl cognition Talkative and pleasant  No hoarseness or slurred speech  Assessment and Plan: Problem List Items Addressed This Visit      Other   Morbid obesity ([place])    Weight loss would help knee arthritis pain        KNEE PAIN, BILATERAL    Ongoing with OA and morbid obesity  Sees ortho for injections Not considering surgery until pandemic slows down Enc low impact activity as tol and wt loss  Px for her norco written/ stop act visit done today      Encounter for chronic pain management - Primary    Continues norco 5-325 one pill tid prn  #90 per mo  3 px written -with inst to fill now, nov, and dec Pt has appt here for health mt next mo No changes in pain -planning orthopedic f/u  NCCSRS rev-no red flags No problems with medications utd for UDS and opiod tx agreement  Enc wt loss for help with knee pain            Follow Up Instructions: Continue current medications  we will see you in November     I discussed the  assessment and treatment plan with the patient. The patient was provided an opportunity to ask questions and all were answered. The patient agreed with the plan and demonstrated an understanding of the instructions.   The patient was advised to call back or seek an in-person evaluation if the symptoms worsen or if the condition fails to improve as anticipated.  I provided 14 minutes of non-face-to-face time during this encounter.   Loura Pardon, MD

## 2018-11-13 NOTE — Assessment & Plan Note (Signed)
Ongoing with OA and morbid obesity  Sees ortho for injections Not considering surgery until pandemic slows down Enc low impact activity as tol and wt loss  Px for her norco written/ stop act visit done today

## 2018-11-13 NOTE — Assessment & Plan Note (Signed)
Weight loss would help knee arthritis pain

## 2018-11-24 ENCOUNTER — Ambulatory Visit: Payer: PPO

## 2018-11-24 ENCOUNTER — Telehealth (INDEPENDENT_AMBULATORY_CARE_PROVIDER_SITE_OTHER): Payer: PPO | Admitting: Family Medicine

## 2018-11-24 ENCOUNTER — Other Ambulatory Visit (INDEPENDENT_AMBULATORY_CARE_PROVIDER_SITE_OTHER): Payer: PPO

## 2018-11-24 DIAGNOSIS — I10 Essential (primary) hypertension: Secondary | ICD-10-CM

## 2018-11-24 DIAGNOSIS — E7800 Pure hypercholesterolemia, unspecified: Secondary | ICD-10-CM | POA: Diagnosis not present

## 2018-11-24 DIAGNOSIS — R7309 Other abnormal glucose: Secondary | ICD-10-CM | POA: Diagnosis not present

## 2018-11-24 LAB — HEMOGLOBIN A1C: Hgb A1c MFr Bld: 5.6 % (ref 4.6–6.5)

## 2018-11-24 LAB — CBC WITH DIFFERENTIAL/PLATELET
Basophils Absolute: 0.1 10*3/uL (ref 0.0–0.1)
Basophils Relative: 1.1 % (ref 0.0–3.0)
Eosinophils Absolute: 0.3 10*3/uL (ref 0.0–0.7)
Eosinophils Relative: 3.4 % (ref 0.0–5.0)
HCT: 38.2 % (ref 36.0–46.0)
Hemoglobin: 12.8 g/dL (ref 12.0–15.0)
Lymphocytes Relative: 24.7 % (ref 12.0–46.0)
Lymphs Abs: 1.9 10*3/uL (ref 0.7–4.0)
MCHC: 33.4 g/dL (ref 30.0–36.0)
MCV: 88.6 fl (ref 78.0–100.0)
Monocytes Absolute: 0.5 10*3/uL (ref 0.1–1.0)
Monocytes Relative: 6 % (ref 3.0–12.0)
Neutro Abs: 5.1 10*3/uL (ref 1.4–7.7)
Neutrophils Relative %: 64.8 % (ref 43.0–77.0)
Platelets: 256 10*3/uL (ref 150.0–400.0)
RBC: 4.31 Mil/uL (ref 3.87–5.11)
RDW: 13.9 % (ref 11.5–15.5)
WBC: 7.9 10*3/uL (ref 4.0–10.5)

## 2018-11-24 LAB — LIPID PANEL
Cholesterol: 208 mg/dL — ABNORMAL HIGH (ref 0–200)
HDL: 41.4 mg/dL (ref >39.00)
NonHDL: 166.88
Total CHOL/HDL Ratio: 5
Triglycerides: 212 mg/dL — ABNORMAL HIGH (ref 0.0–149.0)
VLDL: 42.4 mg/dL — ABNORMAL HIGH (ref 0.0–40.0)

## 2018-11-24 LAB — COMPREHENSIVE METABOLIC PANEL
ALT: 21 U/L (ref 0–35)
AST: 15 U/L (ref 0–37)
Albumin: 4.2 g/dL (ref 3.5–5.2)
Alkaline Phosphatase: 73 U/L (ref 39–117)
BUN: 17 mg/dL (ref 6–23)
CO2: 32 mEq/L (ref 19–32)
Calcium: 9.4 mg/dL (ref 8.4–10.5)
Chloride: 100 mEq/L (ref 96–112)
Creatinine, Ser: 0.61 mg/dL (ref 0.40–1.20)
GFR: 101.33 mL/min (ref >60.00)
Glucose, Bld: 112 mg/dL — ABNORMAL HIGH (ref 70–99)
Potassium: 4.3 mEq/L (ref 3.5–5.1)
Sodium: 140 mEq/L (ref 135–145)
Total Bilirubin: 0.5 mg/dL (ref 0.2–1.2)
Total Protein: 6.7 g/dL (ref 6.0–8.3)

## 2018-11-24 LAB — LDL CHOLESTEROL, DIRECT: Direct LDL: 140 mg/dL

## 2018-11-24 LAB — TSH: TSH: 2.43 u[IU]/mL (ref 0.35–4.50)

## 2018-11-24 NOTE — Telephone Encounter (Signed)
Lab orders

## 2018-11-28 ENCOUNTER — Ambulatory Visit: Payer: PPO

## 2018-12-01 ENCOUNTER — Other Ambulatory Visit: Payer: Self-pay

## 2018-12-01 ENCOUNTER — Ambulatory Visit (INDEPENDENT_AMBULATORY_CARE_PROVIDER_SITE_OTHER): Payer: PPO | Admitting: Family Medicine

## 2018-12-01 ENCOUNTER — Encounter: Payer: PPO | Admitting: Family Medicine

## 2018-12-01 ENCOUNTER — Encounter: Payer: Self-pay | Admitting: Family Medicine

## 2018-12-01 VITALS — BP 128/82 | HR 82 | Temp 97.9°F | Ht 61.25 in | Wt 320.2 lb

## 2018-12-01 DIAGNOSIS — E7800 Pure hypercholesterolemia, unspecified: Secondary | ICD-10-CM

## 2018-12-01 DIAGNOSIS — Z0000 Encounter for general adult medical examination without abnormal findings: Secondary | ICD-10-CM

## 2018-12-01 DIAGNOSIS — F341 Dysthymic disorder: Secondary | ICD-10-CM

## 2018-12-01 DIAGNOSIS — Z23 Encounter for immunization: Secondary | ICD-10-CM

## 2018-12-01 DIAGNOSIS — I10 Essential (primary) hypertension: Secondary | ICD-10-CM

## 2018-12-01 DIAGNOSIS — R7309 Other abnormal glucose: Secondary | ICD-10-CM

## 2018-12-01 MED ORDER — OMEPRAZOLE 40 MG PO CPDR: DELAYED_RELEASE_CAPSULE | ORAL | 3 refills | Status: DC

## 2018-12-01 MED ORDER — LISINOPRIL 5 MG PO TABS: 5.0000 mg | ORAL_TABLET | Freq: Every morning | ORAL | 3 refills | Status: DC

## 2018-12-01 MED ORDER — TOLTERODINE TARTRATE ER 4 MG PO CP24: 4.0000 mg | ORAL_CAPSULE | Freq: Every day | ORAL | 3 refills | Status: DC

## 2018-12-01 MED ORDER — FLUTICASONE PROPIONATE 50 MCG/ACT NA SUSP: NASAL | 11 refills | Status: DC

## 2018-12-01 MED ORDER — SERTRALINE HCL 50 MG PO TABS: 50.0000 mg | ORAL_TABLET | Freq: Every day | ORAL | 3 refills | Status: DC

## 2018-12-01 MED ORDER — MONTELUKAST SODIUM 10 MG PO TABS: ORAL_TABLET | ORAL | 3 refills | Status: DC

## 2018-12-01 NOTE — Progress Notes (Signed)
Subjective:    Patient ID: Monica Madden, female    DOB: [birth year], 58 y.o.   MRN: [id number]  HPI Here for amw and health mt exam with rev of chronic medical problems   I have personally reviewed the Medicare Annual Wellness questionnaire and have noted 1. The patient's medical and social history 2. Their use of alcohol, tobacco or illicit drugs 3. Their current medications and supplements 4. The patient's functional ability including ADL's, fall risks, home safety risks and hearing or visual             impairment. 5. Diet and physical activities 6. Evidence for depression or mood disorders  The patients weight, height, BMI have been recorded in the chart and visual acuity is per eye clinic.  I have made referrals, counseling and provided education to the patient based review of the above and I have provided the pt with a written personalized care plan for preventive services. Reviewed and updated provider list, see scanned forms.  See scanned forms.  Routine anticipatory guidance given to patient.  See health maintenance. Colon cancer screening colonoscopy 9/11 Breast cancer screening mammogram 11/19 - has it set up Wednesday  Self breast exam- no lumps  Flu vaccine 10/19  Tetanus vaccine 10/16 Tdap  Pneumovax  -had pna 23 in 2013 -would like to update today Zoster vaccine-wants to get shingrix if it is covered   Falls -none  Fractures-none  Supplements - taking vit D and D  Exercise -limited/ knee/shoulder problems and legally blind Walks with cane and tries to stay active   Advance directive: has an utd living will and poa  Cognitive function addressed- see scanned forms- and if abnormal then additional documentation follows.  No memory/cognitive problems She handles her own affairs  Very mentally sharp   PMH and SH reviewed  Meds, vitals, and allergies reviewed.   ROS: See HPI.  Otherwise negative.    Weight : Wt Readings from Last 3 Encounters:  12/01/18  (!) 320 lb 3 oz (145.2 kg)  02/09/18 (!) 304 lb 4 oz (138 kg)  11/29/17 (!) 316 lb 8 oz (143.6 kg)  lack of activity due to pain  She is not as good about her diet   - she is motivated to get back to healthier food  She and her daughter are working on that  Was emotionally eating  60.01 kg/m   Hearing/vision:  Hearing Screening             Right ear:   40 40 40  40    Left ear:   40 40 40  40    Vision Screening Comments: Legally blind pt has retinitis pigmentosa- regular checks and progressive  Depression screen  Score 1   bp is stable today  No cp or palpitations or headaches or edema  No side effects to medicines  BP Readings from Last 3 Encounters:  12/01/18 128/82  02/09/18 128/74  11/29/17 126/80      Lab Results  Component Value Date   CREATININE 0.61 11/24/2018   BUN 17 11/24/2018   NA 140 11/24/2018   K 4.3 11/24/2018   CL 100 11/24/2018   CO2 32 11/24/2018   Lab Results  Component Value Date   ALT 21 11/24/2018   AST 15 11/24/2018   ALKPHOS 73 11/24/2018   BILITOT 0.5 11/24/2018     H/o elevated glucose Lab Results  Component Value Date   HGBA1C 5.6 11/24/2018  this is  stable from a year ago   Hyperlipidemia  Lab Results  Component Value Date   CHOL 208 (H) 11/24/2018   CHOL 199 11/22/2017   CHOL 195 11/10/2016   Lab Results  Component Value Date   HDL 41.40 11/24/2018   HDL 39.20 11/22/2017   HDL 48.60 11/10/2016   Lab Results  Component Value Date   LDLCALC 124 (H) 11/10/2016   LDLCALC 126 (H) 10/23/2014   LDLCALC 118 (H) 08/02/2013   Lab Results  Component Value Date   TRIG 212.0 (H) 11/24/2018   TRIG 272.0 (H) 11/22/2017   TRIG 113.0 11/10/2016   Lab Results  Component Value Date   CHOLHDL 5 11/24/2018   CHOLHDL 5 11/22/2017   CHOLHDL 4 11/10/2016   Lab Results  Component Value Date   LDLDIRECT 140.0 11/24/2018   LDLDIRECT 135.0 11/22/2017   LDLDIRECT 139.0  11/11/2015  really recently started watching her diet  She has eaten primarily vegetarian   Patient Active Problem List   Diagnosis Date Noted  . Medicare annual wellness visit, subsequent 12/01/2018  . Encounter for chronic pain management 02/07/2017  . Chronic pain 11/17/2015  . Elevated glucose level 11/09/2015  . Routine general medical examination at a health care facility 06/30/2011  . Amenorrhea 06/30/2011  . Adverse effects of medication 07/01/2010  . DYSPEPSIA 09/26/2009  . IRRITABLE BOWEL SYNDROME 08/07/2009  . KNEE PAIN, BILATERAL 08/07/2009  . CONSTIPATION 01/15/2009  . LEG PAIN, LEFT 10/09/2008  . Hyperlipidemia 05/09/2008  . Essential hypertension 03/29/2008  . 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 06/05/2007  . INCI HERNIA WITHOUT MENTION OBSTRUCTION/GANGRENE 05/12/2007  . FREQUENCY, URINARY 05/12/2007  . ARTHRALGIA 11/03/2006  . DEPENDENT EDEMA, LEGS 11/03/2006  . Morbid obesity (HCC) 07/15/2006  . ANXIETY DEPRESSION 07/15/2006  . MIGRAINE HEADACHE 07/15/2006  . RETINITIS PIGMENTOSA 07/15/2006  . VARICOSE VEINS, LOWER EXTREMITIES 07/15/2006  . ALLERGIC RHINITIS 07/15/2006  . ASTHMA 07/15/2006  . GERD 07/15/2006  . BACK PAIN, LUMBAR, CHRONIC 07/15/2006  . INCONTINENCE, URGE 07/15/2006   Past Medical History:  Diagnosis Date  . Allergy    allergic rhinitis  . Anemia   . Anxiety    ATTACKS  . Arthritis   . Asthma   . Cataract    left cataract removal 12/2008  . Chronic back pain    OA of spine  . Depression   . GERD (gastroesophageal reflux disease)    Endo negative 02/2000  . Hypertension 03/2008  . IBS (irritable bowel syndrome)   . MRSA infection 2008   Right leg  . Obesity   . Ovarian cyst   . Retinitis pigmentosa    blind   Past Surgical History:  Procedure Laterality Date  . APPENDECTOMY    . BLADDER SURGERY  1968   age 36 ? dilitation  . CATARACT EXTRACTION W/PHACO Right 11/11/2014   Procedure: CATARACT EXTRACTION PHACO  AND INTRAOCULAR LENS PLACEMENT (IOC);  Surgeon: Sallee Lange, MD;  Location: [facility] ORS;  Service: Ophthalmology;  Laterality: Right;  LOT PACK: [id number] H [place]: 00:29.7 AP: 11.2 CDE: 7.14  . CESAREAN SECTION    . CHOLECYSTECTOMY    . CYSTOSCOPY  03/2008   negative// Dr. Wanda Plump urologist  . ENDOMETRIAL BIOPSY  07/1998   negative  . EYE SURGERY  12/2008   cataract removal left  . HERNIA REPAIR  05/2006   ruptured hernia repair after fall and then two more surgeries within as many weeks for complications(05/2006) Umbilical hernia repair (06/1997)  . KNEE ARTHROSCOPY  W/ MENISCAL REPAIR     left knne   Social History   Tobacco Use  . Smoking status: Never Smoker  . Smokeless tobacco: Never Used  Substance Use Topics  . Alcohol use: Yes    Comment: twice a year  . Drug use: Yes    Types: Marijuana   Family History  Problem Relation Age of Onset  . Depression Mother   . Heart disease Father   . Diabetes Maternal Uncle   . Hypertension Maternal Grandmother   . Depression Brother        Depression secondary to MVA injuries  . Hypertension Brother   . Heart disease Paternal Grandfather   . Heart disease Paternal Grandmother    Allergies  Allergen Reactions  . Diclofenac Sodium     REACTION: allergic/ made asthma bad  . Etodolac     REACTION: GI  . Metronidazole     REACTION: GI side eff  . Nsaids Other (See Comments)    GI Upset  . Oxybutynin Itching  . Sulfamethoxazole-Trimethoprim   . Tolterodine Tartrate     REACTION: None Effective  . Tramadol Other (See Comments)    Caused elevated BP  . Sulfa Antibiotics Itching and Rash   Current Outpatient Medications on File Prior to Visit  Medication Sig Dispense Refill  . acyclovir ointment (ZOVIRAX) 5 % Apply to fever blister 5 times daily for 4 days. 15 g 0  . albuterol (VENTOLIN HFA) 108 (90 BASE) MCG/ACT inhaler As needed for asthma attack     . ALPRAZolam (XANAX) 1 MG tablet TAKE 1 TABLET BY MOUTH THREE TIMES  DAILY AS NEEDED 90 tablet 3  . Ascorbic Acid (VITAMIN C) 1000 MG tablet Take 1,000 mg by mouth daily.      . Calcium Carbonate (CALCIUM 600) 1500 MG TABS Take as directed.     . Coenzyme Q10 (CO Q 10 PO) Take 1 tablet by mouth daily.    . cyclobenzaprine (FLEXERIL) 10 MG tablet Take 10 mg by mouth 3 (three) times daily as needed for muscle spasms.    . fexofenadine (ALLEGRA) 180 MG tablet TAKE 1 TABLET BY MOUTH ONCE DAILY 90 tablet 3  . HYDROcodone-acetaminophen (NORCO) 5-325 MG tablet Take 1 tablet by mouth every 6 (six) hours as needed for moderate pain or severe pain. Do not fill before 11/13/18 90 tablet 0  . HYDROcodone-acetaminophen (NORCO) 5-325 MG tablet Take 1 tablet by mouth every 6 (six) hours as needed for moderate pain or severe pain. Do not fill before 01/13/19 90 tablet 0  . HYDROcodone-acetaminophen (NORCO/VICODIN) 5-325 MG tablet Take 1 tablet by mouth every 6 (six) hours as needed for moderate pain. Do not fill before 12/14/18 90 tablet 0  . Omega 3-6-9 Fatty Acids (OMEGA 3-6-9 COMPLEX PO) Take one tablet by mouth daily     No current facility-administered medications on file prior to visit.     Review of Systems  Constitutional: Negative for activity change, appetite change, fatigue, fever and unexpected weight change.  HENT: Negative for congestion, ear pain, rhinorrhea, sinus pressure and sore throat.   Eyes: Positive for visual disturbance. Negative for pain and redness.  Respiratory: Negative for cough, shortness of breath and wheezing.   Cardiovascular: Negative for chest pain and palpitations.  Gastrointestinal: Negative for abdominal pain, blood in stool, constipation and diarrhea.  Endocrine: Negative for polydipsia and polyuria.  Genitourinary: Negative for dysuria, frequency and urgency.  Musculoskeletal: Positive for arthralgias. Negative for back pain and myalgias.  Severe chronic knee pain and shoulder pain  Skin: Negative for pallor and rash.   Allergic/Immunologic: Negative for environmental allergies.  Neurological: Negative for dizziness, syncope and headaches.  Hematological: Negative for adenopathy. Does not bruise/bleed easily.  Psychiatric/Behavioral: Negative for decreased concentration and dysphoric mood. The patient is not nervous/anxious.        Objective:   Physical Exam Constitutional:      General: She is not in acute distress.    Appearance: Normal appearance. She is well-developed. She is obese. She is not ill-appearing or diaphoretic.  HENT:     Head: Normocephalic and atraumatic.     Right Ear: Tympanic membrane, ear canal and external ear normal.     Left Ear: Tympanic membrane, ear canal and external ear normal.     Nose: Nose normal. No congestion.     Mouth/Throat:     Mouth: Mucous membranes are moist.     Pharynx: Oropharynx is clear. No posterior oropharyngeal erythema.  Eyes:     General: No scleral icterus.    Extraocular Movements: Extraocular movements intact.     Conjunctiva/sclera: Conjunctivae normal.     Pupils: Pupils are equal, round, and reactive to light.     Comments: Pt is blind  Neck:     Musculoskeletal: Normal range of motion and neck supple. No neck rigidity or muscular tenderness.     Thyroid: No thyromegaly.     Vascular: No carotid bruit or JVD.  Cardiovascular:     Rate and Rhythm: Normal rate and regular rhythm.     Pulses: Normal pulses.     Heart sounds: Normal heart sounds. No gallop.   Pulmonary:     Effort: Pulmonary effort is normal. No respiratory distress.     Breath sounds: Normal breath sounds. No wheezing.     Comments: Good air exch Chest:     Chest wall: No tenderness.  Abdominal:     General: Bowel sounds are normal. There is no distension or abdominal bruit.     Palpations: Abdomen is soft. There is no mass.     Tenderness: There is no abdominal tenderness.     Hernia: No hernia is present.  Genitourinary:    Comments: Breast exam: No mass,  nodules, thickening, tenderness, bulging, retraction, inflamation, nipple discharge or skin changes noted.  No axillary or clavicular LA.     Exam done sitting-pt cannot get on table Musculoskeletal: Normal range of motion.        General: No tenderness.     Right lower leg: No edema.     Left lower leg: No edema.     Comments: Poor rom knees and shoulders  Lymphadenopathy:     Cervical: No cervical adenopathy.  Skin:    General: Skin is warm and dry.     Coloration: Skin is not pale.     Findings: No erythema or rash.     Comments: Stable flat brown nevus upper mid back   Small darker one under bra line- dark 2-3 mm Solar lentigines diffusely   Some erythema under breasts  Neurological:     Mental Status: She is alert. Mental status is at baseline.     Cranial Nerves: No cranial nerve deficit.     Motor: No abnormal muscle tone.     Coordination: Coordination normal.     Gait: Gait normal.     Deep Tendon Reflexes: Reflexes are normal and symmetric. Reflexes normal.  Psychiatric:  Mood and Affect: Mood normal.        Cognition and Memory: Cognition and memory normal.     Comments: Pleasant            Assessment & Plan:   Problem List Items Addressed This Visit      Cardiovascular and Mediastinum   Essential hypertension    bp in fair control at this time  BP Readings from Last 1 Encounters:  12/01/18 128/82   No changes needed Most recent labs reviewed  Disc lifstyle change with low sodium diet and exercise        Relevant Medications   lisinopril (ZESTRIL) 5 MG tablet     Other   Hyperlipidemia    Disc goals for lipids and reasons to control them Rev last labs with pt Rev low sat fat diet in detail LDL fairly stable at 124 Started watching diet in the last few days-primarily vegetarian Trig are fair       Relevant Medications   lisinopril (ZESTRIL) 5 MG tablet   Morbid obesity (HCC)    Reviewed health habits including diet and exercise and  skin cancer prevention Reviewed appropriate screening tests for age  Also reviewed health mt list, fam hx and immunization status , as well as social and family history   Recent wt gain Just started to change her diet with her daughter      ANXIETY DEPRESSION    Stable/pt coping well with her current situation Depression score:1 Positive outlook      Relevant Medications   sertraline (ZOLOFT) 50 MG tablet   Routine general medical examination at a health care facility    Reviewed health habits including diet and exercise and skin cancer prevention Reviewed appropriate screening tests for age  Also reviewed health mt list, fam hx and immunization status , as well as social and family history   See HPI Has mammogram set up next Wednesday Flu and pna vaccines given today  Disc shingrix vaccine and she will check on coverage  Advance directive utd  No cognitive concerns Nl hearing screen  Legally blind due to retinitis pigmentosa  Depression screen :1 Enc wt loss       Relevant Orders   Flu Vaccine QUAD 6+ mos PF IM (Fluarix Quad PF) (Completed)   Pneumococcal polysaccharide vaccine 23-valent greater than or equal to 2yo subcutaneous/IM (Completed)   Elevated glucose level    Lab Results  Component Value Date   HGBA1C 5.6 11/24/2018   Stable from a year ago Stressed imp of wt loss disc imp of low glycemic diet and wt loss to prevent DM2       Medicare annual wellness visit, subsequent - Primary    Reviewed health habits including diet and exercise and skin cancer prevention Reviewed appropriate screening tests for age  Also reviewed health mt list, fam hx and immunization status , as well as social and family history   See HPI Has mammogram set up next Wednesday Flu and pna vaccines given today  Disc shingrix vaccine and she will check on coverage  Advance directive utd  No cognitive concerns Nl hearing screen  Legally blind due to retinitis pigmentosa  Depression  screen :1 Enc wt loss          Other Visit Diagnoses    Need for influenza vaccination       Relevant Orders   Flu Vaccine QUAD 6+ mos PF IM (Fluarix Quad PF) (Completed)   Need for 23-polyvalent  pneumococcal polysaccharide vaccine       Relevant Orders   Pneumococcal polysaccharide vaccine 23-valent greater than or equal to 2yo subcutaneous/IM (Completed)

## 2018-12-01 NOTE — Patient Instructions (Addendum)
If you are interested in the shingles vaccine series (Shingrix), call your insurance or pharmacy to check on coverage and location it must be given.  If affordable - you can schedule it here or at your pharmacy depending on coverage (you may have to get it in a pharmacy with medicare)   For weight and diabetes prevention Try to get most of your carbohydrates from produce (with the exception of white potatoes)  Eat less bread/pasta/rice/snack foods/cereals/sweets and other items from the middle of the grocery store (processed carbs)   Flu shot today  Pneumonia shot today  The mole on your back looks stable  Watch the smaller /darker one under you bra line on the back  Measure them and watch for size or shape changes

## 2018-12-03 NOTE — Assessment & Plan Note (Signed)
Lab Results  Component Value Date   HGBA1C 5.6 11/24/2018   Stable from a year ago Stressed imp of wt loss disc imp of low glycemic diet and wt loss to prevent DM2

## 2018-12-03 NOTE — Assessment & Plan Note (Signed)
Stable/pt coping well with her current situation Depression score:1 Positive outlook

## 2018-12-03 NOTE — Assessment & Plan Note (Signed)
Disc goals for lipids and reasons to control them Rev last labs with pt Rev low sat fat diet in detail LDL fairly stable at 124 Started watching diet in the last few days-primarily vegetarian Trig are fair

## 2018-12-03 NOTE — Assessment & Plan Note (Signed)
bp in fair control at this time  BP Readings from Last 1 Encounters:  12/01/18 128/82   No changes needed Most recent labs reviewed  Disc lifstyle change with low sodium diet and exercise

## 2018-12-03 NOTE — Assessment & Plan Note (Signed)
Reviewed health habits including diet and exercise and skin cancer prevention Reviewed appropriate screening tests for age  Also reviewed health mt list, fam hx and immunization status , as well as social and family history   See HPI Has mammogram set up next Wednesday Flu and pna vaccines given today  Disc shingrix vaccine and she will check on coverage  Advance directive utd  No cognitive concerns Nl hearing screen  Legally blind due to retinitis pigmentosa  Depression screen :1 Enc wt loss

## 2018-12-03 NOTE — Assessment & Plan Note (Signed)
Reviewed health habits including diet and exercise and skin cancer prevention Reviewed appropriate screening tests for age  Also reviewed health mt list, fam hx and immunization status , as well as social and family history   Recent wt gain Just started to change her diet with her daughter

## 2018-12-03 NOTE — Assessment & Plan Note (Signed)
Reviewed health habits including diet and exercise and skin cancer prevention Reviewed appropriate screening tests for age  Also reviewed health mt list, fam hx and immunization status , as well as social and family history   See HPI Has mammogram set up next Wednesday Flu and pna vaccines given today  Disc shingrix vaccine and she will check on coverage  Advance directive utd  No cognitive concerns Nl hearing screen  Legally blind due to retinitis pigmentosa  Depression screen :1 Enc wt loss    

## 2018-12-06 ENCOUNTER — Ambulatory Visit
Admission: RE | Admit: 2018-12-06 | Discharge: 2018-12-06 | Disposition: A | Discharge status: Home / Self Care | Payer: PPO | Source: Ambulatory Visit | Attending: Family Medicine | Admitting: Family Medicine

## 2018-12-06 DIAGNOSIS — Z1231 Encounter for screening mammogram for malignant neoplasm of breast: Secondary | ICD-10-CM | POA: Diagnosis not present

## 2019-01-29 DIAGNOSIS — M1712 Unilateral primary osteoarthritis, left knee: Secondary | ICD-10-CM | POA: Diagnosis not present

## 2019-01-29 DIAGNOSIS — M7581 Other shoulder lesions, right shoulder: Secondary | ICD-10-CM | POA: Diagnosis not present

## 2019-02-05 ENCOUNTER — Encounter: Payer: Self-pay | Admitting: Family Medicine

## 2019-02-06 NOTE — Telephone Encounter (Signed)
Letter printed and placed in your inbox for review  

## 2019-02-13 ENCOUNTER — Other Ambulatory Visit: Payer: Self-pay

## 2019-02-13 ENCOUNTER — Encounter: Payer: Self-pay | Admitting: Family Medicine

## 2019-02-13 ENCOUNTER — Ambulatory Visit (INDEPENDENT_AMBULATORY_CARE_PROVIDER_SITE_OTHER): Payer: PPO | Admitting: Family Medicine

## 2019-02-13 DIAGNOSIS — G8929 Other chronic pain: Secondary | ICD-10-CM | POA: Diagnosis not present

## 2019-02-13 DIAGNOSIS — M25561 Pain in right knee: Secondary | ICD-10-CM

## 2019-02-13 DIAGNOSIS — F341 Dysthymic disorder: Secondary | ICD-10-CM

## 2019-02-13 DIAGNOSIS — M25562 Pain in left knee: Secondary | ICD-10-CM

## 2019-02-13 MED ORDER — HYDROCODONE-ACETAMINOPHEN 5-325 MG PO TABS: 1.0000 | ORAL_TABLET | Freq: Four times a day (QID) | ORAL | 0 refills | Status: DC | PRN

## 2019-02-13 MED ORDER — ALPRAZOLAM 1 MG PO TABS: 1.0000 mg | ORAL_TABLET | Freq: Three times a day (TID) | ORAL | 3 refills | Status: DC | PRN

## 2019-02-13 NOTE — Assessment & Plan Note (Signed)
Stable pain from knees /shoulder with recent injections Most recent orthopedic note reviewed with her  She is not ready to schedule surgery  Continues norco #90 per month  Will update opioid agreement when able  NCCRS reviwed w/o red flags  Three post dated 30 day px sent to the pharmacy Next enc plan for 3 mo

## 2019-02-13 NOTE — Progress Notes (Signed)
Virtual Visit via Telephone Note  I connected with [name] on 02/13/19 at 12:00 PM EST by telephone and verified that I am speaking with the correct person using two identifiers.  Location: Patient: home Provider: office   I discussed the limitations, risks, security and privacy concerns of performing an evaluation and management service by telephone and the availability of in person appointments. I also discussed with the patient that there may be a patient responsible charge related to this service. The patient expressed understanding and agreed to proceed.  Parties involved in encounter  Patient: Monica Madden  Provider:  Roxy Manns MD    History of Present Illness: Pt presents for encounter for chronic pain management   Pain is about the same (sometimes worse in cold weather)   Indication for chronic opioid: severe OA of knees and meniscal injury causing knee pain  Mobility impaired from this along with morbid obesity  Medication and dose: norco 5-325 mg 1 tab up to every 6 hours prn     # pills per month: 90  Last fill was 01/13/19 for 90 pills   Last UDS date: 1/20 , did send updated agreement through email on 02/05/19  Saw ortho on 1/11 and had inj in L knee and R shoulder (also shoulder tendonitis) Now L shoulder is bothering her    Opioid Treatment Agreement signed (Y/N): yes Opioid Treatment Agreement last reviewed with patient:  today 02/13/19  [facility] reviewed this encounter (include red flags):  today 02/13/19 no red flags   Has lost a few lb by her scales Eating better  Her pants are less tight  Cutting back on sweets   Emotionally doing a bit better now that politics have calmed down  Has cabin fever- looking forward to getting the covid vaccine  continues sertraline Due for refill of xanax /uses with caution  Patient Active Problem List   Diagnosis Date Noted  . Medicare annual wellness visit, subsequent 12/01/2018  . Encounter for chronic  pain management 02/07/2017  . Chronic pain 11/17/2015  . Elevated glucose level 11/09/2015  . Routine general medical examination at a health care facility 06/30/2011  . Amenorrhea 06/30/2011  . Adverse effects of medication 07/01/2010  . DYSPEPSIA 09/26/2009  . IRRITABLE BOWEL SYNDROME 08/07/2009  . KNEE PAIN, BILATERAL 08/07/2009  . CONSTIPATION 01/15/2009  . LEG PAIN, LEFT 10/09/2008  . Hyperlipidemia 05/09/2008  . Essential hypertension 03/29/2008  . 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 06/05/2007  . INCI HERNIA WITHOUT MENTION OBSTRUCTION/GANGRENE 05/12/2007  . FREQUENCY, URINARY 05/12/2007  . ARTHRALGIA 11/03/2006  . DEPENDENT EDEMA, LEGS 11/03/2006  . Morbid obesity (HCC) 07/15/2006  . ANXIETY DEPRESSION 07/15/2006  . MIGRAINE HEADACHE 07/15/2006  . RETINITIS PIGMENTOSA 07/15/2006  . VARICOSE VEINS, LOWER EXTREMITIES 07/15/2006  . ALLERGIC RHINITIS 07/15/2006  . ASTHMA 07/15/2006  . GERD 07/15/2006  . BACK PAIN, LUMBAR, CHRONIC 07/15/2006  . INCONTINENCE, URGE 07/15/2006   Past Medical History:  Diagnosis Date  . Allergy    allergic rhinitis  . Anemia   . Anxiety    ATTACKS  . Arthritis   . Asthma   . Cataract    left cataract removal 12/2008  . Chronic back pain    OA of spine  . Depression   . GERD (gastroesophageal reflux disease)    Endo negative 02/2000  . Hypertension 03/2008  . IBS (irritable bowel syndrome)   . MRSA infection 2008   Right leg  . Obesity   .  Ovarian cyst   . Retinitis pigmentosa    blind   Past Surgical History:  Procedure Laterality Date  . APPENDECTOMY    . BLADDER SURGERY  1968   age 74 ? dilitation  . CATARACT EXTRACTION W/PHACO Right 11/11/2014   Procedure: CATARACT EXTRACTION PHACO AND INTRAOCULAR LENS PLACEMENT (IOC);  Surgeon: Estill Cotta, MD;  Location: [facility] ORS;  Service: Ophthalmology;  Laterality: Right;  LOT PACK: [id number] H [place]: 00:29.7 AP: 11.2 CDE: 7.14  . CESAREAN SECTION    .  CHOLECYSTECTOMY    . CYSTOSCOPY  03/2008   negative// Dr. Reece Agar urologist  . ENDOMETRIAL BIOPSY  07/1998   negative  . EYE SURGERY  12/2008   cataract removal left  . HERNIA REPAIR  05/2006   ruptured hernia repair after fall and then two more surgeries within as many weeks for [id number](29/5621) Umbilical hernia repair (06/1997)  . KNEE ARTHROSCOPY W/ MENISCAL REPAIR     left knne   Social History   Tobacco Use  . Smoking status: Never Smoker  . Smokeless tobacco: Never Used  Substance Use Topics  . Alcohol use: Yes    Comment: twice a year  . Drug use: Yes    Types: Marijuana   Family History  Problem Relation Age of Onset  . Depression Mother   . Heart disease Father   . Diabetes Maternal Uncle   . Hypertension Maternal Grandmother   . Depression Brother        Depression secondary to MVA injuries  . Hypertension Brother   . Heart disease Paternal Grandfather   . Heart disease Paternal Grandmother    Allergies  Allergen Reactions  . Diclofenac Sodium     REACTION: allergic/ made asthma bad  . Etodolac     REACTION: GI  . Metronidazole     REACTION: GI side eff  . Nsaids Other (See Comments)    GI Upset  . Oxybutynin Itching  . Sulfamethoxazole-Trimethoprim   . Tolterodine Tartrate     REACTION: None Effective  . Tramadol Other (See Comments)    Caused elevated BP  . Sulfa Antibiotics Itching and Rash   Current Outpatient Medications on File Prior to Visit  Medication Sig Dispense Refill  . acyclovir ointment (ZOVIRAX) 5 % Apply to fever blister 5 times daily for 4 days. 15 g 0  . albuterol (VENTOLIN HFA) 108 (90 BASE) MCG/ACT inhaler As needed for asthma attack     . Ascorbic Acid (VITAMIN C) 1000 MG tablet Take 1,000 mg by mouth daily.      . Calcium Carbonate (CALCIUM 600) 1500 MG TABS Take as directed.     . Coenzyme Q10 (CO Q 10 PO) Take 1 tablet by mouth daily.    . cyclobenzaprine (FLEXERIL) 10 MG tablet Take 10 mg by mouth 3 (three) times  daily as needed for muscle spasms.    . fexofenadine (ALLEGRA) 180 MG tablet TAKE 1 TABLET BY MOUTH ONCE DAILY 90 tablet 3  . fluticasone (FLONASE) 50 MCG/ACT nasal spray INSTILL 2 SPRAYS INTO EACH NOSTRIL TWICE A DAY 16 g 11  . lisinopril (ZESTRIL) 5 MG tablet Take 1 tablet (5 mg total) by mouth every morning. 90 tablet 3  . montelukast (SINGULAIR) 10 MG tablet take 1 tablet by mouth once daily 90 tablet 3  . Omega 3-6-9 Fatty Acids (OMEGA 3-6-9 COMPLEX PO) Take one tablet by mouth daily    . omeprazole (PRILOSEC) 40 MG capsule take 1 capsule by mouth once daily 90  capsule 3  . sertraline (ZOLOFT) 50 MG tablet Take 1 tablet (50 mg total) by mouth daily. 90 tablet 3  . tolterodine (DETROL LA) 4 MG 24 hr capsule Take 1 capsule (4 mg total) by mouth daily. 90 capsule 3   No current facility-administered medications on file prior to visit.    Observations/Objective: Pt sounds like her normal self  Less anxious today/ talkative and pleasant  Nl cognition- good historian Voices usual amt of pain  No cough or sob during interview  Assessment and Plan: Problem List Items Addressed This Visit      Other   Morbid obesity (HCC)    Per pt- loosing some wt at home with better eating  Enc her to keep it up  This may help other medical problems such as chronic pain      ANXIETY DEPRESSION    Ups and downs with stress Continues zoloft Cheerful today Refilled alprazolam with warning of sedation/habit      Relevant Medications   ALPRAZolam (XANAX) 1 MG tablet   KNEE PAIN, BILATERAL    Recent inj of L  Severe oa and meniscal tears Rev last ortho note norco refilled      Encounter for chronic pain management - Primary    Stable pain from knees /shoulder with recent injections Most recent orthopedic note reviewed with her  She is not ready to schedule surgery  Continues norco #90 per month  Will update opioid agreement when able  NCCRS reviwed w/o red flags  Three post dated 30 day px  sent to the pharmacy Next enc plan for 3 mo          Follow Up Instructions: I sent your norco px as well as xanax Keep working on weight loss the best you can Continue to see orthopedics Follow up for next pain management visit in 3 months   I discussed the assessment and treatment plan with the patient. The patient was provided an opportunity to ask questions and all were answered. The patient agreed with the plan and demonstrated an understanding of the instructions.   The patient was advised to call back or seek an in-person evaluation if the symptoms worsen or if the condition fails to improve as anticipated.  I provided 16 minutes of non-face-to-face time during this encounter.   Roxy Manns, MD

## 2019-02-13 NOTE — Assessment & Plan Note (Signed)
Ups and downs with stress Continues zoloft Cheerful today Refilled alprazolam with warning of sedation/habit

## 2019-02-13 NOTE — Assessment & Plan Note (Signed)
Recent inj of L  Severe oa and meniscal tears Rev last ortho note norco refilled

## 2019-02-13 NOTE — Patient Instructions (Signed)
I sent your norco px as well as xanax Keep working on weight loss the best you can Continue to see orthopedics Follow up for next pain management visit in 3 months

## 2019-02-13 NOTE — Assessment & Plan Note (Signed)
Per pt- loosing some wt at home with better eating  Enc her to keep it up  This may help other medical problems such as chronic pain

## 2019-04-11 DIAGNOSIS — Z23 Encounter for immunization: Secondary | ICD-10-CM | POA: Diagnosis not present

## 2019-05-02 ENCOUNTER — Other Ambulatory Visit: Payer: Self-pay | Admitting: *Deleted

## 2019-05-02 MED ORDER — FEXOFENADINE HCL 180 MG PO TABS: ORAL_TABLET | ORAL | 1 refills | Status: DC

## 2019-05-09 ENCOUNTER — Encounter: Payer: Self-pay | Admitting: Family Medicine

## 2019-05-09 ENCOUNTER — Ambulatory Visit (INDEPENDENT_AMBULATORY_CARE_PROVIDER_SITE_OTHER): Payer: PPO | Admitting: Family Medicine

## 2019-05-09 DIAGNOSIS — M25561 Pain in right knee: Secondary | ICD-10-CM | POA: Diagnosis not present

## 2019-05-09 DIAGNOSIS — G8929 Other chronic pain: Secondary | ICD-10-CM

## 2019-05-09 DIAGNOSIS — M25562 Pain in left knee: Secondary | ICD-10-CM

## 2019-05-09 MED ORDER — HYDROCODONE-ACETAMINOPHEN 5-325 MG PO TABS: 1.0000 | ORAL_TABLET | Freq: Four times a day (QID) | ORAL | 0 refills | Status: DC | PRN

## 2019-05-09 NOTE — Assessment & Plan Note (Signed)
Ongoing joint pain worst in knees /due for an injection  Continues norco 90 per mo and it helps much  utd opiod agreement and pt will come in for UDS 2 weeks after 2nd covid vaccine  NCCRS reviewed-no red flags  Three post dated px sent to pharmacy  Disc caution of sedation and habit inst to f/u in 3 mo for next enc for chronic pain management

## 2019-05-09 NOTE — Assessment & Plan Note (Signed)
Enc better diet and exercise as tolerated  Weight adds to chronic pain and mobility issues

## 2019-05-09 NOTE — Assessment & Plan Note (Signed)
Continues to be severe Due for joint injection  Will need knee repl eventually  Trying to work on wt loss Continues norco for chronic pain

## 2019-05-09 NOTE — Patient Instructions (Addendum)
Call and make an appt for urine drug screen   No change in medication Schedule your next pain management encounter for 3 months  Continue orthopedic follow up

## 2019-05-09 NOTE — Progress Notes (Signed)
Virtual Visit via Telephone Note  I connected with Monica Madden on 05/09/19 at 10:15 AM EDT by telephone and verified that I am speaking with the correct person using two identifiers.  Location: Patient: home Provider: office    I discussed the limitations, risks, security and privacy concerns of performing an evaluation and management service by telephone and the availability of in person appointments. I also discussed with the patient that there may be a patient responsible charge related to this service. The patient expressed understanding and agreed to proceed.  Parties involved in encounter  [place]   Provider:  Loura Pardon MD     History of Present Illness: Pt presents for encounter for chronic pain management   Indication for chronic opioid: severe pain from OA and meniscal injury in knees in setting of morbid obesity   Had her first moderna vaccine on 3/21 [facility]  2nd one is planned  Family got theirs also   Pain medication still helps a lot  Able to do more things physically with pain control  Has a new massage machine for shoulders and neck also -helpful    Medication and dose: norco 5-325 mg 1 tab up to every 6 hours prn   # pills per month: 90 Last fill was 04/13/19 ,  This lasts 30 days   Last UDS date: 02/09/18   She does use marijuana -helps pain  Also on xanax    Opioid Treatment Agreement signed (Y/N): yes     Opioid Treatment Agreement last reviewed with patient:  yes- 02/05/19   [place] reviewed this encounter (include red flags):  today 05/09/19 and no red flags  Due for ortho visit-will wait until 2 weeks after her 2nd covid vaccine  2 weeks ago had a flare with L knee after walking around the house-it caught her /almost fell (feels like it goes out on her)  Took 4 days to get over that  Does some in place exercise on the couch    Weight - feels like she is the same clothing wise/does not ave scales  If any loss -  suspects 5-7 lb  Eating more salads    Also takes xanax for anxiety along with sertraline   Patient Active Problem List   Diagnosis Date Noted  . Medicare annual wellness visit, subsequent 12/01/2018  . Encounter for chronic pain management 02/07/2017  . Chronic pain 11/17/2015  . Elevated glucose level 11/09/2015  . Routine general medical examination at a health care facility 06/30/2011  . Amenorrhea 06/30/2011  . Adverse effects of medication 07/01/2010  . DYSPEPSIA 09/26/2009  . IRRITABLE BOWEL SYNDROME 08/07/2009  . KNEE PAIN, BILATERAL 08/07/2009  . CONSTIPATION 01/15/2009  . LEG PAIN, LEFT 10/09/2008  . Hyperlipidemia 05/09/2008  . Essential hypertension 03/29/2008  . 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 06/05/2007  . INCI HERNIA WITHOUT MENTION OBSTRUCTION/GANGRENE 05/12/2007  . FREQUENCY, URINARY 05/12/2007  . ARTHRALGIA 11/03/2006  . DEPENDENT EDEMA, LEGS 11/03/2006  . Morbid obesity ([place]) 07/15/2006  . ANXIETY DEPRESSION 07/15/2006  . MIGRAINE HEADACHE 07/15/2006  . RETINITIS PIGMENTOSA 07/15/2006  . VARICOSE VEINS, LOWER EXTREMITIES 07/15/2006  . ALLERGIC RHINITIS 07/15/2006  . ASTHMA 07/15/2006  . GERD 07/15/2006  . BACK PAIN, LUMBAR, CHRONIC 07/15/2006  . INCONTINENCE, URGE 07/15/2006   Past Medical History:  Diagnosis Date  . Allergy    allergic rhinitis  . Anemia   . Anxiety    ATTACKS  . Arthritis   . Asthma   .  Cataract    left cataract removal 12/2008  . Chronic back pain    OA of spine  . Depression   . GERD (gastroesophageal reflux disease)    Endo negative 02/2000  . Hypertension 03/2008  . IBS (irritable bowel syndrome)   . MRSA infection 2008   Right leg  . Obesity   . Ovarian cyst   . Retinitis pigmentosa    blind   Past Surgical History:  Procedure Laterality Date  . APPENDECTOMY    . BLADDER SURGERY  1968   age 9 ? dilitation  . CATARACT EXTRACTION W/PHACO Right 11/11/2014   Procedure: CATARACT EXTRACTION  PHACO AND INTRAOCULAR LENS PLACEMENT (IOC);  Surgeon: Sallee Lange, MD;  Location: [facility] ORS;  Service: Ophthalmology;  Laterality: Right;  LOT PACK: [id number] H [place]: 00:29.7 AP: 11.2 CDE: 7.14  . CESAREAN SECTION    . CHOLECYSTECTOMY    . CYSTOSCOPY  03/2008   negative// Dr. Wanda Plump urologist  . ENDOMETRIAL BIOPSY  07/1998   negative  . EYE SURGERY  12/2008   cataract removal left  . HERNIA REPAIR  05/2006   ruptured hernia repair after fall and then two more surgeries within as many weeks for complications(05/2006) Umbilical hernia repair (06/1997)  . KNEE ARTHROSCOPY W/ MENISCAL REPAIR     left knne   Social History   Tobacco Use  . Smoking status: Never Smoker  . Smokeless tobacco: Never Used  Substance Use Topics  . Alcohol use: Yes    Comment: twice a year  . Drug use: Yes    Types: Marijuana   Family History  Problem Relation Age of Onset  . Depression Mother   . Heart disease Father   . Diabetes Maternal Uncle   . Hypertension Maternal Grandmother   . Depression Brother        Depression secondary to MVA injuries  . Hypertension Brother   . Heart disease Paternal Grandfather   . Heart disease Paternal Grandmother    Allergies  Allergen Reactions  . Diclofenac Sodium     REACTION: allergic/ made asthma bad  . Etodolac     REACTION: GI  . Metronidazole     REACTION: GI side eff  . Nsaids Other (See Comments)    GI Upset  . Oxybutynin Itching  . Sulfamethoxazole-Trimethoprim   . Tolterodine Tartrate     REACTION: None Effective  . Tramadol Other (See Comments)    Caused elevated BP  . Sulfa Antibiotics Itching and Rash   Current Outpatient Medications on File Prior to Visit  Medication Sig Dispense Refill  . acyclovir ointment (ZOVIRAX) 5 % Apply to fever blister 5 times daily for 4 days. 15 g 0  . albuterol (VENTOLIN HFA) 108 (90 BASE) MCG/ACT inhaler As needed for asthma attack     . ALPRAZolam (XANAX) 1 MG tablet Take 1 tablet (1 mg total) by  mouth 3 (three) times daily as needed. 90 tablet 3  . Ascorbic Acid (VITAMIN C) 1000 MG tablet Take 1,000 mg by mouth daily.      . Calcium Carbonate (CALCIUM 600) 1500 MG TABS Take as directed.     . Coenzyme Q10 (CO Q 10 PO) Take 1 tablet by mouth daily.    . cyclobenzaprine (FLEXERIL) 10 MG tablet Take 10 mg by mouth 3 (three) times daily as needed for muscle spasms.    . fexofenadine (ALLEGRA) 180 MG tablet TAKE 1 TABLET BY MOUTH ONCE DAILY 90 tablet 1  . fluticasone (FLONASE) 50  MCG/ACT nasal spray INSTILL 2 SPRAYS INTO EACH NOSTRIL TWICE A DAY 16 g 11  . lisinopril (ZESTRIL) 5 MG tablet Take 1 tablet (5 mg total) by mouth every morning. 90 tablet 3  . montelukast (SINGULAIR) 10 MG tablet take 1 tablet by mouth once daily 90 tablet 3  . Omega 3-6-9 Fatty Acids (OMEGA 3-6-9 COMPLEX PO) Take one tablet by mouth daily    . omeprazole (PRILOSEC) 40 MG capsule take 1 capsule by mouth once daily 90 capsule 3  . sertraline (ZOLOFT) 50 MG tablet Take 1 tablet (50 mg total) by mouth daily. 90 tablet 3  . tolterodine (DETROL LA) 4 MG 24 hr capsule Take 1 capsule (4 mg total) by mouth daily. 90 capsule 3   No current facility-administered medications on file prior to visit.    Review of Systems  Constitutional: Negative for chills, fever and malaise/fatigue.  HENT: Negative for congestion, ear pain, sinus pain and sore throat.   Eyes: Negative for blurred vision, discharge and redness.  Respiratory: Negative for cough, shortness of breath and stridor.   Cardiovascular: Negative for chest pain, palpitations and leg swelling.  Gastrointestinal: Negative for abdominal pain, diarrhea, nausea and vomiting.  Musculoskeletal: Positive for back pain and joint pain. Negative for myalgias.  Skin: Negative for rash.  Neurological: Negative for dizziness and headaches.    Observations/Objective: Pt sounds like her usual self  Cheerful -mood is good  Talkative  Nl cognition-good historian   Assessment  and Plan: Problem List Items Addressed This Visit      Other   Morbid obesity (HCC)    Enc better diet and exercise as tolerated  Weight adds to chronic pain and mobility issues      KNEE PAIN, BILATERAL    Continues to be severe Due for joint injection  Will need knee repl eventually  Trying to work on wt loss Continues norco for chronic pain      Encounter for chronic pain management - Primary    Ongoing joint pain worst in knees /due for an injection  Continues norco 90 per mo and it helps much  utd opiod agreement and pt will come in for UDS 2 weeks after 2nd covid vaccine  NCCRS reviewed-no red flags  Three post dated px sent to pharmacy  Disc caution of sedation and habit inst to f/u in 3 mo for next enc for chronic pain management          Follow Up Instructions: Call and make an appt for urine drug screen   No change in medication Schedule your next pain management encounter for 3 months  Continue orthopedic follow up   I discussed the assessment and treatment plan with the patient. The patient was provided an opportunity to ask questions and all were answered. The patient agreed with the plan and demonstrated an understanding of the instructions.   The patient was advised to call back or seek an in-person evaluation if the symptoms worsen or if the condition fails to improve as anticipated.  I provided 15 minutes of non-face-to-face time during this encounter.   Roxy Manns, MD

## 2019-05-30 ENCOUNTER — Telehealth: Payer: Self-pay | Admitting: Family Medicine

## 2019-05-30 ENCOUNTER — Other Ambulatory Visit (INDEPENDENT_AMBULATORY_CARE_PROVIDER_SITE_OTHER): Payer: PPO

## 2019-05-30 DIAGNOSIS — G8929 Other chronic pain: Secondary | ICD-10-CM | POA: Diagnosis not present

## 2019-05-30 NOTE — Telephone Encounter (Signed)
I used the new order Let me know if it is not correct

## 2019-05-30 NOTE — Telephone Encounter (Signed)
-----   Message from Alvina Chou sent at 05/29/2019  2:26 PM EDT ----- Regarding: Lab orders for Wednesday, 5.12.21 Lab order for UDS

## 2019-06-01 LAB — DRUG MONITORING, PANEL 8 WITH CONFIRMATION, URINE
6 Acetylmorphine: NEGATIVE ng/mL (ref <10)
Alcohol Metabolites: NEGATIVE ng/mL
Alphahydroxyalprazolam: 263 ng/mL — ABNORMAL HIGH (ref <25)
Alphahydroxymidazolam: NEGATIVE ng/mL (ref <50)
Alphahydroxytriazolam: NEGATIVE ng/mL (ref <50)
Aminoclonazepam: NEGATIVE ng/mL (ref <25)
Amphetamines: NEGATIVE ng/mL (ref <500)
Benzodiazepines: POSITIVE ng/mL — AB (ref <100)
Buprenorphine, Urine: NEGATIVE ng/mL (ref <5)
Cocaine Metabolite: NEGATIVE ng/mL (ref <150)
Codeine: NEGATIVE ng/mL (ref <50)
Creatinine: 81.2 mg/dL
Hydrocodone: 669 ng/mL — ABNORMAL HIGH (ref <50)
Hydromorphone: 126 ng/mL — ABNORMAL HIGH (ref <50)
Hydroxyethylflurazepam: NEGATIVE ng/mL (ref <50)
Lorazepam: NEGATIVE ng/mL (ref <50)
MDMA: NEGATIVE ng/mL (ref <500)
Marijuana Metabolite: NEGATIVE ng/mL (ref <20)
Morphine: NEGATIVE ng/mL (ref <50)
Nordiazepam: NEGATIVE ng/mL (ref <50)
Norhydrocodone: 732 ng/mL — ABNORMAL HIGH (ref <50)
Opiates: POSITIVE ng/mL — AB (ref <100)
Oxazepam: NEGATIVE ng/mL (ref <50)
Oxidant: NEGATIVE ug/mL
Oxycodone: NEGATIVE ng/mL (ref <100)
Temazepam: NEGATIVE ng/mL (ref <50)
pH: 7.3 (ref 4.5–9.0)

## 2019-06-01 LAB — DM TEMPLATE

## 2019-06-12 ENCOUNTER — Other Ambulatory Visit: Payer: Self-pay | Admitting: Family Medicine

## 2019-06-13 NOTE — Telephone Encounter (Signed)
Name of Medication: xanax Name of Pharmacy: [facility] [address] Last [place] or Written Date and Quantity: 02/13/19 #90 tabs w/ 3 refills Last Office Visit and Type: 05/09/19 (phone call) STOP act appt Next Office Visit and Type:12/03/19 CPE  Last Controlled Substance Agreement Date: 02/09/18 Last UDS:02/09/18

## 2019-07-13 DIAGNOSIS — M1712 Unilateral primary osteoarthritis, left knee: Secondary | ICD-10-CM | POA: Diagnosis not present

## 2019-07-13 DIAGNOSIS — M7581 Other shoulder lesions, right shoulder: Secondary | ICD-10-CM | POA: Diagnosis not present

## 2019-07-31 ENCOUNTER — Encounter: Payer: Self-pay | Admitting: Family Medicine

## 2019-08-01 MED ORDER — ALBUTEROL SULFATE HFA 108 (90 BASE) MCG/ACT IN AERS: 2.0000 | INHALATION_SPRAY | RESPIRATORY_TRACT | 3 refills | Status: DC | PRN

## 2019-08-01 MED ORDER — FLUTICASONE PROPIONATE 50 MCG/ACT NA SUSP: NASAL | 3 refills | Status: DC

## 2019-08-10 ENCOUNTER — Telehealth (INDEPENDENT_AMBULATORY_CARE_PROVIDER_SITE_OTHER): Payer: PPO | Admitting: Family Medicine

## 2019-08-10 ENCOUNTER — Other Ambulatory Visit: Payer: Self-pay

## 2019-08-10 ENCOUNTER — Encounter: Payer: Self-pay | Admitting: Family Medicine

## 2019-08-10 DIAGNOSIS — G8929 Other chronic pain: Secondary | ICD-10-CM

## 2019-08-10 MED ORDER — HYDROCODONE-ACETAMINOPHEN 5-325 MG PO TABS: 1.0000 | ORAL_TABLET | Freq: Four times a day (QID) | ORAL | 0 refills | Status: DC | PRN

## 2019-08-10 NOTE — Progress Notes (Signed)
Virtual Visit via Telephone Note  I connected with [name] on 08/10/19 at 12:00 PM EDT by telephone and verified that I am speaking with the correct person using two identifiers.  Location: Patient: home Provider: office   I discussed the limitations, risks, security and privacy concerns of performing an evaluation and management service by telephone and the availability of in person appointments. I also discussed with the patient that there may be a patient responsible charge related to this service. The patient expressed understanding and agreed to proceed.  Parties involved in encounter  Patient: Monica Madden  Provider:  Roxy Manns MD     History of Present Illness: Pt presents for encounter for chronic pain management/medicine  Indication for chronic opioid: severe knee pain from OA and meniscal injury   Good and bad days   Today her pain is about 4-5 /10 getting up - improved now 1-2 after norco   Had her orthopedic visit late in June  R shoulder and L knee injections  That definitely helps a lot  Did not discuss surgery/knee replacement this past visit   Could not afford- her coverage would not be good   Goes every 3 months - keeps it on a schedule if she can    Medication and dose: norco 5-325 1 tab Q 6 h prn No new side effects Does not feel need to change this    # pills per month: 90 Has 8 pills left   Last fill was 07/14/19   Last UDS date: 05/30/19  Opioid Treatment Agreement signed (Y/N): yes Opioid Treatment Agreement last reviewed with patient:  05/30/19  NCCSRS reviewed this encounter (include red flags):  today , no red flags   Also takes xanax/aware   Has lost wt eating more vegetarian and also less sweets  Daughter cooks and buys fruit  309 lb at ortho office - was 320 lb here in November  She did have a fall -tripped over dishwasher door in a rush  No injuries  Was able to pull herself up with a chair  Her father had emergency  surgery in the spring- had family staying for 2 weeks in a camper (she would visit)   Patient Active Problem List   Diagnosis Date Noted  . Medicare annual wellness visit, subsequent 12/01/2018  . Encounter for chronic pain management 02/07/2017  . Chronic pain 11/17/2015  . Elevated glucose level 11/09/2015  . Routine general medical examination at a health care facility 06/30/2011  . Amenorrhea 06/30/2011  . Adverse effects of medication 07/01/2010  . DYSPEPSIA 09/26/2009  . IRRITABLE BOWEL SYNDROME 08/07/2009  . KNEE PAIN, BILATERAL 08/07/2009  . CONSTIPATION 01/15/2009  . LEG PAIN, LEFT 10/09/2008  . Hyperlipidemia 05/09/2008  . Essential hypertension 03/29/2008  . 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 06/05/2007  . INCI HERNIA WITHOUT MENTION OBSTRUCTION/GANGRENE 05/12/2007  . FREQUENCY, URINARY 05/12/2007  . ARTHRALGIA 11/03/2006  . DEPENDENT EDEMA, LEGS 11/03/2006  . Morbid obesity (HCC) 07/15/2006  . ANXIETY DEPRESSION 07/15/2006  . MIGRAINE HEADACHE 07/15/2006  . RETINITIS PIGMENTOSA 07/15/2006  . VARICOSE VEINS, LOWER EXTREMITIES 07/15/2006  . ALLERGIC RHINITIS 07/15/2006  . ASTHMA 07/15/2006  . GERD 07/15/2006  . BACK PAIN, LUMBAR, CHRONIC 07/15/2006  . INCONTINENCE, URGE 07/15/2006   Past Medical History:  Diagnosis Date  . Allergy    allergic rhinitis  . Anemia   . Anxiety    ATTACKS  . Arthritis   . Asthma   . Cataract  left cataract removal 12/2008  . Chronic back pain    OA of spine  . Depression   . GERD (gastroesophageal reflux disease)    Endo negative 02/2000  . Hypertension 03/2008  . IBS (irritable bowel syndrome)   . MRSA infection 2008   Right leg  . Obesity   . Ovarian cyst   . Retinitis pigmentosa    blind   Past Surgical History:  Procedure Laterality Date  . APPENDECTOMY    . BLADDER SURGERY  1968   age 22 ? dilitation  . CATARACT EXTRACTION W/PHACO Right 11/11/2014   Procedure: CATARACT EXTRACTION PHACO AND  INTRAOCULAR LENS PLACEMENT (IOC);  Surgeon: Sallee Lange, MD;  Location: [facility] OR[facility];  Service: Ophthalmology;  Laterality: Right;  LOT PACK: [id number] H [place]: 00:29.7 AP: 11.2 CDE: 7.14  . CESAREAN SECTION    . CHOLECYSTECTOMY    . CYSTOSCOPY  03/2008   negative// Dr. Wanda Plump urologist  . ENDOMETRIAL BIOPSY  07/1998   negative  . EYE SURGERY  12/2008   cataract removal left  . HERNIA REPAIR  05/2006   ruptured hernia repair after fall and then two more surgeries within as many weeks for complications(05/2006) Umbilical hernia repair (06/1997)  . KNEE ARTHROSCOPY W/ MENISCAL REPAIR     left knne   Social History   Tobacco Use  . Smoking status: Never Smoker  . Smokeless tobacco: Never Used  Substance Use Topics  . Alcohol use: Yes    Comment: twice a year  . Drug use: Yes    Types: Marijuana   Family History  Problem Relation Age of Onset  . Depression Mother   . Heart disease Father   . Diabetes Maternal Uncle   . Hypertension Maternal Grandmother   . Depression Brother        Depression secondary to MVA injuries  . Hypertension Brother   . Heart disease Paternal Grandfather   . Heart disease Paternal Grandmother    Allergies  Allergen Reactions  . Diclofenac Sodium     REACTION: allergic/ made asthma bad  . Etodolac     REACTION: GI  . Metronidazole     REACTION: GI side eff  . Nsaids Other (See Comments)    GI Upset  . Oxybutynin Itching  . Sulfamethoxazole-Trimethoprim   . Tolterodine Tartrate     REACTION: None Effective  . Tramadol Other (See Comments)    Caused elevated BP  . Sulfa Antibiotics Itching and Rash   Current Outpatient Medications on File Prior to Visit  Medication Sig Dispense Refill  . acyclovir ointment (ZOVIRAX) 5 % Apply to fever blister 5 times daily for 4 days. 15 g 0  . albuterol (VENTOLIN HFA) 108 (90 Base) MCG/ACT inhaler Inhale 2 puffs into the lungs every 4 (four) hours as needed for wheezing or shortness of breath. As needed  for asthma attack 24 g 3  . ALPRAZolam (XANAX) 1 MG tablet TAKE 1 TABLET(1 MG) BY MOUTH THREE TIMES DAILY AS NEEDED 90 tablet 3  . Ascorbic Acid (VITAMIN C) 1000 MG tablet Take 1,000 mg by mouth daily.      . Calcium Carbonate (CALCIUM 600) 1500 MG TABS Take as directed.     . Coenzyme Q10 (CO Q 10 PO) Take 1 tablet by mouth daily.    . cyclobenzaprine (FLEXERIL) 10 MG tablet Take 10 mg by mouth 3 (three) times daily as needed for muscle spasms.    . fexofenadine (ALLEGRA) 180 MG tablet TAKE 1 TABLET BY  MOUTH ONCE DAILY 90 tablet 1  . fluticasone (FLONASE) 50 MCG/ACT nasal spray INSTILL 2 SPRAYS INTO EACH NOSTRIL TWICE A DAY 48 g 3  . lisinopril (ZESTRIL) 5 MG tablet Take 1 tablet (5 mg total) by mouth every morning. 90 tablet 3  . montelukast (SINGULAIR) 10 MG tablet take 1 tablet by mouth once daily 90 tablet 3  . Omega 3-6-9 Fatty Acids (OMEGA 3-6-9 COMPLEX PO) Take one tablet by mouth daily    . omeprazole (PRILOSEC) 40 MG capsule take 1 capsule by mouth once daily 90 capsule 3  . sertraline (ZOLOFT) 50 MG tablet Take 1 tablet (50 mg total) by mouth daily. 90 tablet 3  . tolterodine (DETROL LA) 4 MG 24 hr capsule Take 1 capsule (4 mg total) by mouth daily. 90 capsule 3   No current facility-administered medications on file prior to visit.     Review of Systems  Constitutional: Negative for chills, fever and malaise/fatigue.  HENT: Negative for congestion, ear pain, sinus pain and sore throat.   Eyes: Positive for blurred vision. Negative for discharge and redness.  Respiratory: Negative for cough, shortness of breath and stridor.   Cardiovascular: Negative for chest pain, palpitations and leg swelling.  Gastrointestinal: Negative for abdominal pain, diarrhea, nausea and vomiting.  Musculoskeletal: Positive for back pain and joint pain. Negative for myalgias.  Skin: Negative for rash.  Neurological: Negative for dizziness and headaches.       One fall  Psychiatric/Behavioral: Negative  for depression. The patient is nervous/anxious.     Observations/Objective: Pt sounds like her usual self  No changes in voice  Nl cognition/ good historian Cheerful/good mood and talkative today  Assessment and Plan: Problem List Items Addressed This Visit      Other   Morbid obesity (HCC)    Loosing wt with less sugar and also vegetarian diet commended      Encounter for chronic pain management - Primary    Continue 90 norco per mo 5-325 mg taking every 6 h prn Next 3 refills done today  Up to date uds and contract renew  Continues ortho f/u as well  No problems/side effects with medication  Will not make any changes Rev database and no red flags          Follow Up Instructions: No changes in treatment   Continue working on diet for weight loss   I discussed the assessment and treatment plan with the patient. The patient was provided an opportunity to ask questions and all were answered. The patient agreed with the plan and demonstrated an understanding of the instructions.   The patient was advised to call back or seek an in-person evaluation if the symptoms worsen or if the condition fails to improve as anticipated.  I provided 16 minutes of non-face-to-face time during this encounter.   Roxy Manns, MD

## 2019-08-10 NOTE — Assessment & Plan Note (Signed)
Loosing wt with less sugar and also vegetarian diet commended

## 2019-08-10 NOTE — Assessment & Plan Note (Signed)
Continue 90 norco per mo 5-325 mg taking every 6 h prn Next 3 refills done today  Up to date uds and contract renew  Continues ortho f/u as well  No problems/side effects with medication  Will not make any changes Rev database and no red flags

## 2019-08-10 NOTE — Patient Instructions (Signed)
No changes in treatment   Continue working on diet for weight loss

## 2019-08-29 ENCOUNTER — Encounter: Payer: Self-pay | Admitting: Family Medicine

## 2019-10-03 ENCOUNTER — Other Ambulatory Visit: Payer: Self-pay | Admitting: Family Medicine

## 2019-10-03 DIAGNOSIS — Z1231 Encounter for screening mammogram for malignant neoplasm of breast: Secondary | ICD-10-CM

## 2019-10-13 ENCOUNTER — Other Ambulatory Visit: Payer: Self-pay | Admitting: Family Medicine

## 2019-10-15 ENCOUNTER — Telehealth: Payer: Self-pay | Admitting: Family Medicine

## 2019-10-15 NOTE — Telephone Encounter (Signed)
Pharmacy sent [place] 2 refill request one was denied and one was sent to PCP for approval. Pt notified one is still pending for PCP approval

## 2019-10-15 NOTE — Telephone Encounter (Signed)
Name of Medication: xanax Name of Pharmacy: [facility] [address] Last [place] or Written Date and Quantity: 06/13/19 #90 tabs w/ 3 refills Last Office Visit and Type: 08/10/19 (phone call) STOP act appt Next Office Visit and Type:11/08/19  (phone call) STOP act appt Last Controlled Substance Agreement Date: 02/09/18 Last UDS:02/09/18

## 2019-10-15 NOTE — Telephone Encounter (Signed)
Pt called wanting to know why her alprazolam was denied.  Please advise  Pt is out of meds

## 2019-10-17 DIAGNOSIS — M7581 Other shoulder lesions, right shoulder: Secondary | ICD-10-CM | POA: Diagnosis not present

## 2019-10-17 DIAGNOSIS — M1712 Unilateral primary osteoarthritis, left knee: Secondary | ICD-10-CM | POA: Diagnosis not present

## 2019-10-29 ENCOUNTER — Other Ambulatory Visit: Payer: Self-pay | Admitting: Family Medicine

## 2019-11-08 ENCOUNTER — Encounter: Payer: Self-pay | Admitting: Family Medicine

## 2019-11-08 ENCOUNTER — Other Ambulatory Visit: Payer: Self-pay

## 2019-11-08 ENCOUNTER — Telehealth (INDEPENDENT_AMBULATORY_CARE_PROVIDER_SITE_OTHER): Payer: PPO | Admitting: Family Medicine

## 2019-11-08 DIAGNOSIS — M25562 Pain in left knee: Secondary | ICD-10-CM

## 2019-11-08 DIAGNOSIS — G8929 Other chronic pain: Secondary | ICD-10-CM

## 2019-11-08 DIAGNOSIS — M25561 Pain in right knee: Secondary | ICD-10-CM | POA: Diagnosis not present

## 2019-11-08 MED ORDER — HYDROCODONE-ACETAMINOPHEN 5-325 MG PO TABS
1.0000 | ORAL_TABLET | Freq: Four times a day (QID) | ORAL | 0 refills | Status: DC | PRN
Start: 2019-11-08 — End: 2020-02-11

## 2019-11-08 NOTE — Assessment & Plan Note (Signed)
Severe, bilateral and worse on the L  Joint inj done 9/29 in L  Next visit to ortho plans both  Continues to work on wt loss  May consider knee repl in the future

## 2019-11-08 NOTE — Assessment & Plan Note (Signed)
Pt takes norco 5-325 mg 1 pill up to every 6 hours prn and gets 90 pills per mo  utd drug screen/contract 5/21 No red flags with [place] 3 px written to refill 10/26, 11/26, then 12.26  Continue to monitor Sees orthopedics for knee/shoulder injections for chronic pain as well

## 2019-11-08 NOTE — Progress Notes (Signed)
Virtual Visit via Telephone Note  I connected with [name] on 11/08/19 at 11:00 AM EDT by telephone and verified that I am speaking with the correct person using two identifiers.  Location: Patient: home Provider: office   I discussed the limitations, risks, security and privacy concerns of performing an evaluation and management service by telephone and the availability of in person appointments. I also discussed with the patient that there may be a patient responsible charge related to this service. The patient expressed understanding and agreed to proceed.  Parties involved in encounter  Patient: Monica Madden  Provider:  Roxy Manns MD    History of Present Illness: Pt presents for encounter for chronic pain management   No changes since last visit  Weather changes this time of year makes joint pain worse    Indication for chronic opioid: chronic knee pain -OA and meniscal and shoulder pain  Has pain all over most days but the L knee is the worst  Not ready for surgery /also financial   Medication and dose: norco 5-325 mg every 6 hours prn  # pills per month: 90  Last UDS date: 05/30/19  Opioid Treatment Agreement signed (Y/N): yes  Opioid Treatment Agreement last reviewed with patient:  05/30/19  NCCSRS reviewed this encounter (include red flags):  today, no red flags Last refill of norco was 10/14/19 for 90 pills   She also takes alprazolam 1 mg -this was refilled 9/27 for 90 pills    she had f/u with orthopedics on 9/29 for knee OA and R rotator cuff tendonitis (bone spurs and arthritis as well)  Had L knee and R shoulder injection This gives her some relief (seems to wear off quicker these days)  Next time will get R knee done   Doing a lot more stretching lately   Has PE planned here next month   No changes in her bp Lost a few more lb when she went to the orthopedic doctor   Patient Active Problem List   Diagnosis Date Noted  . Medicare annual  wellness visit, subsequent 12/01/2018  . Encounter for chronic pain management 02/07/2017  . Chronic pain 11/17/2015  . Elevated glucose level 11/09/2015  . Routine general medical examination at a health care facility 06/30/2011  . Amenorrhea 06/30/2011  . Adverse effects of medication 07/01/2010  . DYSPEPSIA 09/26/2009  . IRRITABLE BOWEL SYNDROME 08/07/2009  . KNEE PAIN, BILATERAL 08/07/2009  . CONSTIPATION 01/15/2009  . LEG PAIN, LEFT 10/09/2008  . Hyperlipidemia 05/09/2008  . Essential hypertension 03/29/2008  . 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 06/05/2007  . INCI HERNIA WITHOUT MENTION OBSTRUCTION/GANGRENE 05/12/2007  . FREQUENCY, URINARY 05/12/2007  . ARTHRALGIA 11/03/2006  . DEPENDENT EDEMA, LEGS 11/03/2006  . Morbid obesity (HCC) 07/15/2006  . ANXIETY DEPRESSION 07/15/2006  . MIGRAINE HEADACHE 07/15/2006  . RETINITIS PIGMENTOSA 07/15/2006  . VARICOSE VEINS, LOWER EXTREMITIES 07/15/2006  . ALLERGIC RHINITIS 07/15/2006  . ASTHMA 07/15/2006  . GERD 07/15/2006  . BACK PAIN, LUMBAR, CHRONIC 07/15/2006  . INCONTINENCE, URGE 07/15/2006   Past Medical History:  Diagnosis Date  . Allergy    allergic rhinitis  . Anemia   . Anxiety    ATTACKS  . Arthritis   . Asthma   . Cataract    left cataract removal 12/2008  . Chronic back pain    OA of spine  . Depression   . GERD (gastroesophageal reflux disease)    Endo negative 02/2000  . Hypertension 03/2008  .  IBS (irritable bowel syndrome)   . MRSA infection 2008   Right leg  . Obesity   . Ovarian cyst   . Retinitis pigmentosa    blind   Past Surgical History:  Procedure Laterality Date  . APPENDECTOMY    . BLADDER SURGERY  1968   age 46 ? dilitation  . CATARACT EXTRACTION W/PHACO Right 11/11/2014   Procedure: CATARACT EXTRACTION PHACO AND INTRAOCULAR LENS PLACEMENT (IOC);  Surgeon: Sallee Lange, MD;  Location: [facility] OR[facility];  Service: Ophthalmology;  Laterality: Right;  LOT PACK: [id number] H [place]:  00:29.7 AP: 11.2 CDE: 7.14  . CESAREAN SECTION    . CHOLECYSTECTOMY    . CYSTOSCOPY  03/2008   negative// Dr. Wanda Plump urologist  . ENDOMETRIAL BIOPSY  07/1998   negative  . EYE SURGERY  12/2008   cataract removal left  . HERNIA REPAIR  05/2006   ruptured hernia repair after fall and then two more surgeries within as many weeks for complications(05/2006) Umbilical hernia repair (06/1997)  . KNEE ARTHROSCOPY W/ MENISCAL REPAIR     left knne   Social History   Tobacco Use  . Smoking status: Never Smoker  . Smokeless tobacco: Never Used  Substance Use Topics  . Alcohol use: Yes    Comment: twice a year  . Drug use: Yes    Types: Marijuana   Family History  Problem Relation Age of Onset  . Depression Mother   . Heart disease Father   . Diabetes Maternal Uncle   . Hypertension Maternal Grandmother   . Depression Brother        Depression secondary to MVA injuries  . Hypertension Brother   . Heart disease Paternal Grandfather   . Heart disease Paternal Grandmother    Allergies  Allergen Reactions  . Diclofenac Sodium     REACTION: allergic/ made asthma bad  . Etodolac     REACTION: GI  . Metronidazole     REACTION: GI side eff  . Nsaids Other (See Comments)    GI Upset  . Oxybutynin Itching  . Sulfamethoxazole-Trimethoprim   . Tolterodine Tartrate     REACTION: None Effective  . Tramadol Other (See Comments)    Caused elevated BP  . Sulfa Antibiotics Itching and Rash   Current Outpatient Medications on File Prior to Visit  Medication Sig Dispense Refill  . acyclovir ointment (ZOVIRAX) 5 % Apply to fever blister 5 times daily for 4 days. 15 g 0  . ALLERGY RELIEF 180 MG tablet TAKE 1 TABLET BY MOUTH EVERY DAY 90 tablet 1  . ALPRAZolam (XANAX) 1 MG tablet TAKE 1 TABLET(1 MG) BY MOUTH THREE TIMES DAILY AS NEEDED 90 tablet 3  . Ascorbic Acid (VITAMIN C) 1000 MG tablet Take 1,000 mg by mouth daily.      . Calcium Carbonate (CALCIUM 600) 1500 MG TABS Take as  directed.     . Coenzyme Q10 (CO Q 10 PO) Take 1 tablet by mouth daily.    . cyclobenzaprine (FLEXERIL) 10 MG tablet Take 10 mg by mouth 3 (three) times daily as needed for muscle spasms.    . fluticasone (FLONASE) 50 MCG/ACT nasal spray INSTILL 2 SPRAYS INTO EACH NOSTRIL TWICE A DAY 48 g 3  . lisinopril (ZESTRIL) 5 MG tablet Take 1 tablet (5 mg total) by mouth every morning. 90 tablet 3  . montelukast (SINGULAIR) 10 MG tablet take 1 tablet by mouth once daily 90 tablet 3  . Omega 3-6-9 Fatty Acids (OMEGA 3-6-9 COMPLEX PO)  Take one tablet by mouth daily    . omeprazole (PRILOSEC) 40 MG capsule take 1 capsule by mouth once daily 90 capsule 3  . sertraline (ZOLOFT) 50 MG tablet Take 1 tablet (50 mg total) by mouth daily. 90 tablet 3  . tolterodine (DETROL LA) 4 MG 24 hr capsule Take 1 capsule (4 mg total) by mouth daily. 90 capsule 3  . albuterol (VENTOLIN HFA) 108 (90 Base) MCG/ACT inhaler Inhale 2 puffs into the lungs every 4 (four) hours as needed for wheezing or shortness of breath. As needed for asthma attack 24 g 3   No current facility-administered medications on file prior to visit.     Observations/Objective: Pt sounds like her usual self  Nl voice Nl mood/pleasant  Good cognition/good historian  Assessment and Plan: Problem List Items Addressed This Visit      Other   KNEE PAIN, BILATERAL    Severe, bilateral and worse on the L  Joint inj done 9/29 in [place]  Next visit to ortho plans both  Continues to work on wt loss  May consider knee repl in the future      Encounter for chronic pain management - Primary    Pt takes norco 5-325 mg 1 pill up to every 6 hours prn and gets 90 pills per mo  utd drug screen/contract 5/21 No red flags with [facility] 3 px written to refill 10/26, 11/26, then 12.26  Continue to monitor Sees orthopedics for knee/shoulder injections for chronic pain as well            Follow Up Instructions: I sent in your norco px  No changes  Take care     I discussed the assessment and treatment plan with the patient. The patient was provided an opportunity to ask questions and all were answered. The patient agreed with the plan and demonstrated an understanding of the instructions.   The patient was advised to call back or seek an in-person evaluation if the symptoms worsen or if the condition fails to improve as anticipated.  I provided 13 minutes of non-face-to-face time during this encounter.   Roxy Manns, MD

## 2019-11-09 NOTE — Patient Instructions (Signed)
I sent in your norco px  No changes  Take care

## 2019-11-13 ENCOUNTER — Telehealth: Payer: PPO | Admitting: Family Medicine

## 2019-11-13 DIAGNOSIS — H3552 Pigmentary retinal dystrophy: Secondary | ICD-10-CM | POA: Diagnosis not present

## 2019-11-25 ENCOUNTER — Telehealth: Payer: Self-pay | Admitting: Family Medicine

## 2019-11-25 DIAGNOSIS — I10 Essential (primary) hypertension: Secondary | ICD-10-CM

## 2019-11-25 DIAGNOSIS — E7800 Pure hypercholesterolemia, unspecified: Secondary | ICD-10-CM

## 2019-11-25 DIAGNOSIS — Z0000 Encounter for general adult medical examination without abnormal findings: Secondary | ICD-10-CM

## 2019-11-25 DIAGNOSIS — R7309 Other abnormal glucose: Secondary | ICD-10-CM

## 2019-11-25 NOTE — Telephone Encounter (Signed)
-----   Message from Aquilla Solian, RT sent at 11/13/2019  1:31 PM EDT ----- Regarding: Lab Orders for Monday 11.8.2021 Please place lab orders for Monday 11.8.2021, office visit for physical on Monday 11.15.2021 Thank you, Jones Bales RT(R)

## 2019-11-26 ENCOUNTER — Other Ambulatory Visit: Payer: Self-pay

## 2019-11-26 ENCOUNTER — Other Ambulatory Visit (INDEPENDENT_AMBULATORY_CARE_PROVIDER_SITE_OTHER): Payer: PPO

## 2019-11-26 DIAGNOSIS — R7309 Other abnormal glucose: Secondary | ICD-10-CM

## 2019-11-26 DIAGNOSIS — I10 Essential (primary) hypertension: Secondary | ICD-10-CM | POA: Diagnosis not present

## 2019-11-26 DIAGNOSIS — E7800 Pure hypercholesterolemia, unspecified: Secondary | ICD-10-CM

## 2019-11-26 LAB — CBC WITH DIFFERENTIAL/PLATELET
Basophils Absolute: 0.1 10*3/uL (ref 0.0–0.1)
Basophils Relative: 0.8 % (ref 0.0–3.0)
Eosinophils Absolute: 0.1 10*3/uL (ref 0.0–0.7)
Eosinophils Relative: 1.7 % (ref 0.0–5.0)
HCT: 39 % (ref 36.0–46.0)
Hemoglobin: 13.2 g/dL (ref 12.0–15.0)
Lymphocytes Relative: 23.3 % (ref 12.0–46.0)
Lymphs Abs: 1.9 10*3/uL (ref 0.7–4.0)
MCHC: 33.7 g/dL (ref 30.0–36.0)
MCV: 87.6 fl (ref 78.0–100.0)
Monocytes Absolute: 0.5 10*3/uL (ref 0.1–1.0)
Monocytes Relative: 6.3 % (ref 3.0–12.0)
Neutro Abs: 5.6 10*3/uL (ref 1.4–7.7)
Neutrophils Relative %: 67.9 % (ref 43.0–77.0)
Platelets: 281 10*3/uL (ref 150.0–400.0)
RBC: 4.46 Mil/uL (ref 3.87–5.11)
RDW: 13.7 % (ref 11.5–15.5)
WBC: 8.2 10*3/uL (ref 4.0–10.5)

## 2019-11-26 LAB — COMPREHENSIVE METABOLIC PANEL
ALT: 17 U/L (ref 0–35)
AST: 14 U/L (ref 0–37)
Albumin: 4.3 g/dL (ref 3.5–5.2)
Alkaline Phosphatase: 74 U/L (ref 39–117)
BUN: 16 mg/dL (ref 6–23)
CO2: 33 mEq/L — ABNORMAL HIGH (ref 19–32)
Calcium: 9.7 mg/dL (ref 8.4–10.5)
Chloride: 98 mEq/L (ref 96–112)
Creatinine, Ser: 0.7 mg/dL (ref 0.40–1.20)
GFR: 96.04 mL/min (ref >60.00)
Glucose, Bld: 94 mg/dL (ref 70–99)
Potassium: 4.8 mEq/L (ref 3.5–5.1)
Sodium: 138 mEq/L (ref 135–145)
Total Bilirubin: 0.6 mg/dL (ref 0.2–1.2)
Total Protein: 6.9 g/dL (ref 6.0–8.3)

## 2019-11-26 LAB — LIPID PANEL
Cholesterol: 217 mg/dL — ABNORMAL HIGH (ref 0–200)
HDL: 44.6 mg/dL (ref >39.00)
LDL Cholesterol: 137 mg/dL — ABNORMAL HIGH (ref 0–99)
NonHDL: 172.64
Total CHOL/HDL Ratio: 5
Triglycerides: 177 mg/dL — ABNORMAL HIGH (ref 0.0–149.0)
VLDL: 35.4 mg/dL (ref 0.0–40.0)

## 2019-11-26 LAB — HEMOGLOBIN A1C: Hgb A1c MFr Bld: 5.9 % (ref 4.6–6.5)

## 2019-11-26 LAB — TSH: TSH: 1.53 u[IU]/mL (ref 0.35–4.50)

## 2019-12-03 ENCOUNTER — Encounter: Payer: Self-pay | Admitting: Family Medicine

## 2019-12-03 ENCOUNTER — Ambulatory Visit (INDEPENDENT_AMBULATORY_CARE_PROVIDER_SITE_OTHER): Payer: PPO | Admitting: Family Medicine

## 2019-12-03 ENCOUNTER — Other Ambulatory Visit: Payer: Self-pay

## 2019-12-03 VITALS — BP 114/68 | HR 68 | Temp 96.9°F | Ht 60.5 in | Wt 303.5 lb

## 2019-12-03 DIAGNOSIS — E7800 Pure hypercholesterolemia, unspecified: Secondary | ICD-10-CM

## 2019-12-03 DIAGNOSIS — Z23 Encounter for immunization: Secondary | ICD-10-CM | POA: Diagnosis not present

## 2019-12-03 DIAGNOSIS — I10 Essential (primary) hypertension: Secondary | ICD-10-CM

## 2019-12-03 DIAGNOSIS — F341 Dysthymic disorder: Secondary | ICD-10-CM | POA: Diagnosis not present

## 2019-12-03 DIAGNOSIS — Z0000 Encounter for general adult medical examination without abnormal findings: Secondary | ICD-10-CM | POA: Diagnosis not present

## 2019-12-03 DIAGNOSIS — R7309 Other abnormal glucose: Secondary | ICD-10-CM | POA: Diagnosis not present

## 2019-12-03 MED ORDER — MONTELUKAST SODIUM 10 MG PO TABS: ORAL_TABLET | ORAL | 3 refills | Status: DC

## 2019-12-03 MED ORDER — SERTRALINE HCL 50 MG PO TABS
50.0000 mg | ORAL_TABLET | Freq: Every day | ORAL | 3 refills | Status: DC
Start: 2019-12-03 — End: 2021-01-21

## 2019-12-03 MED ORDER — OMEPRAZOLE 40 MG PO CPDR: DELAYED_RELEASE_CAPSULE | ORAL | 3 refills | Status: DC

## 2019-12-03 MED ORDER — TOLTERODINE TARTRATE ER 4 MG PO CP24
4.0000 mg | ORAL_CAPSULE | Freq: Every day | ORAL | 3 refills | Status: DC | PRN
Start: 2019-12-03 — End: 2020-12-22

## 2019-12-03 MED ORDER — LISINOPRIL 5 MG PO TABS
5.0000 mg | ORAL_TABLET | Freq: Every morning | ORAL | 3 refills | Status: DC
Start: 2019-12-03 — End: 2021-01-21

## 2019-12-03 NOTE — Assessment & Plan Note (Signed)
In obese female, watching for diabetes Lab Results  Component Value Date   HGBA1C 5.9 11/26/2019   disc imp of low glycemic diet and wt loss to prevent DM2  Commended re: wt loss so far

## 2019-12-03 NOTE — Patient Instructions (Addendum)
Call us when you are ready to schedule a colonoscopy   If you are interested in the new shingles vaccine (Shingrix) - call your local pharmacy to check on coverage and availability  If affordable, get on a wait list at your pharmacy to get the vaccine.  Please work on power of attorney - see the blue booklet    For cholesterol  Avoid red meat/ fried foods/ egg yolks/ fatty breakfast meats/ butter, cheese and high fat dairy/ and shellfish   We will consider cholesterol medication next time if not better

## 2019-12-03 NOTE — Assessment & Plan Note (Signed)
Reviewed health habits including diet and exercise and skin cancer prevention Reviewed appropriate screening tests for age  Also reviewed health mt list, fam hx and immunization status , as well as social and family history   See HPI Labs reviewed  Mammogram scheduled 12/07/19 Flu vaccine given  Interested in shingrix if covered  No falls/fx  Given material to work on adv directive No cognitive concerns Nl hearing screen  Legally blind from retinitis pigmentosa  

## 2019-12-03 NOTE — Assessment & Plan Note (Signed)
Doing well with sertraline as long as she does not miss a dose Reviewed stressors/ coping techniques/symptoms/ support sources/ tx options and side effects in detail today

## 2019-12-03 NOTE — Assessment & Plan Note (Signed)
Reviewed health habits including diet and exercise and skin cancer prevention Reviewed appropriate screening tests for age  Also reviewed health mt list, fam hx and immunization status , as well as social and family history   See HPI Labs reviewed  Mammogram scheduled 12/07/19 Flu vaccine given  Interested in shingrix if covered  No falls/fx  Given material to work on [employer] directive No cognitive concerns Nl hearing screen  Legally blind from retinitis pigmentosa

## 2019-12-03 NOTE — Assessment & Plan Note (Signed)
Discussed how this problem influences overall health and the risks it imposes  Reviewed plan for weight loss with lower calorie diet (via better food choices and also portion control or program like weight watchers) and exercise building up to or more than 30 minutes 5 days per week including some aerobic activity   Commended on 20 lb wt loss so far

## 2019-12-03 NOTE — Assessment & Plan Note (Signed)
bp in fair control at this time  BP Readings from Last 1 Encounters:  12/03/19 114/68   No changes needed Most recent labs reviewed  Disc lifstyle change with low sodium diet and exercise  Plan to continue lisinopril 5 mg daily

## 2019-12-03 NOTE — Progress Notes (Signed)
Subjective:    Patient ID: Monica Madden, female    DOB: 09/18/1960, 59 y.o.   MRN: [id number]  This visit occurred during the SARS-CoV-2 public health emergency.  Safety protocols were in place, including screening questions prior to the visit, additional usage of staff PPE, and extensive cleaning of exam room while observing appropriate contact time as indicated for disinfecting solutions.    HPI Pt presents for amw and health mt visit with rev of chronic health problems   I have personally reviewed the Medicare Annual Wellness questionnaire and have noted 1. The patient's medical and social history 2. Their use of alcohol, tobacco or illicit drugs 3. Their current medications and supplements 4. The patient's functional ability including ADL's, fall risks, home safety risks and hearing or visual             impairment. 5. Diet and physical activities 6. Evidence for depression or mood disorders  The patients weight, height, BMI have been recorded in the chart and visual acuity is per eye clinic.  I have made referrals, counseling and provided education to the patient based review of the above and I have provided the pt with a written personalized care plan for preventive services. Reviewed and updated provider list, see scanned forms.  See scanned forms.  Routine anticipatory guidance given to patient.  See health maintenance. Colon cancer screening  Colonoscopy 9/11 She is not ready for now - also cannot do ifob kit due to being blind  Breast cancer screening  Mammogram 11/20 (this is scheduled on 11/19)  Self breast exam-no lumps or changes Flu vaccine-today Tetanus vaccine Tdap 10/16  Zoster vaccine-interested in shingrix if affordable (she will call [facility])  covid vaccination- just had booster of moderna (sore arm otherwise did fine)   Falls none  Fractures-none  Supplements-taking vit D and ca  Exercise -limited walking/cane   Advance directive- has living will  (given materials to work on power of attorney) Cognitive function addressed- see scanned forms- and if abnormal then additional documentation follows.  No change in memory or concentration  Takes co Q 10 and she thinks this helps   PMH and SH reviewed  Meds, vitals, and allergies reviewed.   ROS: See HPI.  Otherwise negative.    Weight : Wt Readings from Last 3 Encounters:  12/03/19 (!) 303 lb 8 oz (137.7 kg)  12/01/18 (!) 320 lb 3 oz (145.2 kg)  02/09/18 (!) 304 lb 4 oz (138 kg)   58.30 kg/m Has lost weight - 20 lb  Has been watching her diet more   Hearing/vision:  Hearing Screening   125Hz 250Hz 500Hz 1000Hz 2000Hz 3000Hz 4000Hz 6000Hz 8000Hz  Right ear:   40 40 40  40    Left ear:   40 40 40  40    Vision Screening Comments: Legally blind retinitis pigmentosa- can see shadows only   Last few weeks joints hurt due to weather change  Had shots in knee/shoulders  Care team: Reilyn Nelson- pcp  Gaines-orthopedics  Raul Del -pulmonary   HTN bp is stable today  No cp or palpitations or headaches or edema  No side effects to medicines  BP Readings from Last 3 Encounters:  12/03/19 114/68  12/01/18 128/82  02/09/18 128/74    Takes lisinopril 5 mg daily   Pulse Readings from Last 3 Encounters:  12/03/19 68  12/01/18 82  02/09/18 67    Takes sertraline for depression with anxiety  Stable as long as she  takes it    Takes norco for chronic pain  Is utd with visits and UTD drug screen in may   Elevated glucose Lab Results  Component Value Date   HGBA1C 5.9 11/26/2019   was 5.6   Hyperlipidemia Lab Results  Component Value Date   CHOL 217 (H) 11/26/2019   CHOL 208 (H) 11/24/2018   CHOL 199 11/22/2017   Lab Results  Component Value Date   HDL 44.60 11/26/2019   HDL 41.40 11/24/2018   HDL 39.20 11/22/2017   Lab Results  Component Value Date   LDLCALC 137 (H) 11/26/2019   LDLCALC 124 (H) 11/10/2016   LDLCALC 126 (H) 10/23/2014   Lab Results  Component  Value Date   TRIG 177.0 (H) 11/26/2019   TRIG 212.0 (H) 11/24/2018   TRIG 272.0 (H) 11/22/2017   Lab Results  Component Value Date   CHOLHDL 5 11/26/2019   CHOLHDL 5 11/24/2018   CHOLHDL 5 11/22/2017   Lab Results  Component Value Date   LDLDIRECT 140.0 11/24/2018   LDLDIRECT 135.0 11/22/2017   LDLDIRECT 139.0 11/11/2015  LDL is up  Trig down  Has family hx of CAD   Eating better with her daughter cooking vegetarian  Not a lot of fried foods Once in a while - hamburger (very infrequent)  Loves sweets   Other labs Results for orders placed or performed in visit on 11/26/19  TSH  Result Value Ref Range   TSH 1.53 0.35 - 4.50 uIU/mL  Lipid panel  Result Value Ref Range   Cholesterol 217 (H) 0 - 200 mg/dL   Triglycerides 177.0 (H) 0 - 149 mg/dL   HDL 44.60 >39.00 mg/dL   VLDL 35.4 0.0 - 40.0 mg/dL   LDL Cholesterol 137 (H) 0 - 99 mg/dL   Total CHOL/HDL Ratio 5    NonHDL 172.64   Hemoglobin A1c  Result Value Ref Range   Hgb A1c MFr Bld 5.9 4.6 - 6.5 %  Comprehensive metabolic panel  Result Value Ref Range   Sodium 138 135 - 145 mEq/L   Potassium 4.8 3.5 - 5.1 mEq/L   Chloride 98 96 - 112 mEq/L   CO2 33 (H) 19 - 32 mEq/L   Glucose, Bld 94 70 - 99 mg/dL   BUN 16 6 - 23 mg/dL   Creatinine, Ser 0.70 0.40 - 1.20 mg/dL   Total Bilirubin 0.6 0.2 - 1.2 mg/dL   Alkaline Phosphatase 74 39 - 117 U/L   AST 14 0 - 37 U/L   ALT 17 0 - 35 U/L   Total Protein 6.9 6.0 - 8.3 g/dL   Albumin 4.3 3.5 - 5.2 g/dL   GFR 96.04 >60.00 mL/min   Calcium 9.7 8.4 - 10.5 mg/dL  CBC with Differential/Platelet  Result Value Ref Range   WBC 8.2 4.0 - 10.5 K/uL   RBC 4.46 3.87 - 5.11 Mil/uL   Hemoglobin 13.2 12.0 - 15.0 g/dL   HCT 39.0 36 - 46 %   MCV 87.6 78.0 - 100.0 fl   MCHC 33.7 30.0 - 36.0 g/dL   RDW 13.7 11.5 - 15.5 %   Platelets 281.0 150 - 400 K/uL   Neutrophils Relative % 67.9 43 - 77 %   Lymphocytes Relative 23.3 12 - 46 %   Monocytes Relative 6.3 3 - 12 %   Eosinophils  Relative 1.7 0 - 5 %   Basophils Relative 0.8 0 - 3 %   Neutro Abs 5.6 1.4 - 7.7 K/uL  Lymphs Abs 1.9 0.7 - 4.0 K/uL   Monocytes Absolute 0.5 0.1 - 1.0 K/uL   Eosinophils Absolute 0.1 0.0 - 0.7 K/uL   Basophils Absolute 0.1 0.0 - 0.1 K/uL     Patient Active Problem List   Diagnosis Date Noted  . Medicare annual wellness visit, subsequent 12/01/2018  . Encounter for chronic pain management 02/07/2017  . Chronic pain 11/17/2015  . Elevated glucose level 11/09/2015  . Routine general medical examination at a health care facility 06/30/2011  . Amenorrhea 06/30/2011  . Adverse effects of medication 07/01/2010  . DYSPEPSIA 09/26/2009  . IRRITABLE BOWEL SYNDROME 08/07/2009  . KNEE PAIN, BILATERAL 08/07/2009  . CONSTIPATION 01/15/2009  . LEG PAIN, LEFT 10/09/2008  . Hyperlipidemia 05/09/2008  . Essential hypertension 03/29/2008  . 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 06/05/2007  . INCI HERNIA WITHOUT MENTION OBSTRUCTION/GANGRENE 05/12/2007  . FREQUENCY, URINARY 05/12/2007  . ARTHRALGIA 11/03/2006  . DEPENDENT EDEMA, LEGS 11/03/2006  . Morbid obesity ([place]) 07/15/2006  . ANXIETY DEPRESSION 07/15/2006  . MIGRAINE HEADACHE 07/15/2006  . RETINITIS PIGMENTOSA 07/15/2006  . VARICOSE VEINS, LOWER EXTREMITIES 07/15/2006  . ALLERGIC RHINITIS 07/15/2006  . ASTHMA 07/15/2006  . GERD 07/15/2006  . BACK PAIN, LUMBAR, CHRONIC 07/15/2006  . INCONTINENCE, URGE 07/15/2006   Past Medical History:  Diagnosis Date  . Allergy    allergic rhinitis  . Anemia   . Anxiety    ATTACKS  . Arthritis   . Asthma   . Cataract    left cataract removal 12/2008  . Chronic back pain    OA of spine  . Depression   . GERD (gastroesophageal reflux disease)    Endo negative 02/2000  . Hypertension 03/2008  . IBS (irritable bowel syndrome)   . MRSA infection 2008   Right leg  . Obesity   . Ovarian cyst   . Retinitis pigmentosa    blind   Past Surgical History:  Procedure Laterality Date   . APPENDECTOMY    . BLADDER SURGERY  1968   age 70 ? dilitation  . CATARACT EXTRACTION W/PHACO Right 11/11/2014   Procedure: CATARACT EXTRACTION PHACO AND INTRAOCULAR LENS PLACEMENT (IOC);  Surgeon: Estill Cotta, MD;  Location: [facility] ORS;  Service: Ophthalmology;  Laterality: Right;  LOT PACK: [id number] H [place]: 00:29.7 AP: 11.2 CDE: 7.14  . CESAREAN SECTION    . CHOLECYSTECTOMY    . CYSTOSCOPY  03/2008   negative// Dr. Reece Agar urologist  . ENDOMETRIAL BIOPSY  07/1998   negative  . EYE SURGERY  12/2008   cataract removal left  . HERNIA REPAIR  05/2006   ruptured hernia repair after fall and then two more surgeries within as many weeks for [id number]) Umbilical hernia repair (06/1997)  . KNEE ARTHROSCOPY W/ MENISCAL REPAIR     left knne   Social History   Tobacco Use  . Smoking status: Never Smoker  . Smokeless tobacco: Never Used  Substance Use Topics  . Alcohol use: Yes    Comment: twice a year  . Drug use: Yes    Types: Marijuana   Family History  Problem Relation Age of Onset  . Depression Mother   . Heart disease Father   . Diabetes Maternal Uncle   . Hypertension Maternal Grandmother   . Depression Brother        Depression secondary to MVA injuries  . Hypertension Brother   . Heart disease Paternal Grandfather   . Heart disease Paternal Grandmother    Allergies  Allergen Reactions  .  Diclofenac Sodium     REACTION: allergic/ made asthma bad  . Etodolac     REACTION: GI  . Metronidazole     REACTION: GI side eff  . Nsaids Other (See Comments)    GI Upset  . Oxybutynin Itching  . Sulfamethoxazole-Trimethoprim   . Tolterodine Tartrate     REACTION: None Effective  . Tramadol Other (See Comments)    Caused elevated BP  . Sulfa Antibiotics Itching and Rash   Current Outpatient Medications on File Prior to Visit  Medication Sig Dispense Refill  . Albuterol Sulfate (PROAIR HFA IN) Inhale 2 puffs into the lungs every 6 (six) hours as needed.     . ALLERGY RELIEF 180 MG tablet TAKE 1 TABLET BY MOUTH EVERY DAY 90 tablet 1  . ALPRAZolam (XANAX) 1 MG tablet TAKE 1 TABLET(1 MG) BY MOUTH THREE TIMES DAILY AS NEEDED 90 tablet 3  . Ascorbic Acid (VITAMIN C) 1000 MG tablet Take 1,000 mg by mouth daily.      . Black Elderberry,Berry-Flower, 575 MG CAPS Take 1 capsule by mouth daily.    . Calcium Carbonate-Vitamin D (CALCIUM 600-D) 600-400 MG-UNIT tablet Take 1 tablet by mouth daily.    . Cholecalciferol (VITAMIN D3 PO) Take 1,000 Units by mouth daily.    . Coenzyme Q10 (CO Q 10 PO) Take 1 tablet by mouth daily.    . cyclobenzaprine (FLEXERIL) 10 MG tablet Take 10 mg by mouth 3 (three) times daily as needed for muscle spasms.    . fluticasone (FLONASE) 50 MCG/ACT nasal spray INSTILL 2 SPRAYS INTO EACH NOSTRIL TWICE A DAY (Patient taking differently: INSTILL 2 SPRAYS INTO EACH NOSTRIL ONCE A DAY) 48 g 3  . HYDROcodone-acetaminophen (NORCO) 5-325 MG tablet Take 1 tablet by mouth every 6 (six) hours as needed for moderate pain or severe pain. Do not fill before 01/13/20 90 tablet 0  . HYDROcodone-acetaminophen (NORCO) 5-325 MG tablet Take 1 tablet by mouth every 6 (six) hours as needed for moderate pain or severe pain. Do not fill before 12/14/19 90 tablet 0  . HYDROcodone-acetaminophen (NORCO/VICODIN) 5-325 MG tablet Take 1 tablet by mouth every 6 (six) hours as needed for moderate pain. Do not fill before 11/13/19 90 tablet 0  . Omega 3-6-9 Fatty Acids (OMEGA 3-6-9 COMPLEX PO) Take one tablet by mouth daily    . OVER THE COUNTER MEDICATION daily as needed. HYLAND'S CELL SALTS #8 MAGNESIA PHOSPHORICA     No current facility-administered medications on file prior to visit.    Review of Systems  Constitutional: Negative for activity change, appetite change, fatigue, fever and unexpected weight change.  HENT: Negative for congestion, ear pain, rhinorrhea, sinus pressure and sore throat.   Eyes: Positive for visual disturbance. Negative for pain and  redness.  Respiratory: Negative for cough, shortness of breath and wheezing.   Cardiovascular: Negative for chest pain and palpitations.  Gastrointestinal: Negative for abdominal pain, blood in stool, constipation and diarrhea.  Endocrine: Negative for polydipsia and polyuria.  Genitourinary: Negative for dysuria, frequency and urgency.  Musculoskeletal: Positive for arthralgias and gait problem. Negative for back pain and myalgias.  Skin: Negative for pallor and rash.  Allergic/Immunologic: Negative for environmental allergies.  Neurological: Negative for dizziness, syncope and headaches.  Hematological: Negative for adenopathy. Does not bruise/bleed easily.  Psychiatric/Behavioral: Negative for decreased concentration and dysphoric mood. The patient is not nervous/anxious.        Objective:   Physical Exam Constitutional:      General:  She is not in acute distress.    Appearance: Normal appearance. She is well-developed. She is obese. She is not ill-appearing or diaphoretic.     Comments: Exam done sitting in chair   HENT:     Head: Normocephalic and atraumatic.     Right Ear: Tympanic membrane, ear canal and external ear normal.     Left Ear: Tympanic membrane, ear canal and external ear normal.     Nose: Nose normal. No congestion.     Mouth/Throat:     Mouth: Mucous membranes are moist.     Pharynx: Oropharynx is clear. No posterior oropharyngeal erythema.  Eyes:     General: No scleral icterus.    Extraocular Movements: Extraocular movements intact.     Conjunctiva/sclera: Conjunctivae normal.     Pupils: Pupils are equal, round, and reactive to light.  Neck:     Thyroid: No thyromegaly.     Vascular: No carotid bruit or JVD.  Cardiovascular:     Rate and Rhythm: Normal rate and regular rhythm.     Pulses: Normal pulses.     Heart sounds: Normal heart sounds. No gallop.   Pulmonary:     Effort: Pulmonary effort is normal. No respiratory distress.     Breath sounds:  Normal breath sounds. No wheezing.     Comments: Good air exch Chest:     Chest wall: No tenderness.  Abdominal:     General: Bowel sounds are normal. There is no distension or abdominal bruit.     Palpations: Abdomen is soft. There is no mass.     Tenderness: There is no abdominal tenderness.     Hernia: No hernia is present.  Genitourinary:    Comments: Breast exam: No mass, nodules, thickening, tenderness, bulging, retraction, inflamation, nipple discharge or skin changes noted.  No axillary or clavicular LA.     Exam done sitting Musculoskeletal:        General: No tenderness. Normal range of motion.     Cervical back: Normal range of motion and neck supple. No rigidity. No muscular tenderness.     Right lower leg: No edema.     Left lower leg: No edema.  Lymphadenopathy:     Cervical: No cervical adenopathy.  Skin:    General: Skin is warm and dry.     Coloration: Skin is not pale.     Findings: No erythema or rash.     Comments: Lentigines and skin tags  Neurological:     Mental Status: She is alert. Mental status is at baseline.     Cranial Nerves: No cranial nerve deficit.     Motor: No abnormal muscle tone.     Coordination: Coordination normal.     Gait: Gait normal.     Deep Tendon Reflexes: Reflexes are normal and symmetric. Reflexes normal.  Psychiatric:        Mood and Affect: Mood normal.        Cognition and Memory: Cognition and memory normal.           Assessment & Plan:   Problem List Items Addressed This Visit      Cardiovascular and Mediastinum   Essential hypertension    bp in fair control at this time  BP Readings from Last 1 Encounters:  12/03/19 114/68   No changes needed Most recent labs reviewed  Disc lifstyle change with low sodium diet and exercise  Plan to continue lisinopril 5 mg daily  Relevant Medications   lisinopril (ZESTRIL) 5 MG tablet     Other   Hyperlipidemia    Disc goals for lipids and reasons to control  them Rev last labs with pt Rev low sat fat diet in detail LDL up into 130s Pt declines statin medication for now  Will watch diet and re check        Relevant Medications   lisinopril (ZESTRIL) 5 MG tablet   Morbid obesity (HCC)    Discussed how this problem influences overall health and the risks it imposes  Reviewed plan for weight loss with lower calorie diet (via better food choices and also portion control or program like weight watchers) and exercise building up to or more than 30 minutes 5 days per week including some aerobic activity   Commended on 20 lb wt loss so far      ANXIETY DEPRESSION    Doing well with sertraline as long as she does not miss a dose Reviewed stressors/ coping techniques/symptoms/ support sources/ tx options and side effects in detail today       Relevant Medications   sertraline (ZOLOFT) 50 MG tablet   Routine general medical examination at a health care facility    Reviewed health habits including diet and exercise and skin cancer prevention Reviewed appropriate screening tests for age  Also reviewed health mt list, fam hx and immunization status , as well as social and family history   See HPI Labs reviewed  Mammogram scheduled 12/07/19 Flu vaccine given  Interested in shingrix if covered  No falls/fx  Given material to work on [employer] directive No cognitive concerns Nl hearing screen  Legally blind from retinitis pigmentosa       Elevated glucose level    In obese female, watching for diabetes Lab Results  Component Value Date   HGBA1C 5.9 11/26/2019   disc imp of low glycemic diet and wt loss to prevent DM2  Commended re: wt loss so far      Medicare annual wellness visit, subsequent - Primary    Reviewed health habits including diet and exercise and skin cancer prevention Reviewed appropriate screening tests for age  Also reviewed health mt list, fam hx and immunization status , as well as social and family history   See  HPI Labs reviewed  Mammogram scheduled 12/07/19 Flu vaccine given  Interested in shingrix if covered  No falls/fx  Given material to work on [employer] directive No cognitive concerns Nl hearing screen  Legally blind from retinitis pigmentosa

## 2019-12-03 NOTE — Assessment & Plan Note (Signed)
Disc goals for lipids and reasons to control them Rev last labs with pt Rev low sat fat diet in detail LDL up into 130s Pt declines statin medication for now  Will watch diet and re check

## 2019-12-04 MED ORDER — ROSUVASTATIN CALCIUM 5 MG PO TABS: 5.0000 mg | ORAL_TABLET | Freq: Every day | ORAL | 11 refills | Status: DC

## 2019-12-07 ENCOUNTER — Ambulatory Visit
Admission: RE | Admit: 2019-12-07 | Discharge: 2019-12-07 | Disposition: A | Discharge status: Home / Self Care | Payer: PPO | Source: Ambulatory Visit | Attending: Family Medicine | Admitting: Family Medicine

## 2019-12-07 ENCOUNTER — Other Ambulatory Visit: Payer: Self-pay

## 2019-12-07 DIAGNOSIS — Z1231 Encounter for screening mammogram for malignant neoplasm of breast: Secondary | ICD-10-CM | POA: Diagnosis not present

## 2020-02-11 ENCOUNTER — Encounter: Payer: Self-pay | Admitting: Family Medicine

## 2020-02-11 ENCOUNTER — Telehealth (INDEPENDENT_AMBULATORY_CARE_PROVIDER_SITE_OTHER): Payer: PPO | Admitting: Family Medicine

## 2020-02-11 ENCOUNTER — Other Ambulatory Visit: Payer: Self-pay | Admitting: Family Medicine

## 2020-02-11 DIAGNOSIS — G8929 Other chronic pain: Secondary | ICD-10-CM

## 2020-02-11 MED ORDER — HYDROCODONE-ACETAMINOPHEN 5-325 MG PO TABS: 1.0000 | ORAL_TABLET | Freq: Four times a day (QID) | ORAL | 0 refills | Status: DC | PRN

## 2020-02-11 NOTE — Progress Notes (Signed)
Virtual Visit via Telephone Note  I connected with [name] on 02/11/20 at 11:00 AM EST by telephone and verified that I am speaking with the correct person using two identifiers.  Location: Patient: home Provider: office    I discussed the limitations, risks, security and privacy concerns of performing an evaluation and management service by telephone and the availability of in person appointments. I also discussed with the patient that there may be a patient responsible charge related to this service. The patient expressed understanding and agreed to proceed.  Parties involved in encounter  Patient: Monica Madden  Provider:  Roxy Manns MD   History of Present Illness:  Pt presents for routine encounter for chronic pain management  'feeling fair overall  Staying in with covid   With weather changes- more pain in L knee and R shoulder = very painful  Thinks she needs steroid shot in R shoulder    Indication for chronic opioid: severe OA of knees and shoulders Plans to f/u with orthopedics soon (due)   Medication and dose: norco 5-325 mg every 6 hours prn Also alprazolam 1 mg -last refilled 12/27   # pills per month: 90   Last UDS date: 05/30/19   Opioid Treatment Agreement signed (Y/N): yes  Opioid Treatment Agreement last reviewed with patient:  05/30/19  NCCSRS reviewed this encounter (include red flags):  tpday no red flags   Noted last norco px was filled 01/14/20  Since last visit she has done well with crestor-no side effects     Patient Active Problem List   Diagnosis Date Noted  . Medicare annual wellness visit, subsequent 12/01/2018  . Encounter for chronic pain management 02/07/2017  . Chronic pain 11/17/2015  . Elevated glucose level 11/09/2015  . Routine general medical examination at a health care facility 06/30/2011  . Amenorrhea 06/30/2011  . Adverse effects of medication 07/01/2010  . DYSPEPSIA 09/26/2009  . IRRITABLE BOWEL SYNDROME  08/07/2009  . KNEE PAIN, BILATERAL 08/07/2009  . CONSTIPATION 01/15/2009  . LEG PAIN, LEFT 10/09/2008  . Hyperlipidemia 05/09/2008  . Essential hypertension 03/29/2008  . 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 06/05/2007  . INCI HERNIA WITHOUT MENTION OBSTRUCTION/GANGRENE 05/12/2007  . FREQUENCY, URINARY 05/12/2007  . ARTHRALGIA 11/03/2006  . DEPENDENT EDEMA, LEGS 11/03/2006  . Morbid obesity (HCC) 07/15/2006  . ANXIETY DEPRESSION 07/15/2006  . MIGRAINE HEADACHE 07/15/2006  . RETINITIS PIGMENTOSA 07/15/2006  . VARICOSE VEINS, LOWER EXTREMITIES 07/15/2006  . ALLERGIC RHINITIS 07/15/2006  . ASTHMA 07/15/2006  . GERD 07/15/2006  . BACK PAIN, LUMBAR, CHRONIC 07/15/2006  . INCONTINENCE, URGE 07/15/2006   Past Medical History:  Diagnosis Date  . Allergy    allergic rhinitis  . Anemia   . Anxiety    ATTACKS  . Arthritis   . Asthma   . Cataract    left cataract removal 12/2008  . Chronic back pain    OA of spine  . Depression   . GERD (gastroesophageal reflux disease)    Endo negative 02/2000  . Hypertension 03/2008  . IBS (irritable bowel syndrome)   . MRSA infection 2008   Right leg  . Obesity   . Ovarian cyst   . Retinitis pigmentosa    blind   Past Surgical History:  Procedure Laterality Date  . APPENDECTOMY    . BLADDER SURGERY  1968   age 82 ? dilitation  . CATARACT EXTRACTION W/PHACO Right 11/11/2014   Procedure: CATARACT EXTRACTION PHACO AND INTRAOCULAR LENS PLACEMENT (IOC);  Surgeon: Sallee Lange, MD;  Location: [facility] ORS;  Service: Ophthalmology;  Laterality: Right;  LOT PACK: [id number] H [place]: 00:29.7 AP: 11.2 CDE: 7.14  . CESAREAN SECTION    . CHOLECYSTECTOMY    . CYSTOSCOPY  03/2008   negative// Dr. Wanda Plump urologist  . ENDOMETRIAL BIOPSY  07/1998   negative  . EYE SURGERY  12/2008   cataract removal left  . HERNIA REPAIR  05/2006   ruptured hernia repair after fall and then two more surgeries within as many weeks for  complications(05/2006) Umbilical hernia repair (06/1997)  . KNEE ARTHROSCOPY W/ MENISCAL REPAIR     left knne   Social History   Tobacco Use  . Smoking status: Never Smoker  . Smokeless tobacco: Never Used  Substance Use Topics  . Alcohol use: Yes    Comment: twice a year  . Drug use: Yes    Types: Marijuana   Family History  Problem Relation Age of Onset  . Depression Mother   . Heart disease Father   . Diabetes Maternal Uncle   . Hypertension Maternal Grandmother   . Depression Brother        Depression secondary to MVA injuries  . Hypertension Brother   . Heart disease Paternal Grandfather   . Heart disease Paternal Grandmother    Allergies  Allergen Reactions  . Diclofenac Sodium     REACTION: allergic/ made asthma bad  . Etodolac     REACTION: GI  . Metronidazole     REACTION: GI side eff  . Nsaids Other (See Comments)    GI Upset  . Oxybutynin Itching  . Sulfamethoxazole-Trimethoprim   . Tolterodine Tartrate     REACTION: None Effective  . Tramadol Other (See Comments)    Caused elevated BP  . Sulfa Antibiotics Itching and Rash   Current Outpatient Medications on File Prior to Visit  Medication Sig Dispense Refill  . Albuterol Sulfate (PROAIR HFA IN) Inhale 2 puffs into the lungs every 6 (six) hours as needed.    . ALLERGY RELIEF 180 MG tablet TAKE 1 TABLET BY MOUTH EVERY DAY 90 tablet 1  . ALPRAZolam (XANAX) 1 MG tablet TAKE 1 TABLET(1 MG) BY MOUTH THREE TIMES DAILY AS NEEDED 90 tablet 3  . Ascorbic Acid (VITAMIN C) 1000 MG tablet Take 1,000 mg by mouth daily.    . Black Elderberry,Berry-Flower, 575 MG CAPS Take 1 capsule by mouth daily.    . Calcium Carbonate-Vitamin D 600-400 MG-UNIT tablet Take 1 tablet by mouth daily.    . Cholecalciferol (VITAMIN D3 PO) Take 1,000 Units by mouth daily.    . Coenzyme Q10 (CO Q 10 PO) Take 1 tablet by mouth daily.    . cyclobenzaprine (FLEXERIL) 10 MG tablet Take 10 mg by mouth 3 (three) times daily as needed for  muscle spasms.    . fluticasone (FLONASE) 50 MCG/ACT nasal spray INSTILL 2 SPRAYS INTO EACH NOSTRIL TWICE A DAY (Patient taking differently: INSTILL 2 SPRAYS INTO EACH NOSTRIL ONCE A DAY) 48 g 3  . HYDROcodone-acetaminophen (NORCO) 5-325 MG tablet Take 1 tablet by mouth every 6 (six) hours as needed for moderate pain or severe pain. Do not fill before 01/13/20 90 tablet 0  . HYDROcodone-acetaminophen (NORCO) 5-325 MG tablet Take 1 tablet by mouth every 6 (six) hours as needed for moderate pain or severe pain. Do not fill before 12/14/19 90 tablet 0  . HYDROcodone-acetaminophen (NORCO/VICODIN) 5-325 MG tablet Take 1 tablet by mouth every 6 (six) hours as needed  for moderate pain. Do not fill before 11/13/19 90 tablet 0  . lisinopril (ZESTRIL) 5 MG tablet Take 1 tablet (5 mg total) by mouth every morning. 90 tablet 3  . montelukast (SINGULAIR) 10 MG tablet take 1 tablet by mouth once daily 90 tablet 3  . Omega 3-6-9 Fatty Acids (OMEGA 3-6-9 COMPLEX PO) Take one tablet by mouth daily    . omeprazole (PRILOSEC) 40 MG capsule take 1 capsule by mouth once daily 90 capsule 3  . OVER THE COUNTER MEDICATION daily as needed. HYLAND'S CELL SALTS #8 MAGNESIA PHOSPHORICA    . rosuvastatin (CRESTOR) 5 MG tablet Take 1 tablet (5 mg total) by mouth daily. 90 tablet 11  . sertraline (ZOLOFT) 50 MG tablet Take 1 tablet (50 mg total) by mouth daily. 90 tablet 3  . tolterodine (DETROL LA) 4 MG 24 hr capsule Take 1 capsule (4 mg total) by mouth daily as needed. 90 capsule 3   No current facility-administered medications on file prior to visit.   Review of Systems  Constitutional: Negative for chills, fever and malaise/fatigue.  HENT: Negative for congestion, ear pain, sinus pain and sore throat.   Eyes: Negative for blurred vision, discharge and redness.  Respiratory: Negative for cough, shortness of breath and stridor.   Cardiovascular: Negative for chest pain, palpitations and leg swelling.  Gastrointestinal:  Negative for abdominal pain, diarrhea, nausea and vomiting.  Musculoskeletal: Positive for back pain and joint pain. Negative for myalgias.  Skin: Negative for rash.  Neurological: Negative for dizziness and headaches.    Observations/Objective: Pt sounds well, not distressed  Nl cognition/good historian Nl mood- cheerful and talkative   Assessment and Plan: Problem List Items Addressed This Visit      Other   Chronic pain    OA of knees and shoulders  Planning orthopedic f/u soon (gets steroid shots)  Shoulder is worse right now   Activity as tolerated Aware wt loss would help also      Encounter for chronic pain management - Primary    Stable /no significant changes in medication needs norco 5-325 mg q 6 h prn with 90 per month  Dated 3 px to fill on or after 1/26, 2/26 and 3/26 UDS due in may  utd with contract   Pain level worse with weather change (OA in knees and shoulders) but getting by with same pain med at this time Will schedule next enc in 3 mo          Follow Up Instructions: No change in medication   Will send your pain medicine px in as soon as we can  Let us know if any problems  Please schedule next pain visit for 3 months    I discussed the assessment and treatment plan with the patient. The patient was provided an opportunity to ask questions and all were answered. The patient agreed with the plan and demonstrated an understanding of the instructions.   The patient was advised to call back or seek an in-person evaluation if the symptoms worsen or if the condition fails to improve as anticipated.  I provided 17 minutes of non-face-to-face time during this encounter.   Roxy Manns, MD

## 2020-02-11 NOTE — Assessment & Plan Note (Signed)
Stable /no significant changes in medication needs norco 5-325 mg q 6 h prn with 90 per month  Dated 3 px to fill on or after 1/26, 2/26 and 3/26 UDS due in may  utd with contract   Pain level worse with weather change (OA in knees and shoulders) but getting by with same pain med at this time Will schedule next enc in 3 mo

## 2020-02-11 NOTE — Patient Instructions (Addendum)
No change in medication   Will send your pain medicine px in as soon as we can  Let us know if any problems  Please schedule next pain visit for 3 months

## 2020-02-11 NOTE — Assessment & Plan Note (Signed)
OA of knees and shoulders  Planning orthopedic f/u soon (gets steroid shots)  Shoulder is worse right now   Activity as tolerated Aware wt loss would help also

## 2020-02-12 NOTE — Telephone Encounter (Signed)
Pharmacy requests refill on: Alprazolam 1 mg   LAST REFILL: 10/15/2019 (Q-90, R-3) LAST OV: 12/03/2019 NEXT OV: 12/04/2020 PHARMACY: [id number] [place], [place]

## 2020-02-28 ENCOUNTER — Encounter: Payer: Self-pay | Admitting: Gastroenterology

## 2020-03-10 ENCOUNTER — Encounter: Payer: Self-pay | Admitting: Family Medicine

## 2020-03-10 DIAGNOSIS — M75101 Unspecified rotator cuff tear or rupture of right shoulder, not specified as traumatic: Secondary | ICD-10-CM | POA: Diagnosis not present

## 2020-03-10 DIAGNOSIS — M25561 Pain in right knee: Secondary | ICD-10-CM | POA: Diagnosis not present

## 2020-03-10 DIAGNOSIS — M25511 Pain in right shoulder: Secondary | ICD-10-CM | POA: Diagnosis not present

## 2020-03-10 DIAGNOSIS — M1712 Unilateral primary osteoarthritis, left knee: Secondary | ICD-10-CM | POA: Diagnosis not present

## 2020-03-10 DIAGNOSIS — M25512 Pain in left shoulder: Secondary | ICD-10-CM | POA: Diagnosis not present

## 2020-03-10 DIAGNOSIS — M75102 Unspecified rotator cuff tear or rupture of left shoulder, not specified as traumatic: Secondary | ICD-10-CM | POA: Diagnosis not present

## 2020-03-10 DIAGNOSIS — G8929 Other chronic pain: Secondary | ICD-10-CM | POA: Diagnosis not present

## 2020-03-12 NOTE — Telephone Encounter (Signed)
There was a form attached to this message, I printed it out and placed in your inbox

## 2020-04-24 ENCOUNTER — Other Ambulatory Visit: Payer: Self-pay | Admitting: Family Medicine

## 2020-04-28 DIAGNOSIS — H3552 Pigmentary retinal dystrophy: Secondary | ICD-10-CM | POA: Diagnosis not present

## 2020-04-29 ENCOUNTER — Encounter: Payer: Self-pay | Admitting: Family Medicine

## 2020-04-30 ENCOUNTER — Other Ambulatory Visit: Payer: Self-pay

## 2020-04-30 ENCOUNTER — Telehealth (INDEPENDENT_AMBULATORY_CARE_PROVIDER_SITE_OTHER): Payer: PPO | Admitting: Family Medicine

## 2020-04-30 ENCOUNTER — Encounter: Payer: Self-pay | Admitting: Family Medicine

## 2020-04-30 DIAGNOSIS — G8929 Other chronic pain: Secondary | ICD-10-CM

## 2020-04-30 MED ORDER — HYDROCODONE-ACETAMINOPHEN 5-325 MG PO TABS: 1.0000 | ORAL_TABLET | Freq: Four times a day (QID) | ORAL | 0 refills | Status: DC | PRN

## 2020-04-30 NOTE — Patient Instructions (Signed)
For weight loss Try to get most of your carbohydrates from produce (with the exception of white potatoes)  Eat less bread/pasta/rice/snack foods/cereals/sweets and other items from the middle of the grocery store (processed carbs)  Options for treatment include Medications like Ozempic or others of that class (if covered by insurance)  The [facility] weight [facility] wellness clinic through [place] The NOOM program on line   No change in your pain medicines today The office will call you to set up a time for urine drug screen and pain medication contract review   We will do your next chronic pain encounter in 3 months

## 2020-04-30 NOTE — Assessment & Plan Note (Signed)
Takes norco 5-325 mg up to every 6 hours prn pain  No side effects  Gets 90 per mo  Reviewed NCCSRS and no red flags Due for UDS and pain contract review again on or after 05/29/20 (will arrange for that)  Reviewed recent orthopedic notes and has more shoulder pain (rotator cuff tear and arthritis) along with baseline knee pain  Continues steroid injections  Sent px for 30 day supplies of norco Next pain encounter due in 3 mo

## 2020-04-30 NOTE — Progress Notes (Signed)
Virtual Visit via Telephone Note  I connected with Monica Madden on 04/30/20 at 12:00 PM EDT by telephone and verified that I am speaking with the correct person using two identifiers.  Location: Patient: home Provider: office   I discussed the limitations, risks, security and privacy concerns of performing an evaluation and management service by telephone and the availability of in person appointments. I also discussed with the patient that there may be a patient responsible charge related to this service. The patient expressed understanding and agreed to proceed.  Parties involved in encounter  Patient: Monica Madden  Provider:  Roxy Manns MD   History of Present Illness: Pt presents for encounter for chronic pain management   Indication for chronic opioid: severe OA knees and shoulders  Medication and dose: norco 5-325 mg every 6 hours as needed  # pills per month: 90  Last UDS date: 05/30/19  Opioid Treatment Agreement signed (Y/N): yes  Opioid Treatment Agreement last reviewed with patient:  05/30/19, today 04/30/20   NCCSRS reviewed this encounter (include red flags):  reviewed today 04/30/20 No red flags Last px filled 04/14/20 for 90 pills That px was marked not to refill before 04/12/20  No changes since last visit   Pain is about the same  Still sees orthopedics- last visit 03/10/20 Imaging found signs of old rotator cuff tear  R shoulder hurts as bad as L knee   Aware she needs a knee replacement  Has to loose weight to qualify Also not able to walk with a walker due to shoulder pain   Trying to eat better  Last weight 304 lb and bmi is 57.6   Does not think her appetite is that high  Loves sweets and snacking May be addicted to sugar   Stress makes it worse   Hard time getting out for visits Would like some information on   Was able to get out to eat once and that helped her mood   Patient Active Problem List   Diagnosis Date Noted  . Medicare  annual wellness visit, subsequent 12/01/2018  . Encounter for chronic pain management 02/07/2017  . Chronic pain 11/17/2015  . Elevated glucose level 11/09/2015  . Routine general medical examination at a health care facility 06/30/2011  . Amenorrhea 06/30/2011  . Adverse effects of medication 07/01/2010  . DYSPEPSIA 09/26/2009  . IRRITABLE BOWEL SYNDROME 08/07/2009  . KNEE PAIN, BILATERAL 08/07/2009  . CONSTIPATION 01/15/2009  . LEG PAIN, LEFT 10/09/2008  . Hyperlipidemia 05/09/2008  . Essential hypertension 03/29/2008  . 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 06/05/2007  . INCI HERNIA WITHOUT MENTION OBSTRUCTION/GANGRENE 05/12/2007  . FREQUENCY, URINARY 05/12/2007  . ARTHRALGIA 11/03/2006  . DEPENDENT EDEMA, LEGS 11/03/2006  . Morbid obesity (HCC) 07/15/2006  . ANXIETY DEPRESSION 07/15/2006  . MIGRAINE HEADACHE 07/15/2006  . RETINITIS PIGMENTOSA 07/15/2006  . VARICOSE VEINS, LOWER EXTREMITIES 07/15/2006  . ALLERGIC RHINITIS 07/15/2006  . ASTHMA 07/15/2006  . GERD 07/15/2006  . BACK PAIN, LUMBAR, CHRONIC 07/15/2006  . INCONTINENCE, URGE 07/15/2006   Past Medical History:  Diagnosis Date  . Allergy    allergic rhinitis  . Anemia   . Anxiety    ATTACKS  . Arthritis   . Asthma   . Cataract    left cataract removal 12/2008  . Chronic back pain    OA of spine  . Depression   . GERD (gastroesophageal reflux disease)    Endo negative 02/2000  . Hypertension 03/2008  .  IBS (irritable bowel syndrome)   . MRSA infection 2008   Right leg  . Obesity   . Ovarian cyst   . Retinitis pigmentosa    blind   Past Surgical History:  Procedure Laterality Date  . APPENDECTOMY    . BLADDER SURGERY  1968   age 34 ? dilitation  . CATARACT EXTRACTION W/PHACO Right 11/11/2014   Procedure: CATARACT EXTRACTION PHACO AND INTRAOCULAR LENS PLACEMENT (IOC);  Surgeon: Sallee LangeSteven Dingeldein, MD;  Location: [facility];  Service: Ophthalmology;  Laterality: Right;  LOT PACK: [id number] H [place]:  00:29.7 AP: 11.2 CDE: 7.14  . CESAREAN SECTION    . CHOLECYSTECTOMY    . CYSTOSCOPY  03/2008   negative// Dr. Wanda PlumpHumphries urologist  . ENDOMETRIAL BIOPSY  07/1998   negative  . EYE SURGERY  12/2008   cataract removal left  . HERNIA REPAIR  05/2006   ruptured hernia repair after fall and then two more surgeries within as many weeks for complications(05/2006) Umbilical hernia repair (06/1997)  . KNEE ARTHROSCOPY W/ MENISCAL REPAIR     left knne   Social History   Tobacco Use  . Smoking status: Never Smoker  . Smokeless tobacco: Never Used  Substance Use Topics  . Alcohol use: Yes    Comment: twice a year  . Drug use: Yes    Types: Marijuana   Family History  Problem Relation Age of Onset  . Depression Mother   . Heart disease Father   . Diabetes Maternal Uncle   . Hypertension Maternal Grandmother   . Depression Brother        Depression secondary to MVA injuries  . Hypertension Brother   . Heart disease Paternal Grandfather   . Heart disease Paternal Grandmother    Allergies  Allergen Reactions  . Diclofenac Sodium     REACTION: allergic/ made asthma bad  . Etodolac     REACTION: GI  . Metronidazole     REACTION: GI side eff  . Nsaids Other (See Comments)    GI Upset  . Oxybutynin Itching  . Sulfamethoxazole-Trimethoprim   . Tolterodine Tartrate     REACTION: None Effective  . Tramadol Other (See Comments)    Caused elevated BP  . Sulfa Antibiotics Itching and Rash   Current Outpatient Medications on File Prior to Visit  Medication Sig Dispense Refill  . Albuterol Sulfate (PROAIR HFA IN) Inhale 2 puffs into the lungs every 6 (six) hours as needed.    . ALLERGY RELIEF 180 MG tablet TAKE 1 TABLET BY MOUTH EVERY DAY 90 tablet 1  . ALPRAZolam (XANAX) 1 MG tablet TAKE 1 TABLET(1 MG) BY MOUTH THREE TIMES DAILY AS NEEDED 90 tablet 3  . Ascorbic Acid (VITAMIN C) 1000 MG tablet Take 1,000 mg by mouth daily.    . Black Elderberry,Berry-Flower, 575 MG CAPS Take 1  capsule by mouth daily.    . Calcium Carbonate-Vitamin D 600-400 MG-UNIT tablet Take 1 tablet by mouth daily.    . Cholecalciferol (VITAMIN D3 PO) Take 1,000 Units by mouth daily.    . Coenzyme Q10 (CO Q 10 PO) Take 1 tablet by mouth daily.    . cyclobenzaprine (FLEXERIL) 10 MG tablet Take 10 mg by mouth 3 (three) times daily as needed for muscle spasms.    . fluticasone (FLONASE) 50 MCG/ACT nasal spray INSTILL 2 SPRAYS INTO EACH NOSTRIL TWICE A DAY (Patient taking differently: INSTILL 2 SPRAYS INTO EACH NOSTRIL ONCE A DAY) 48 g 3  . HYDROcodone-acetaminophen (NORCO) 5-325 MG  tablet Take 1 tablet by mouth every 6 (six) hours as needed for moderate pain or severe pain. Do not fill before 04/12/20 90 tablet 0  . HYDROcodone-acetaminophen (NORCO) 5-325 MG tablet Take 1 tablet by mouth every 6 (six) hours as needed for moderate pain or severe pain. Do not fill before 03/15/20 90 tablet 0  . HYDROcodone-acetaminophen (NORCO/VICODIN) 5-325 MG tablet Take 1 tablet by mouth every 6 (six) hours as needed for moderate pain. Do not fill before 02/13/20 90 tablet 0  . lisinopril (ZESTRIL) 5 MG tablet Take 1 tablet (5 mg total) by mouth every morning. 90 tablet 3  . montelukast (SINGULAIR) 10 MG tablet take 1 tablet by mouth once daily 90 tablet 3  . Omega 3-6-9 Fatty Acids (OMEGA 3-6-9 COMPLEX PO) Take one tablet by mouth daily    . omeprazole (PRILOSEC) 40 MG capsule take 1 capsule by mouth once daily 90 capsule 3  . OVER THE COUNTER MEDICATION daily as needed. HYLAND'S CELL SALTS #8 MAGNESIA PHOSPHORICA    . rosuvastatin (CRESTOR) 5 MG tablet Take 1 tablet (5 mg total) by mouth daily. 90 tablet 11  . sertraline (ZOLOFT) 50 MG tablet Take 1 tablet (50 mg total) by mouth daily. 90 tablet 3  . tolterodine (DETROL LA) 4 MG 24 hr capsule Take 1 capsule (4 mg total) by mouth daily as needed. 90 capsule 3   No current facility-administered medications on file prior to visit.   Review of Systems  Constitutional:  Negative for chills, fever and malaise/fatigue.  HENT: Negative for congestion, ear pain, sinus pain and sore throat.   Eyes: Negative for blurred vision, discharge and redness.  Respiratory: Negative for cough, shortness of breath and stridor.   Cardiovascular: Negative for chest pain, palpitations and leg swelling.  Gastrointestinal: Negative for abdominal pain, diarrhea, nausea and vomiting.  Musculoskeletal: Positive for joint pain. Negative for myalgias.  Skin: Negative for rash.  Neurological: Negative for dizziness and headaches.  Psychiatric/Behavioral: Positive for depression.       Depression is a little better lately      Observations/Objective: Pt sounds like her usual self  Not distressed Not tearful  Good historian, nl cognition  Good mood today  Assessment and Plan: Problem List Items Addressed This Visit      Other   Morbid obesity (HCC)    Most recent bmi is 57.6  Struggles with intake of sweets/snack foods and emot eating  Not necessarily high appetite Discussed goal of lower glycemic diet  Disc possible role of ozempic or other med of that class for wt loss Disc the NOOM program for wt loss as well as the healthy weight and wellness program with [facility] Noted info on AVS re: options      Chronic pain    Knees-OA and meniscal tears  Shoulders OA and rotator cuff tear on R Sees ortho- rev note Gets steroid injections Would consider knee replacement if she could get bmi down to 40  Taking norco      Encounter for chronic pain management - Primary    Takes norco 5-325 mg up to every 6 hours prn pain  No side effects  Gets 90 per mo  Reviewed NCCSRS and no red flags Due for UDS and pain contract review again on or after 05/29/20 (will arrange for that)  Reviewed recent orthopedic notes and has more shoulder pain (rotator cuff tear and arthritis) along with baseline knee pain  Continues steroid injections  Sent px for  30 day supplies of norco Next pain  encounter due in 3 mo          Follow Up Instructions:    I discussed the assessment and treatment plan with the patient. The patient was provided an opportunity to ask questions and all were answered. The patient agreed with the plan and demonstrated an understanding of the instructions.   The patient was advised to call back or seek an in-person evaluation if the symptoms worsen or if the condition fails to improve as anticipated.  I provided 16 minutes of non-face-to-face time during this encounter.   Roxy Manns, MD

## 2020-04-30 NOTE — Assessment & Plan Note (Signed)
Knees-OA and meniscal tears  Shoulders OA and rotator cuff tear on R Sees ortho- rev note Gets steroid injections Would consider knee replacement if she could get bmi down to 40  Taking norco

## 2020-04-30 NOTE — Assessment & Plan Note (Signed)
Most recent bmi is 84.6  Struggles with intake of sweets/snack foods and emot eating  Not necessarily high appetite Discussed goal of lower glycemic diet  Disc possible role of ozempic or other med of that class for wt loss Disc the NOOM program for wt loss as well as the healthy weight and wellness program with [facility] Noted info on AVS re: options

## 2020-05-13 ENCOUNTER — Telehealth: Payer: PPO | Admitting: Family Medicine

## 2020-06-03 ENCOUNTER — Telehealth: Payer: Self-pay | Admitting: Family Medicine

## 2020-06-03 DIAGNOSIS — G8929 Other chronic pain: Secondary | ICD-10-CM

## 2020-06-03 NOTE — Telephone Encounter (Signed)
-----   Message from Alvina Chou sent at 05/19/2020 11:58 AM EDT ----- Regarding: Lab orders for Wednesday, 5.18.22 Do you want fasting labs as well as the UDS? Please order the UDS. Thanks, T

## 2020-06-04 ENCOUNTER — Other Ambulatory Visit (INDEPENDENT_AMBULATORY_CARE_PROVIDER_SITE_OTHER): Payer: PPO

## 2020-06-04 ENCOUNTER — Encounter: Payer: Self-pay | Admitting: Family Medicine

## 2020-06-04 ENCOUNTER — Other Ambulatory Visit: Payer: Self-pay

## 2020-06-04 DIAGNOSIS — G8929 Other chronic pain: Secondary | ICD-10-CM

## 2020-06-07 LAB — DRUG MONITORING, PANEL 8 WITH CONFIRMATION, URINE
6 Acetylmorphine: NEGATIVE ng/mL (ref <10)
Alcohol Metabolites: NEGATIVE ng/mL
Alphahydroxyalprazolam: 716 ng/mL — ABNORMAL HIGH (ref <25)
Alphahydroxymidazolam: NEGATIVE ng/mL (ref <50)
Alphahydroxytriazolam: NEGATIVE ng/mL (ref <50)
Aminoclonazepam: NEGATIVE ng/mL (ref <25)
Amphetamines: NEGATIVE ng/mL (ref <500)
Benzodiazepines: POSITIVE ng/mL — AB (ref <100)
Buprenorphine, Urine: NEGATIVE ng/mL (ref <5)
Cocaine Metabolite: NEGATIVE ng/mL (ref <150)
Codeine: 107 ng/mL — ABNORMAL HIGH (ref <50)
Creatinine: 163.6 mg/dL
Hydrocodone: 1618 ng/mL — ABNORMAL HIGH (ref <50)
Hydromorphone: 265 ng/mL — ABNORMAL HIGH (ref <50)
Hydroxyethylflurazepam: NEGATIVE ng/mL (ref <50)
Lorazepam: NEGATIVE ng/mL (ref <50)
MDMA: NEGATIVE ng/mL (ref <500)
Marijuana Metabolite: 21 ng/mL — ABNORMAL HIGH (ref <5)
Marijuana Metabolite: POSITIVE ng/mL — AB (ref <20)
Morphine: NEGATIVE ng/mL (ref <50)
Nordiazepam: NEGATIVE ng/mL (ref <50)
Norhydrocodone: 1988 ng/mL — ABNORMAL HIGH (ref <50)
Opiates: POSITIVE ng/mL — AB (ref <100)
Oxazepam: NEGATIVE ng/mL (ref <50)
Oxidant: NEGATIVE ug/mL
Oxycodone: NEGATIVE ng/mL (ref <100)
Temazepam: NEGATIVE ng/mL (ref <50)
pH: 6.8 (ref 4.5–9.0)

## 2020-06-07 LAB — DM TEMPLATE

## 2020-06-11 ENCOUNTER — Other Ambulatory Visit: Payer: Self-pay | Admitting: Family Medicine

## 2020-06-12 NOTE — Telephone Encounter (Signed)
Name of Medication: xanax Name of Pharmacy: [facility] [address] Last Fill or Written Date and Quantity: 08/11/20 #90 tabs 3 refills Last Office Visit and Type: Stop Act (phone call) 04/30/20 Next Office Visit and Type: Stop Act (phone call) 08/06/20 Last Controlled Substance Agreement Date: 06/04/20 Last UDS: 06/04/20

## 2020-07-23 ENCOUNTER — Other Ambulatory Visit: Payer: Self-pay | Admitting: Family Medicine

## 2020-08-06 ENCOUNTER — Telehealth (INDEPENDENT_AMBULATORY_CARE_PROVIDER_SITE_OTHER): Payer: PPO | Admitting: Family Medicine

## 2020-08-06 ENCOUNTER — Encounter: Payer: Self-pay | Admitting: Family Medicine

## 2020-08-06 DIAGNOSIS — M25561 Pain in right knee: Secondary | ICD-10-CM | POA: Diagnosis not present

## 2020-08-06 DIAGNOSIS — R202 Paresthesia of skin: Secondary | ICD-10-CM

## 2020-08-06 DIAGNOSIS — M25562 Pain in left knee: Secondary | ICD-10-CM | POA: Diagnosis not present

## 2020-08-06 DIAGNOSIS — G8929 Other chronic pain: Secondary | ICD-10-CM

## 2020-08-06 DIAGNOSIS — F341 Dysthymic disorder: Secondary | ICD-10-CM | POA: Diagnosis not present

## 2020-08-06 MED ORDER — HYDROCODONE-ACETAMINOPHEN 5-325 MG PO TABS: 1.0000 | ORAL_TABLET | Freq: Four times a day (QID) | ORAL | 0 refills | Status: DC | PRN

## 2020-08-06 NOTE — Assessment & Plan Note (Signed)
Disc stressors/husband had a stroke Reviewed stressors/ coping techniques/symptoms/ support sources/ tx options and side effects in detail today Takes zoloft 50 mg daily  #90 xanax 1 mg monthly (long term)

## 2020-08-06 NOTE — Patient Instructions (Signed)
I sent your px to the pharmacy  Keep [place] posted Get wrist splints for likely carpal tunnel and address this at your next orthopedic appt

## 2020-08-06 NOTE — Assessment & Plan Note (Signed)
From OA  L worse than R  Next ortho appt aug 3 will get injection  Wt loss would help  Refilled norco today with enc for chronic pain management

## 2020-08-06 NOTE — Progress Notes (Signed)
Virtual Visit via Telephone Note  I connected with Monica Madden on 08/06/20 at 10:00 AM EDT by telephone and verified that I am speaking with the correct person using two identifiers.  Location: Patient: home Provider: office    I discussed the limitations, risks, security and privacy concerns of performing an evaluation and management service by telephone and the availability of in person appointments. I also discussed with the patient that there may be a patient responsible charge related to this service. The patient expressed understanding and agreed to proceed.  Parties involved in encounter  Patient: Monica Madden  Provider:  Roxy Manns MD   History of Present Illness: Pt presents for encounter for chronic pain management   Doing ok overall  Usual schedule /nothing different Her husband had a stroke in May (affected R eye and he cannot drive now)  He also has CHF and vascular problems  Her daughter drives for them now - glad to help  Stressful   Still takes zoloft   Indication for chronic opioid: joint pain/knees and shoulders from osteoarthritis Last orthopedic visit was in February L knee is worse -feels like bone on bone  Shoulders-both hurt Neck- mild pain  Some lower back pain   Recently more trouble with hands -thumb index finger feel numb at times  Worse when using them  Occ pain as well  For example holding a fork or cell phone  Thinks she has carpal tunnel No reped work that she knows of -no computer   Has ortho visit aug 3 -upcoming  Will have knee injection that day L  Also R shoulder  Last visit xr arthritis in both knees and both shoulders    Medication and dose: norco 5-3.25 mg up to every 6 hours prn Also takes prn alprazolam 1 mg up to tid prn for anxiety   # pills per month: 90 Last refilled 07/14/20 for both norco and alprazolam   Last UDS date: 06/04/20 Consistent with current medications (low risk)  Aware she takes marijuana product  as well    Opioid Treatment Agreement signed (Y/N): yes  Opioid Treatment Agreement last reviewed with patient:  06/04/20  NCCSRS reviewed this encounter (include red flags):  today, no red flags      Patient Active Problem List   Diagnosis Date Noted   Paresthesia of both hands 08/06/2020   Medicare annual wellness visit, subsequent 12/01/2018   Encounter for chronic pain management 02/07/2017   Chronic pain 11/17/2015   Elevated glucose level 11/09/2015   Routine general medical examination at a health care facility 06/30/2011   Amenorrhea 06/30/2011   Adverse effects of medication 07/01/2010   DYSPEPSIA 09/26/2009   IRRITABLE BOWEL SYNDROME 08/07/2009   KNEE PAIN, BILATERAL 08/07/2009   CONSTIPATION 01/15/2009   LEG PAIN, LEFT 10/09/2008   Hyperlipidemia 05/09/2008   Essential hypertension 03/29/2008   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 06/05/2007   INCI HERNIA WITHOUT MENTION OBSTRUCTION/GANGRENE 05/12/2007   FREQUENCY, URINARY 05/12/2007   ARTHRALGIA 11/03/2006   DEPENDENT EDEMA, LEGS 11/03/2006   Morbid obesity (HCC) 07/15/2006   ANXIETY DEPRESSION 07/15/2006   MIGRAINE HEADACHE 07/15/2006   RETINITIS PIGMENTOSA 07/15/2006   VARICOSE VEINS, LOWER EXTREMITIES 07/15/2006   ALLERGIC RHINITIS 07/15/2006   ASTHMA 07/15/2006   GERD 07/15/2006   BACK PAIN, LUMBAR, CHRONIC 07/15/2006   INCONTINENCE, URGE 07/15/2006   Past Medical History:  Diagnosis Date   Allergy    allergic rhinitis   Anemia    Anxiety  ATTACKS   Arthritis    Asthma    Cataract    left cataract removal 12/2008   Chronic back pain    OA of spine   Depression    GERD (gastroesophageal reflux disease)    Endo negative 02/2000   Hypertension 03/2008   IBS (irritable bowel syndrome)    MRSA infection 2008   Right leg   Obesity    Ovarian cyst    Retinitis pigmentosa    blind   Past Surgical History:  Procedure Laterality Date   APPENDECTOMY     BLADDER SURGERY  1968    age 68 ? dilitation   CATARACT EXTRACTION W/PHACO Right 11/11/2014   Procedure: CATARACT EXTRACTION PHACO AND INTRAOCULAR LENS PLACEMENT (IOC);  Surgeon: Sallee Lange, MD;  Location: [facility];  Service: Ophthalmology;  Laterality: Right;  LOT PACK: [id number] H [place]: 00:29.7 AP: 11.2 CDE: 7.14   CESAREAN SECTION     CHOLECYSTECTOMY     CYSTOSCOPY  03/2008   negative// Dr. Wanda Plump urologist   ENDOMETRIAL BIOPSY  07/1998   negative   EYE SURGERY  12/2008   cataract removal left   HERNIA REPAIR  05/2006   ruptured hernia repair after fall and then two more surgeries within as many weeks for complications(05/2006) Umbilical hernia repair (06/1997)   KNEE ARTHROSCOPY W/ MENISCAL REPAIR     left knne   Social History   Tobacco Use   Smoking status: Never   Smokeless tobacco: Never  Substance Use Topics   Alcohol use: Yes    Comment: twice a year   Drug use: Yes    Types: Marijuana   Family History  Problem Relation Age of Onset   Depression Mother    Heart disease Father    Diabetes Maternal Uncle    Hypertension Maternal Grandmother    Depression Brother        Depression secondary to MVA injuries   Hypertension Brother    Heart disease Paternal Grandfather    Heart disease Paternal Grandmother    Allergies  Allergen Reactions   Diclofenac Sodium     REACTION: allergic/ made asthma bad   Etodolac     REACTION: GI   Metronidazole     REACTION: GI side eff   Nsaids Other (See Comments)    GI Upset   Oxybutynin Itching   Sulfamethoxazole-Trimethoprim    Tolterodine Tartrate     REACTION: None Effective   Tramadol Other (See Comments)    Caused elevated BP   Sulfa Antibiotics Itching and Rash   Current Outpatient Medications on File Prior to Visit  Medication Sig Dispense Refill   Albuterol Sulfate (PROAIR HFA IN) Inhale 2 puffs into the lungs every 6 (six) hours as needed.     ALLERGY RELIEF 180 MG tablet TAKE 1 TABLET BY MOUTH EVERY DAY 90 tablet 1    ALPRAZolam (XANAX) 1 MG tablet TAKE 1 TABLET(1 MG) BY MOUTH THREE TIMES DAILY AS NEEDED 90 tablet 3   Ascorbic Acid (VITAMIN C) 1000 MG tablet Take 1,000 mg by mouth daily.     Black Elderberry,Berry-Flower, 575 MG CAPS Take 1 capsule by mouth daily.     Calcium Carbonate-Vitamin D 600-400 MG-UNIT tablet Take 1 tablet by mouth daily.     Cholecalciferol (VITAMIN D3 PO) Take 1,000 Units by mouth daily.     Coenzyme Q10 (CO Q 10 PO) Take 1 tablet by mouth daily.     cyclobenzaprine (FLEXERIL) 10 MG tablet Take 10 mg by  mouth 3 (three) times daily as needed for muscle spasms.     fluticasone (FLONASE) 50 MCG/ACT nasal spray SHAKE LIQUID AND USE 2 SPRAYS IN EACH NOSTRIL TWICE DAILY 48 g 1   lisinopril (ZESTRIL) 5 MG tablet Take 1 tablet (5 mg total) by mouth every morning. 90 tablet 3   montelukast (SINGULAIR) 10 MG tablet take 1 tablet by mouth once daily 90 tablet 3   Omega 3-6-9 Fatty Acids (OMEGA 3-6-9 COMPLEX PO) Take one tablet by mouth daily     omeprazole (PRILOSEC) 40 MG capsule take 1 capsule by mouth once daily 90 capsule 3   OVER THE COUNTER MEDICATION daily as needed. HYLAND'S CELL SALTS #8 MAGNESIA PHOSPHORICA     rosuvastatin (CRESTOR) 5 MG tablet Take 1 tablet (5 mg total) by mouth daily. 90 tablet 11   sertraline (ZOLOFT) 50 MG tablet Take 1 tablet (50 mg total) by mouth daily. 90 tablet 3   tolterodine (DETROL LA) 4 MG 24 hr capsule Take 1 capsule (4 mg total) by mouth daily as needed. 90 capsule 3   No current facility-administered medications on file prior to visit.   Review of Systems  Constitutional:  Negative for chills, fever and malaise/fatigue.  HENT:  Negative for congestion, ear pain, sinus pain and sore throat.   Eyes:  Negative for blurred vision, discharge and redness.  Respiratory:  Negative for cough, shortness of breath and stridor.   Cardiovascular:  Negative for chest pain, palpitations and leg swelling.  Gastrointestinal:  Negative for abdominal pain,  diarrhea, nausea and vomiting.  Musculoskeletal:  Positive for back pain, joint pain and neck pain. Negative for myalgias.  Skin:  Negative for rash.  Neurological:  Positive for tingling. Negative for dizziness, weakness and headaches.   Observations/Objective: Pt sounds well like her normal self  Discusses mood and stressors candidly  Not hoarse No audible cough or sob  Good historian/nl cognition  Nl mood   Assessment and Plan: Problem List Items Addressed This Visit       Other   ANXIETY DEPRESSION    Disc stressors/husband had a stroke Reviewed stressors/ coping techniques/symptoms/ support sources/ tx options and side effects in detail today Takes zoloft 50 mg daily  #90 xanax 1 mg monthly (long term)         KNEE PAIN, BILATERAL    From OA  L worse than R  Next ortho appt aug 3 will get injection  Wt loss would help  Refilled norco today with enc for chronic pain management        Encounter for chronic pain management - Primary    Pain from OA of knees and shoulders Next ortho appt early august Takes norco 5-325 mg up to every 6 h 90 per month  Next refill due 7/26 Three px sent with directions re: when to fill  Continues to get some pain relief  Low risk UDS and contact rev 06/04/20 No red flags on NCCSRS       Paresthesia of both hands    1, 2nd fingers  When active  Mild pain  Suspect carpal tunnel given description  Urged to get some otc wrist splints and wear them at night  Will f/u with ortho on aug 3 and eval further  May need ncv tests         Follow Up Instructions: I sent your px to the pharmacy  Keep us posted Get wrist splints for likely carpal tunnel and address this at  your next orthopedic appt   I discussed the assessment and treatment plan with the patient. The patient was provided an opportunity to ask questions and all were answered. The patient agreed with the plan and demonstrated an understanding of the instructions.   The  patient was advised to call back or seek an in-person evaluation if the symptoms worsen or if the condition fails to improve as anticipated.  I provided 18 minutes of non-face-to-face time during this encounter.   Roxy Manns, MD

## 2020-08-06 NOTE — Assessment & Plan Note (Signed)
Pain from OA of knees and shoulders Next ortho appt early august Takes norco 5-325 mg up to every 6 h 90 per month  Next refill due 7/26 Three px sent with directions re: when to fill  Continues to get some pain relief  Low risk UDS and contact rev 06/04/20 No red flags on NCCSRS

## 2020-08-06 NOTE — Assessment & Plan Note (Signed)
1, 2nd fingers  When active  Mild pain  Suspect carpal tunnel given description  Urged to get some otc wrist splints and wear them at night  Will f/u with ortho on aug 3 and eval further  May need ncv tests

## 2020-08-20 DIAGNOSIS — M7581 Other shoulder lesions, right shoulder: Secondary | ICD-10-CM | POA: Diagnosis not present

## 2020-08-20 DIAGNOSIS — M1712 Unilateral primary osteoarthritis, left knee: Secondary | ICD-10-CM | POA: Diagnosis not present

## 2020-10-12 ENCOUNTER — Other Ambulatory Visit: Payer: Self-pay | Admitting: Family Medicine

## 2020-10-12 ENCOUNTER — Encounter: Payer: Self-pay | Admitting: Family Medicine

## 2020-10-14 ENCOUNTER — Encounter: Payer: Self-pay | Admitting: Family Medicine

## 2020-10-15 NOTE — Telephone Encounter (Signed)
Name of Medication: xanax Name of Pharmacy: [facility] [address] Last Fill or Written Date and Quantity: 06/12/20 #90 tabs 3 refills Last Office Visit and Type: Stop Act (phone call) 08/06/20 Next Office Visit and Type: Stop Act (phone call) 11/06/20 Last Controlled Substance Agreement Date: 06/04/20 Last UDS: 06/04/20

## 2020-10-22 ENCOUNTER — Other Ambulatory Visit: Payer: Self-pay | Admitting: Family Medicine

## 2020-11-03 ENCOUNTER — Other Ambulatory Visit: Payer: Self-pay | Admitting: Family Medicine

## 2020-11-03 DIAGNOSIS — Z1231 Encounter for screening mammogram for malignant neoplasm of breast: Secondary | ICD-10-CM

## 2020-11-06 ENCOUNTER — Encounter: Payer: Self-pay | Admitting: Family Medicine

## 2020-11-06 ENCOUNTER — Ambulatory Visit (INDEPENDENT_AMBULATORY_CARE_PROVIDER_SITE_OTHER): Payer: PPO | Admitting: Family Medicine

## 2020-11-06 VITALS — Ht 60.5 in | Wt 315.0 lb

## 2020-11-06 DIAGNOSIS — G8929 Other chronic pain: Secondary | ICD-10-CM | POA: Diagnosis not present

## 2020-11-06 MED ORDER — HYDROCODONE-ACETAMINOPHEN 5-325 MG PO TABS: 1.0000 | ORAL_TABLET | Freq: Four times a day (QID) | ORAL | 0 refills | Status: DC | PRN

## 2020-11-06 NOTE — Patient Instructions (Signed)
I sent in your prescriptions   Let me know if you have any problems  No change in treatment

## 2020-11-06 NOTE — Progress Notes (Signed)
Virtual Visit via Telephone Note  I connected with Monica Madden on 11/06/20 at 10:30 AM EDT by telephone and verified that I am speaking with the correct person using two identifiers.  Location: Patient: home Provider: office   I discussed the limitations, risks, security and privacy concerns of performing an evaluation and management service by telephone and the availability of in person appointments. I also discussed with the patient that there may be a patient responsible charge related to this service. The patient expressed understanding and agreed to proceed.  Parties involved in encounter  Patient: Monica Madden  Provider:  Roxy Manns MD   History of Present Illness: Pt presents for enc for chronic pain management  Indication for chronic opioid: joint pain from OA, knees and shoulders  Medication and dose: norco 5-3.25 mg up to every 6 hours prn Also takes alprazolam 1 mg tid for anxiety  # pills per month: 90 norco , 90 alprazolam Last refill of norco was 10/14/20 Alprazolam 10/15/20  Ortho f/u with PA Floyce Stakes was 08/20/20 Had L knee injection  R shoulder injection   Shoulder and knees hurt the most these days  Some numbness on 3 fingers of R hand  She ordered a splint  Fingers stopped being numb after injection  It helped a lot   Plans to set up appt in November   Has blood work and PE here in November and mammo and oph  Having trouble with transportation    Last UDS date: 07/05/20 low risk  Opioid Treatment Agreement signed (Y/N): yes   Opioid Treatment Agreement last reviewed with patient:   06/04/20   NCCSRS reviewed this encounter (include red flags):  today, no red flags  Wt Readings from Last 3 Encounters:  11/06/20 (!) 315 lb (142.9 kg)  12/03/19 (!) 303 lb 8 oz (137.7 kg)  12/01/18 (!) 320 lb 3 oz (145.2 kg)   60.51 kg/m  Overall about the same  Had flu shot and covid booster     Patient Active Problem List   Diagnosis Date Noted    Paresthesia of both hands 08/06/2020   Medicare annual wellness visit, subsequent 12/01/2018   Encounter for chronic pain management 02/07/2017   Chronic pain 11/17/2015   Elevated glucose level 11/09/2015   Routine general medical examination at a health care facility 06/30/2011   Amenorrhea 06/30/2011   Adverse effects of medication 07/01/2010   DYSPEPSIA 09/26/2009   IRRITABLE BOWEL SYNDROME 08/07/2009   KNEE PAIN, BILATERAL 08/07/2009   CONSTIPATION 01/15/2009   LEG PAIN, LEFT 10/09/2008   Hyperlipidemia 05/09/2008   Essential hypertension 03/29/2008   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 06/05/2007   INCI HERNIA WITHOUT MENTION OBSTRUCTION/GANGRENE 05/12/2007   FREQUENCY, URINARY 05/12/2007   ARTHRALGIA 11/03/2006   DEPENDENT EDEMA, LEGS 11/03/2006   Morbid obesity (HCC) 07/15/2006   ANXIETY DEPRESSION 07/15/2006   MIGRAINE HEADACHE 07/15/2006   RETINITIS PIGMENTOSA 07/15/2006   VARICOSE VEINS, LOWER EXTREMITIES 07/15/2006   ALLERGIC RHINITIS 07/15/2006   ASTHMA 07/15/2006   GERD 07/15/2006   BACK PAIN, LUMBAR, CHRONIC 07/15/2006   INCONTINENCE, URGE 07/15/2006   Past Medical History:  Diagnosis Date   Allergy    allergic rhinitis   Anemia    Anxiety    ATTACKS   Arthritis    Asthma    Cataract    left cataract removal 12/2008   Chronic back pain    OA of spine   Depression    GERD (gastroesophageal reflux disease)  Endo negative 02/2000   Hypertension 03/2008   IBS (irritable bowel syndrome)    MRSA infection 2008   Right leg   Obesity    Ovarian cyst    Retinitis pigmentosa    blind   Past Surgical History:  Procedure Laterality Date   APPENDECTOMY     BLADDER SURGERY  1968   age 15 ? dilitation   CATARACT EXTRACTION W/PHACO Right 11/11/2014   Procedure: CATARACT EXTRACTION PHACO AND INTRAOCULAR LENS PLACEMENT (IOC);  Surgeon: Sallee Lange, MD;  Location: [facility];  Service: Ophthalmology;  Laterality: Right;  LOT PACK:  [id number] H [place]: 00:29.7 AP: 11.2 CDE: 7.14   CESAREAN SECTION     CHOLECYSTECTOMY     CYSTOSCOPY  03/2008   negative// Dr. Wanda Plump urologist   ENDOMETRIAL BIOPSY  07/1998   negative   EYE SURGERY  12/2008   cataract removal left   HERNIA REPAIR  05/2006   ruptured hernia repair after fall and then two more surgeries within as many weeks for complications(05/2006) Umbilical hernia repair (06/1997)   KNEE ARTHROSCOPY W/ MENISCAL REPAIR     left knne   Social History   Tobacco Use   Smoking status: Never   Smokeless tobacco: Never  Substance Use Topics   Alcohol use: Yes    Comment: twice a year   Drug use: Yes    Types: Marijuana   Family History  Problem Relation Age of Onset   Depression Mother    Heart disease Father    Diabetes Maternal Uncle    Hypertension Maternal Grandmother    Depression Brother        Depression secondary to MVA injuries   Hypertension Brother    Heart disease Paternal Grandfather    Heart disease Paternal Grandmother    Allergies  Allergen Reactions   Diclofenac Sodium     REACTION: allergic/ made asthma bad   Etodolac     REACTION: GI   Metronidazole     REACTION: GI side eff   Nsaids Other (See Comments)    GI Upset   Oxybutynin Itching   Sulfamethoxazole-Trimethoprim    Tolterodine Tartrate     REACTION: None Effective   Tramadol Other (See Comments)    Caused elevated BP   Sulfa Antibiotics Itching and Rash   Current Outpatient Medications on File Prior to Visit  Medication Sig Dispense Refill   Albuterol Sulfate (PROAIR HFA IN) Inhale 2 puffs into the lungs every 6 (six) hours as needed.     ALLERGY RELIEF 180 MG tablet TAKE 1 TABLET BY MOUTH EVERY DAY 90 tablet 1   ALPRAZolam (XANAX) 1 MG tablet TAKE 1 TABLET(1 MG) BY MOUTH THREE TIMES DAILY AS NEEDED 90 tablet 3   Ascorbic Acid (VITAMIN C) 1000 MG tablet Take 1,000 mg by mouth daily.     Black Elderberry,Berry-Flower, 575 MG CAPS Take 1 capsule by mouth daily.      Calcium Carbonate-Vitamin D 600-400 MG-UNIT tablet Take 1 tablet by mouth daily.     Cholecalciferol (VITAMIN D3 PO) Take 1,000 Units by mouth daily.     Coenzyme Q10 (CO Q 10 PO) Take 1 tablet by mouth daily.     cyclobenzaprine (FLEXERIL) 10 MG tablet Take 10 mg by mouth 3 (three) times daily as needed for muscle spasms.     fluticasone (FLONASE) 50 MCG/ACT nasal spray SHAKE LIQUID AND USE 2 SPRAYS IN EACH NOSTRIL TWICE DAILY 48 g 1   lisinopril (ZESTRIL) 5 MG tablet Take 1  tablet (5 mg total) by mouth every morning. 90 tablet 3   montelukast (SINGULAIR) 10 MG tablet take 1 tablet by mouth once daily 90 tablet 3   Omega 3-6-9 Fatty Acids (OMEGA 3-6-9 COMPLEX PO) Take one tablet by mouth daily     omeprazole (PRILOSEC) 40 MG capsule take 1 capsule by mouth once daily 90 capsule 3   OVER THE COUNTER MEDICATION daily as needed. HYLAND'S CELL SALTS #8 MAGNESIA PHOSPHORICA     rosuvastatin (CRESTOR) 5 MG tablet Take 1 tablet (5 mg total) by mouth daily. 90 tablet 11   sertraline (ZOLOFT) 50 MG tablet Take 1 tablet (50 mg total) by mouth daily. 90 tablet 3   tolterodine (DETROL LA) 4 MG 24 hr capsule Take 1 capsule (4 mg total) by mouth daily as needed. 90 capsule 3   No current facility-administered medications on file prior to visit.    Observations/Objective: Pt sounds well  In no distress Not hoarse  No cough or sob  Nl cognition/good historian  Nl mood, cheerful   Assessment and Plan: Problem List Items Addressed This Visit       Other   Chronic pain    Osteoarthritis of knees and shoulders  Recent inj with orthopedics  Enc wt loss  Refilled norco      Relevant Medications   HYDROcodone-acetaminophen (NORCO) 5-325 MG tablet   HYDROcodone-acetaminophen (NORCO) 5-325 MG tablet   HYDROcodone-acetaminophen (NORCO/VICODIN) 5-325 MG tablet   Encounter for chronic pain management - Primary    For OA pain , shoulders/knees  norco 5-325 mg up to every 6 hours, #90 per month No  changes in condition or med tolerance  Still helpful and continues orthopedic treatment  [place] reviewed -no red flags Contract and UDS is utd from 06/04/20-reviewed  Will f/u in 3 months  3 post dated px sent to pharmacy to fill after 10/26, 11/26 and 01/12/21        Follow Up Instructions: I sent in your prescriptions   Let me know if you have any problems  No change in treatment    I discussed the assessment and treatment plan with the patient. The patient was provided an opportunity to ask questions and all were answered. The patient agreed with the plan and demonstrated an understanding of the instructions.   The patient was advised to call back or seek an in-person evaluation if the symptoms worsen or if the condition fails to improve as anticipated.  I provided 17 minutes of non-face-to-face time during this encounter.   Roxy Manns, MD

## 2020-11-06 NOTE — Assessment & Plan Note (Signed)
For OA pain , shoulders/knees  norco 5-325 mg up to every 6 hours, #90 per month No changes in condition or med tolerance  Still helpful and continues orthopedic treatment  [place]CRS reviewed -no red flags Contract and UDS is utd from 06/04/20-reviewed  Will f/u in 3 months  3 post dated px sent to pharmacy to fill after 10/26, 11/26 and 01/12/21

## 2020-11-06 NOTE — Assessment & Plan Note (Signed)
Osteoarthritis of knees and shoulders  Recent inj with orthopedics  Enc wt loss  Refilled norco

## 2020-11-24 DIAGNOSIS — M1712 Unilateral primary osteoarthritis, left knee: Secondary | ICD-10-CM | POA: Diagnosis not present

## 2020-11-24 DIAGNOSIS — M7581 Other shoulder lesions, right shoulder: Secondary | ICD-10-CM | POA: Diagnosis not present

## 2020-11-27 ENCOUNTER — Other Ambulatory Visit: Payer: PPO

## 2020-12-01 NOTE — Progress Notes (Signed)
Subjective:   Monica Madden is a 60 y.o. female who presents for Medicare Annual (Subsequent) preventive examination.  I connected with Monica Madden today by telephone and verified that I am speaking with the correct person using two identifiers. Location patient: home Location provider: work Persons participating in the virtual visit: patient, Engineer, civil (consulting).    I discussed the limitations, risks, security and privacy concerns of performing an evaluation and management service by telephone and the availability of in person appointments. I also discussed with the patient that there may be a patient responsible charge related to this service. The patient expressed understanding and verbally consented to this telephonic visit.    Interactive audio and video telecommunications were attempted between this provider and patient, however failed, due to patient having technical difficulties OR patient did not have access to video capability.  We continued and completed visit with audio only.  Some vital signs may be absent or patient reported.   Time Spent with patient on telephone encounter: 30 minutes  Review of Systems     Cardiac Risk Factors include: advanced age (>46men, >71 women);dyslipidemia;obesity (BMI >30kg/m2)     Objective:    Today's Vitals   12/03/20 1116 12/03/20 1117  Weight: (!) 315 lb (142.9 kg)   Height: 5' (1.524 m)   PainSc:  4    Body mass index is 61.52 kg/m.  Advanced Directives 12/03/2020 11/22/2017 03/25/2016 11/11/2014  Does Patient Have a Medical Advance Directive? Yes Yes Yes Yes  Type of Estate agent of [place];Living will Living will Healthcare Power of [place];Living will Living will  Does patient want to make changes to medical advance directive? Yes (MAU/Ambulatory/Procedural Areas - Information given) - - No - Patient declined  Copy of Healthcare Power of Attorney in Chart? - - No - copy requested No - copy requested    Current  Medications (verified) Outpatient Encounter Medications as of 12/03/2020  Medication Sig   Albuterol Sulfate (PROAIR HFA IN) Inhale 2 puffs into the lungs every 6 (six) hours as needed.   ALLERGY RELIEF 180 MG tablet TAKE 1 TABLET BY MOUTH EVERY DAY   ALPRAZolam (XANAX) 1 MG tablet TAKE 1 TABLET(1 MG) BY MOUTH THREE TIMES DAILY AS NEEDED   Ascorbic Acid (VITAMIN C) 1000 MG tablet Take 1,000 mg by mouth daily.   Black Elderberry,Berry-Flower, 575 MG CAPS Take 1 capsule by mouth daily.   Calcium Carbonate-Vitamin D 600-400 MG-UNIT tablet Take 1 tablet by mouth daily.   Cholecalciferol (VITAMIN D3 PO) Take 1,000 Units by mouth daily.   Coenzyme Q10 (CO Q 10 PO) Take 1 tablet by mouth daily.   cyclobenzaprine (FLEXERIL) 10 MG tablet Take 10 mg by mouth 3 (three) times daily as needed for muscle spasms.   fluticasone (FLONASE) 50 MCG/ACT nasal spray SHAKE LIQUID AND USE 2 SPRAYS IN EACH NOSTRIL TWICE DAILY   HYDROcodone-acetaminophen (NORCO) 5-325 MG tablet Take 1 tablet by mouth every 6 (six) hours as needed for moderate pain or severe pain. Do not fill before  11/12/20   HYDROcodone-acetaminophen (NORCO) 5-325 MG tablet Take 1 tablet by mouth every 6 (six) hours as needed for moderate pain or severe pain. Do not fill before 12/13/20   HYDROcodone-acetaminophen (NORCO/VICODIN) 5-325 MG tablet Take 1 tablet by mouth every 6 (six) hours as needed for moderate pain. Do not fill before 01/12/21   lisinopril (ZESTRIL) 5 MG tablet Take 1 tablet (5 mg total) by mouth every morning.   montelukast (SINGULAIR) 10 MG  tablet take 1 tablet by mouth once daily   Omega 3-6-9 Fatty Acids (OMEGA 3-6-9 COMPLEX PO) Take one tablet by mouth daily   omeprazole (PRILOSEC) 40 MG capsule take 1 capsule by mouth once daily   OVER THE COUNTER MEDICATION daily as needed. HYLAND'S CELL SALTS #8 MAGNESIA PHOSPHORICA   rosuvastatin (CRESTOR) 5 MG tablet Take 1 tablet (5 mg total) by mouth daily.   sertraline (ZOLOFT) 50 MG  tablet Take 1 tablet (50 mg total) by mouth daily.   tolterodine (DETROL LA) 4 MG 24 hr capsule Take 1 capsule (4 mg total) by mouth daily as needed.   No facility-administered encounter medications on file as of 12/03/2020.    Allergies (verified) Diclofenac sodium, Etodolac, Metronidazole, Nsaids, Oxybutynin, Sulfamethoxazole-trimethoprim, Tolterodine tartrate, Tramadol, and Sulfa antibiotics   History: Past Medical History:  Diagnosis Date   Allergy    allergic rhinitis   Anemia    Anxiety    ATTACKS   Arthritis    Asthma    Cataract    left cataract removal 12/2008   Chronic back pain    OA of spine   Depression    GERD (gastroesophageal reflux disease)    Endo negative 02/2000   Hypertension 03/2008   IBS (irritable bowel syndrome)    MRSA infection 2008   Right leg   Obesity    Ovarian cyst    Retinitis pigmentosa    blind   Past Surgical History:  Procedure Laterality Date   APPENDECTOMY     BLADDER SURGERY  1968   age 25 ? dilitation   CATARACT EXTRACTION W/PHACO Right 11/11/2014   Procedure: CATARACT EXTRACTION PHACO AND INTRAOCULAR LENS PLACEMENT (IOC);  Surgeon: Sallee Lange, MD;  Location: [facility] ORS;  Service: Ophthalmology;  Laterality: Right;  LOT PACK: [id number] H [place]: 00:29.7 AP: 11.2 CDE: 7.14   CESAREAN SECTION     CHOLECYSTECTOMY     CYSTOSCOPY  03/2008   negative// Dr. Wanda Plump urologist   ENDOMETRIAL BIOPSY  07/1998   negative   EYE SURGERY  12/2008   cataract removal left   HERNIA REPAIR  05/2006   ruptured hernia repair after fall and then two more surgeries within as many weeks for complications(05/2006) Umbilical hernia repair (06/1997)   KNEE ARTHROSCOPY W/ MENISCAL REPAIR     left knne   Family History  Problem Relation Age of Onset   Depression Mother    Heart disease Father    Diabetes Maternal Uncle    Hypertension Maternal Grandmother    Depression Brother        Depression secondary to MVA injuries   Hypertension  Brother    Heart disease Paternal Grandfather    Heart disease Paternal Grandmother    Social History   Socioeconomic History   Marital status: Married    Spouse name: Not on file   Number of children: Not on file   Years of education: Not on file   Highest education level: Not on file  Occupational History   Not on file  Tobacco Use   Smoking status: Never   Smokeless tobacco: Never  Substance and Sexual Activity   Alcohol use: Yes    Comment: twice a year   Drug use: Yes    Types: Marijuana   Sexual activity: Never  Other Topics Concern   Not on file  Social History Narrative   Vision impaired    Social Determinants of Health   Financial Resource Strain: Low Risk    Difficulty  of Paying Living Expenses: Not hard at all  Food Insecurity: No Food Insecurity   Worried About Running Out of Food in the Last Year: Never true   Ran Out of Food in the Last Year: Never true  Transportation Needs: No Transportation Needs   Lack of Transportation (Medical): No   Lack of Transportation (Non-Medical): No  Physical Activity: Insufficiently Active   Days of Exercise per Week: 7 days   Minutes of Exercise per Session: 20 min  Stress: No Stress Concern Present   Feeling of Stress : Not at all  Social Connections: Moderately Isolated   Frequency of Communication with Friends and Family: More than three times a week   Frequency of Social Gatherings with Friends and Family: More than three times a week   Attends Religious Services: Never   Database administrator or Organizations: No   Attends Engineer, structural: Never   Marital Status: Married    Tobacco Counseling Counseling given: Not Answered   Clinical Intake:  Pre-visit preparation completed: Yes  Pain : 0-10 Pain Score: 4  Pain Location: Knee Pain Orientation: Left     BMI - recorded: 61.52 Nutritional Status: BMI > 30  Obese Nutritional Risks: None Diabetes: No  How often do you need to have  someone help you when you read instructions, pamphlets, or other written materials from your doctor or pharmacy?: 1 - Never  Diabetic?No  Interpreter Needed?: No  Information entered by :: Sharen Heck LPN   Activities of Daily Living In your present state of health, do you have any difficulty performing the following activities: 12/03/2020  Hearing? N  Vision? N  Difficulty concentrating or making decisions? N  Walking or climbing stairs? Y  Comment uses cane  Dressing or bathing? N  Doing errands, shopping? Y  Comment patient states being legally blind  Preparing Food and eating ? N  Using the Toilet? N  In the past six months, have you accidently leaked urine? Y  Comment when coughing  Do you have problems with loss of bowel control? N  Managing your Medications? N  Managing your Finances? N  Housekeeping or managing your Housekeeping? N  Some recent data might be hidden    Patient Care Team: Tower, Audrie Gallus, MD as PCP - General  Indicate any recent Medical Services you may have received from other than [facility] providers in the past year (date may be approximate).     Assessment:   This is a routine wellness examination for [place].  Hearing/Vision screen Hearing Screening - Comments:: No issues Vision Screening - Comments:: Last exam 2021, plans on making an appointment, Dr. Corey Skains  Dietary issues and exercise activities discussed: Current Exercise Habits: Home exercise routine, Type of exercise: stretching;walking, Time (Minutes): 15, Frequency (Times/Week): 7, Weekly Exercise (Minutes/Week): 105, Intensity: Mild   Goals Addressed             This Visit's Progress    Patient Stated       Would like to lose weight.       Depression Screen PHQ 2/9 Scores 12/03/2020 12/03/2019 12/01/2018 12/01/2018 05/05/2018 11/22/2017 03/25/2016  PHQ - 2 Score 0 0 0 0 0 2 0  PHQ- 9 Score - - 1 - 4 5 -    Fall Risk Fall Risk  12/03/2020 12/03/2019 12/01/2018 11/22/2017  03/25/2016  Falls in the past year? 0 0 0 0 No  Number falls in past yr: 0 - - - -  Injury  with Fall? 0 - - - -  Risk for fall due to : Impaired mobility - - - -  Follow up Falls prevention discussed Falls evaluation completed Falls evaluation completed - -    FALL RISK PREVENTION PERTAINING TO THE HOME:  Any stairs in or around the home? Yes  If so, are there any without handrails? Yes  Home free of loose throw rugs in walkways, pet beds, electrical cords, etc? Yes  Adequate lighting in your home to reduce risk of falls? Yes   ASSISTIVE DEVICES UTILIZED TO PREVENT FALLS:  Life alert? No  Use of a cane, walker or w/c? Yes , cane Grab bars in the bathroom? No  Shower chair or bench in shower? Yes  Elevated toilet seat or a handicapped toilet? No   TIMED UP AND GO:  Was the test performed? No , visit completed over the phone.   Cognitive Function: Normal cognitive status assessed by this Nurse Health Advisor. No abnormalities found.   MMSE - Mini Mental State Exam 11/22/2017 03/25/2016  Orientation to time 5 5  Orientation to Place 5 5  Registration 3 3  Attention/ Calculation 0 0  Recall 3 3  Language- name 2 objects 0 0  Language- repeat 1 1  Language- follow 3 step command 3 0  Language- follow 3 step command-comments - this was not assessed due to pt is legally blind  Language- read & follow direction 0 0  Write a sentence 0 0  Copy design 0 0  Total score 20 17        Immunizations Immunization History  Administered Date(s) Administered   Influenza Inj Mdck Quad Pf 10/10/2020   Influenza Split 01/01/2011   Influenza,inj,Quad PF,6+ Mos 10/30/2014, 10/19/2017, 12/01/2018, 12/03/2019   Influenza-Unspecified 10/27/2012, 10/21/2015, 10/19/2016   Moderna Covid-19 Vaccine Bivalent Booster 13yrs & up 10/10/2020   Moderna Sars-Covid-2 Vaccination 04/11/2019, 05/15/2019, 11/27/2019   Pneumococcal Polysaccharide-23 06/30/2011, 12/01/2018   Td 07/28/2004   Tdap  10/30/2014    TDAP status: Up to date  Flu Vaccine status: Up to date  Pneumococcal vaccine status: Up to date  Covid-19 vaccine status: Information provided on how to obtain vaccines.   Qualifies for Shingles Vaccine? Yes   Zostavax completed No   Shingrix Completed?: No.    Education has been provided regarding the importance of this vaccine. Patient has been advised to call insurance company to determine out of pocket expense if they have not yet received this vaccine. Advised may also receive vaccine at local pharmacy or Health Dept. Verbalized acceptance and understanding.  Screening Tests Health Maintenance  Topic Date Due   Zoster Vaccines- Shingrix (1 of 2) Never done   PAP SMEAR-Modifier  08/26/2017   COLONOSCOPY (Pts 45-35yrs Insurance coverage will need to be confirmed)  10/03/2019   HIV Screening  02/20/2023 (Originally 07/13/1975)   MAMMOGRAM  12/06/2020   TETANUS/TDAP  10/29/2024   INFLUENZA VACCINE  Completed   COVID-19 Vaccine  Completed   Hepatitis C Screening  Completed   Pneumococcal Vaccine 37-16 Years old  Aged Out   HPV VACCINES  Aged Out    Health Maintenance  Health Maintenance Due  Topic Date Due   Zoster Vaccines- Shingrix (1 of 2) Never done   PAP SMEAR-Modifier  08/26/2017   COLONOSCOPY (Pts 45-36yrs Insurance coverage will need to be confirmed)  10/03/2019    Colorectal Cancer screening: Due, last exam 10/02/09,discussed with PCP on scheduling next appointment  Mammogram status: Due, last exam 12/07/19,  appointment scheduled 12/15/20  Bone Density status: Not yet eligible  Lung Cancer Screening: (Low Dose CT Chest recommended if Age 59-80 years, 30 pack-year currently smoking OR have quit w/in 15years.) does not qualify.     Additional Screening:  Hepatitis C Screening: does qualify; Completed 10/23/14  Vision Screening: Recommended annual ophthalmology exams for early detection of glaucoma and other disorders of the eye. Is the patient  up to date with their annual eye exam?  No , patient plans on rescheduling appointment Who is the provider or what is the name of the office in which the patient attends annual eye exams? Dr. Corey Skains   Dental Screening: Recommended annual dental exams for proper oral hygiene  Community Resource Referral / Chronic Care Management: CRR required this visit?  No   CCM required this visit?  No      Plan:     I have personally reviewed and noted the following in the patient's chart:   Medical and social history Use of alcohol, tobacco or illicit drugs  Current medications and supplements including opioid prescriptions.  Functional ability and status Nutritional status Physical activity Advanced directives List of other physicians Hospitalizations, surgeries, and ER visits in previous 12 months Vitals Screenings to include cognitive, depression, and falls Referrals and appointments  In addition, I have reviewed and discussed with patient certain preventive protocols, quality metrics, and best practice recommendations. A written personalized care plan for preventive services as well as general preventive health recommendations were provided to patient.   Due to this being a telephonic visit, the after visit summary with patients personalized plan was offered to patient via mail or my-chart. Patient would like to access on my-chart.   Janne Lab, LPN  02/54/[id number]   Nurse Health Advisor  Nurse Notes: None

## 2020-12-03 ENCOUNTER — Ambulatory Visit (INDEPENDENT_AMBULATORY_CARE_PROVIDER_SITE_OTHER): Payer: PPO

## 2020-12-03 VITALS — Ht 60.0 in | Wt 315.0 lb

## 2020-12-03 DIAGNOSIS — Z0000 Encounter for general adult medical examination without abnormal findings: Secondary | ICD-10-CM | POA: Diagnosis not present

## 2020-12-03 NOTE — Patient Instructions (Signed)
Monica Madden , Thank you for taking time to complete your Medicare Wellness Visit. I appreciate your ongoing commitment to your health goals. Please review the following plan we discussed and let me know if I can assist you in the future.   Screening recommendations/referrals: Colonoscopy: Due, last exam 10/02/09, discussed with PCP last visit on scheduling next exam Mammogram: Due, last exam 12/07/19, scheduled for 12/15/20 Bone Density: not yet eligible Recommended yearly ophthalmology/optometry visit for glaucoma screening and checkup Recommended yearly dental visit for hygiene and checkup  Vaccinations: Influenza vaccine: up to date Pneumococcal vaccine: up to date Tdap vaccine: up to date, completed 10/30/14, due 10/29/24 Shingles vaccine: Discuss with your local pharmacy  Covid-19: up to date  Advanced directives: Please bring a copy of Living Will and/or [place] for your chart.   Conditions/risks identified: see problem list  Next appointment: Follow up in one year for your annual wellness visit. 12/09/21 @ 11:15am , this will be a telephone visit  Preventive Care 40-64 Years, Female Preventive care refers to lifestyle choices and visits with your health care provider that can promote health and wellness. What does preventive care include? A yearly physical exam. This is also called an annual well check. Dental exams once or twice a year. Routine eye exams. Ask your health care provider how often you should have your eyes checked. Personal lifestyle choices, including: Daily care of your teeth and gums. Regular physical activity. Eating a healthy diet. Avoiding tobacco and drug use. Limiting alcohol use. Practicing safe sex. Taking low-dose aspirin daily starting at age 40. Taking vitamin and mineral supplements as recommended by your health care provider. What happens during an annual well check? The services and screenings done by your health care  provider during your annual well check will depend on your age, overall health, lifestyle risk factors, and family history of disease. Counseling  Your health care provider may ask you questions about your: Alcohol use. Tobacco use. Drug use. Emotional well-being. Home and relationship well-being. Sexual activity. Eating habits. Work and work Statistician. Method of birth control. Menstrual cycle. Pregnancy history. Screening  You may have the following tests or measurements: Height, weight, and BMI. Blood pressure. Lipid and cholesterol levels. These may be checked every 5 years, or more frequently if you are over 26 years old. Skin check. Lung cancer screening. You may have this screening every year starting at age 42 if you have a 30-pack-year history of smoking and currently smoke or have quit within the past 15 years. Fecal occult blood test (FOBT) of the stool. You may have this test every year starting at age 18. Flexible sigmoidoscopy or colonoscopy. You may have a sigmoidoscopy every 5 years or a colonoscopy every 10 years starting at age 72. Hepatitis C blood test. Hepatitis B blood test. Sexually transmitted disease (STD) testing. Diabetes screening. This is done by checking your blood sugar (glucose) after you have not eaten for a while (fasting). You may have this done every 1-3 years. Mammogram. This may be done every 1-2 years. Talk to your health care provider about when you should start having regular mammograms. This may depend on whether you have a family history of breast cancer. BRCA-related cancer screening. This may be done if you have a family history of breast, ovarian, tubal, or peritoneal cancers. Pelvic exam and Pap test. This may be done every 3 years starting at age 19. Starting at age 72, this may be done every 5 years if  you have a Pap test in combination with an HPV test. Bone density scan. This is done to screen for osteoporosis. You may have this scan  if you are at high risk for osteoporosis. Discuss your test results, treatment options, and if necessary, the need for more tests with your health care provider. Vaccines  Your health care provider may recommend certain vaccines, such as: Influenza vaccine. This is recommended every year. Tetanus, diphtheria, and acellular pertussis (Tdap, Td) vaccine. You may need a Td booster every 10 years. Zoster vaccine. You may need this after age 66. Pneumococcal 13-valent conjugate (PCV13) vaccine. You may need this if you have certain conditions and were not previously vaccinated. Pneumococcal polysaccharide (PPSV23) vaccine. You may need one or two doses if you smoke cigarettes or if you have certain conditions. Talk to your health care provider about which screenings and vaccines you need and how often you need them. This information is not intended to replace advice given to you by your health care provider. Make sure you discuss any questions you have with your health care provider. Document Released: 01/31/2015 Document Revised: 09/24/2015 Document Reviewed: 11/05/2014 Elsevier Interactive Patient Education  2017 [employer] Prevention in the Home Falls can cause injuries. They can happen to people of all ages. There are many things you can do to make your home safe and to help prevent falls. What can I do on the outside of my home? Regularly fix the edges of walkways and driveways and fix any cracks. Remove anything that might make you trip as you walk through a door, such as a raised step or threshold. Trim any bushes or trees on the path to your home. Use bright outdoor lighting. Clear any walking paths of anything that might make someone trip, such as rocks or tools. Regularly check to see if handrails are loose or broken. Make sure that both sides of any steps have handrails. Any raised decks and porches should have guardrails on the edges. Have any leaves, snow, or ice cleared  regularly. Use sand or salt on walking paths during winter. Clean up any spills in your garage right away. This includes oil or grease spills. What can I do in the bathroom? Use night lights. Install grab bars by the toilet and in the tub and shower. Do not use towel bars as grab bars. Use non-skid mats or decals in the tub or shower. If you need to sit down in the shower, use a plastic, non-slip stool. Keep the floor dry. Clean up any water that spills on the floor as soon as it happens. Remove soap buildup in the tub or shower regularly. Attach bath mats securely with double-sided non-slip rug tape. Do not have throw rugs and other things on the floor that can make you trip. What can I do in the bedroom? Use night lights. Make sure that you have a light by your bed that is easy to reach. Do not use any sheets or blankets that are too big for your bed. They should not hang down onto the floor. Have a firm chair that has side arms. You can use this for support while you get dressed. Do not have throw rugs and other things on the floor that can make you trip. What can I do in the kitchen? Clean up any spills right away. Avoid walking on wet floors. Keep items that you use a lot in easy-to-reach places. If you need to reach something above  you, use a strong step stool that has a grab bar. Keep electrical cords out of the way. Do not use floor polish or wax that makes floors slippery. If you must use wax, use non-skid floor wax. Do not have throw rugs and other things on the floor that can make you trip. What can I do with my stairs? Do not leave any items on the stairs. Make sure that there are handrails on both sides of the stairs and use them. Fix handrails that are broken or loose. Make sure that handrails are as long as the stairways. Check any carpeting to make sure that it is firmly attached to the stairs. Fix any carpet that is loose or worn. Avoid having throw rugs at the top or  bottom of the stairs. If you do have throw rugs, attach them to the floor with carpet tape. Make sure that you have a light switch at the top of the stairs and the bottom of the stairs. If you do not have them, ask someone to add them for you. What else can I do to help prevent falls? Wear shoes that: Do not have high heels. Have rubber bottoms. Are comfortable and fit you well. Are closed at the toe. Do not wear sandals. If you use a stepladder: Make sure that it is fully opened. Do not climb a closed stepladder. Make sure that both sides of the stepladder are locked into place. Ask someone to hold it for you, if possible. Clearly mark and make sure that you can see: Any grab bars or handrails. First and last steps. Where the edge of each step is. Use tools that help you move around (mobility aids) if they are needed. These include: Canes. Walkers. Scooters. Crutches. Turn on the lights when you go into a dark area. Replace any light bulbs as soon as they burn out. Set up your furniture so you have a clear path. Avoid moving your furniture around. If any of your floors are uneven, fix them. If there are any pets around you, be aware of where they are. Review your medicines with your doctor. Some medicines can make you feel dizzy. This can increase your chance of falling. Ask your doctor what other things that you can do to help prevent falls. This information is not intended to replace advice given to you by your health care provider. Make sure you discuss any questions you have with your health care provider. Document Released: 10/31/2008 Document Revised: 06/12/2015 Document Reviewed: 02/08/2014 Elsevier Interactive Patient Education  2017 [employer] Inc[employer]  

## 2020-12-04 ENCOUNTER — Encounter: Payer: PPO | Admitting: Family Medicine

## 2020-12-14 ENCOUNTER — Other Ambulatory Visit: Payer: Self-pay | Admitting: Family Medicine

## 2020-12-15 ENCOUNTER — Other Ambulatory Visit: Payer: Self-pay

## 2020-12-15 ENCOUNTER — Ambulatory Visit
Admission: RE | Admit: 2020-12-15 | Discharge: 2020-12-15 | Disposition: A | Discharge status: Home / Self Care | Payer: PPO | Source: Ambulatory Visit | Attending: Family Medicine | Admitting: Family Medicine

## 2020-12-15 DIAGNOSIS — Z1231 Encounter for screening mammogram for malignant neoplasm of breast: Secondary | ICD-10-CM | POA: Diagnosis not present

## 2020-12-19 ENCOUNTER — Other Ambulatory Visit: Payer: Self-pay | Admitting: Family Medicine

## 2021-01-19 ENCOUNTER — Other Ambulatory Visit: Payer: Self-pay | Admitting: Family Medicine

## 2021-01-29 ENCOUNTER — Other Ambulatory Visit: Payer: PPO

## 2021-02-05 ENCOUNTER — Encounter: Payer: PPO | Admitting: Family Medicine

## 2021-02-09 ENCOUNTER — Encounter: Payer: Self-pay | Admitting: Family Medicine

## 2021-02-09 ENCOUNTER — Other Ambulatory Visit: Payer: Self-pay

## 2021-02-09 ENCOUNTER — Ambulatory Visit (INDEPENDENT_AMBULATORY_CARE_PROVIDER_SITE_OTHER): Payer: PPO | Admitting: Family Medicine

## 2021-02-09 DIAGNOSIS — G8929 Other chronic pain: Secondary | ICD-10-CM

## 2021-02-09 MED ORDER — HYDROCODONE-ACETAMINOPHEN 5-325 MG PO TABS: 1.0000 | ORAL_TABLET | Freq: Four times a day (QID) | ORAL | 0 refills | Status: DC | PRN

## 2021-02-09 NOTE — Patient Instructions (Signed)
Continue current medicines without change Take care of yourself  Be as active as you can be with caution for falls and sedation   We will see you next month as planned

## 2021-02-09 NOTE — Assessment & Plan Note (Signed)
Overall stable  Continues ortho care for knees and shoulder  Takes norco 5-325 mg up to every 6 hours (90 per mo) Tolerating well w/o side eff UDS and contract utd Rev NCCSRS without concerns or red flags  3 px sent to her pharmacy to fill after 02/12/21, 03/15/21 and 04/12/21  Will f/u in 3 mo   (has in person visit in feb for annual exam)

## 2021-02-09 NOTE — Progress Notes (Signed)
Virtual Visit via Telephone Note  I connected with Monica Madden on 02/09/21 at 10:30 AM EST by telephone and verified that I am speaking with the correct person using two identifiers.  Location: Patient: home Provider: office   I discussed the limitations, risks, security and privacy concerns of performing an evaluation and management service by telephone and the availability of in person appointments. I also discussed with the patient that there may be a patient responsible charge related to this service. The patient expressed understanding and agreed to proceed.  Parties involved in encounter  Patient: Monica Madden  Provider:  Roxy Manns MD   History of Present Illness: Pt presents for encounter for chronic pain management   Indication for chronic opioid: pain from OA in shoulders and knees  Has not been doing anything much different Pain is still bad /changes in weather makes it worse  Stays home   Home bound-her father can drive    Sees Dr Floyce Stakes for ortho  Will make appt in February -due for shots  L knee and R shoulder are the worst   Thinking about a knee replacement- it may be in the next year    Medication and dose: norco 5-325 mg up to every 6 hours   # pills per month: 90   Last UDS date: 06/04/20   Opioid Treatment Agreement signed (Y/N): yes   Opioid Treatment Agreement last reviewed with patient:   06/04/20   NCCSRS reviewed this encounter (include red flags):  reviewed today, no red flags  Her last refill was on 01/12/21 She also takes alprazolam   Patient Active Problem List   Diagnosis Date Noted   Paresthesia of both hands 08/06/2020   Medicare annual wellness visit, subsequent 12/01/2018   Encounter for chronic pain management 02/07/2017   Chronic pain 11/17/2015   Elevated glucose level 11/09/2015   Routine general medical examination at a health care facility 06/30/2011   Amenorrhea 06/30/2011   Adverse effects of medication  07/01/2010   DYSPEPSIA 09/26/2009   IRRITABLE BOWEL SYNDROME 08/07/2009   KNEE PAIN, BILATERAL 08/07/2009   CONSTIPATION 01/15/2009   LEG PAIN, LEFT 10/09/2008   Hyperlipidemia 05/09/2008   Essential hypertension 03/29/2008   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 06/05/2007   INCI HERNIA WITHOUT MENTION OBSTRUCTION/GANGRENE 05/12/2007   FREQUENCY, URINARY 05/12/2007   ARTHRALGIA 11/03/2006   DEPENDENT EDEMA, LEGS 11/03/2006   Morbid obesity (HCC) 07/15/2006   ANXIETY DEPRESSION 07/15/2006   MIGRAINE HEADACHE 07/15/2006   RETINITIS PIGMENTOSA 07/15/2006   VARICOSE VEINS, LOWER EXTREMITIES 07/15/2006   ALLERGIC RHINITIS 07/15/2006   ASTHMA 07/15/2006   GERD 07/15/2006   BACK PAIN, LUMBAR, CHRONIC 07/15/2006   INCONTINENCE, URGE 07/15/2006   Past Medical History:  Diagnosis Date   Allergy    allergic rhinitis   Anemia    Anxiety    ATTACKS   Arthritis    Asthma    Cataract    left cataract removal 12/2008   Chronic back pain    OA of spine   Depression    GERD (gastroesophageal reflux disease)    Endo negative 02/2000   Hypertension 03/2008   IBS (irritable bowel syndrome)    MRSA infection 2008   Right leg   Obesity    Ovarian cyst    Retinitis pigmentosa    blind   Past Surgical History:  Procedure Laterality Date   APPENDECTOMY     BLADDER SURGERY  1968   age 62 ? dilitation  CATARACT EXTRACTION W/PHACO Right 11/11/2014   Procedure: CATARACT EXTRACTION PHACO AND INTRAOCULAR LENS PLACEMENT (IOC);  Surgeon: Sallee LangeSteven Dingeldein, MD;  Location: [facility];  Service: Ophthalmology;  Laterality: Right;  LOT PACK: [id number] H US: 00:29.7 AP: 11.2 CDE: 7.14   CESAREAN SECTION     CHOLECYSTECTOMY     CYSTOSCOPY  03/2008   negative// Dr. Wanda PlumpHumphries urologist   ENDOMETRIAL BIOPSY  07/1998   negative   EYE SURGERY  12/2008   cataract removal left   HERNIA REPAIR  05/2006   ruptured hernia repair after fall and then two more surgeries within as many weeks for  complications(05/2006) Umbilical hernia repair (06/1997)   KNEE ARTHROSCOPY W/ MENISCAL REPAIR     left knne   Social History   Tobacco Use   Smoking status: Never   Smokeless tobacco: Never  Substance Use Topics   Alcohol use: Yes    Comment: twice a year   Drug use: Yes    Types: Marijuana   Family History  Problem Relation Age of Onset   Depression Mother    Heart disease Father    Diabetes Maternal Uncle    Hypertension Maternal Grandmother    Heart disease Paternal Grandmother    Heart disease Paternal Grandfather    Depression Brother        Depression secondary to MVA injuries   Hypertension Brother    Breast cancer Neg Hx    Allergies  Allergen Reactions   Diclofenac Sodium     REACTION: allergic/ made asthma bad   Etodolac     REACTION: GI   Metronidazole     REACTION: GI side eff   Nsaids Other (See Comments)    GI Upset   Oxybutynin Itching   Sulfamethoxazole-Trimethoprim    Tolterodine Tartrate     REACTION: None Effective   Tramadol Other (See Comments)    Caused elevated BP   Sulfa Antibiotics Itching and Rash   Current Outpatient Medications on File Prior to Visit  Medication Sig Dispense Refill   Albuterol Sulfate (PROAIR HFA IN) Inhale 2 puffs into the lungs every 6 (six) hours as needed.     ALLERGY RELIEF 180 MG tablet TAKE 1 TABLET BY MOUTH EVERY DAY 90 tablet 1   ALPRAZolam (XANAX) 1 MG tablet TAKE 1 TABLET(1 MG) BY MOUTH THREE TIMES DAILY AS NEEDED 90 tablet 3   Ascorbic Acid (VITAMIN C) 1000 MG tablet Take 1,000 mg by mouth daily.     Black Elderberry,Berry-Flower, 575 MG CAPS Take 1 capsule by mouth daily.     Calcium Carbonate-Vitamin D 600-400 MG-UNIT tablet Take 1 tablet by mouth daily.     Cholecalciferol (VITAMIN D3 PO) Take 1,000 Units by mouth daily.     Coenzyme Q10 (CO Q 10 PO) Take 1 tablet by mouth daily.     cyclobenzaprine (FLEXERIL) 10 MG tablet Take 10 mg by mouth 3 (three) times daily as needed for muscle spasms.      fluticasone (FLONASE) 50 MCG/ACT nasal spray SHAKE LIQUID AND USE 2 SPRAYS IN EACH NOSTRIL TWICE DAILY 48 g 1   lisinopril (ZESTRIL) 5 MG tablet TAKE 1 TABLET(5 MG) BY MOUTH EVERY MORNING 90 tablet 1   montelukast (SINGULAIR) 10 MG tablet TAKE 1 TABLET BY MOUTH EVERY DAY 90 tablet 1   Omega 3-6-9 Fatty Acids (OMEGA 3-6-9 COMPLEX PO) Take one tablet by mouth daily     omeprazole (PRILOSEC) 40 MG capsule TAKE 1 CAPSULE BY MOUTH EVERY DAY 90 capsule 1  OVER THE COUNTER MEDICATION daily as needed. HYLAND'S CELL SALTS #8 MAGNESIA PHOSPHORICA     rosuvastatin (CRESTOR) 5 MG tablet TAKE 1 TABLET(5 MG) BY MOUTH DAILY 90 tablet 1   sertraline (ZOLOFT) 50 MG tablet TAKE 1 TABLET(50 MG) BY MOUTH DAILY 90 tablet 1   tolterodine (DETROL LA) 4 MG 24 hr capsule TAKE 1 CAPSULE(4 MG) BY MOUTH DAILY AS NEEDED 90 capsule 0   No current facility-administered medications on file prior to visit.   Review of Systems  Constitutional:  Negative for chills, fever and malaise/fatigue.  HENT:  Negative for congestion, ear pain, sinus pain and sore throat.   Eyes:  Negative for blurred vision, discharge and redness.  Respiratory:  Negative for cough, shortness of breath and stridor.   Cardiovascular:  Negative for chest pain, palpitations and leg swelling.  Gastrointestinal:  Negative for abdominal pain, diarrhea, nausea and vomiting.  Musculoskeletal:  Positive for back pain and joint pain. Negative for myalgias.  Skin:  Negative for rash.  Neurological:  Negative for dizziness and headaches.     Observations/Objective: Pt sounds well  Like her usual self  Not distressed  Nl mood- not tearful  Nl cognition/ good historian Talkative, voices frustration over her limitations re: joints and vision   Assessment and Plan: Problem List Items Addressed This Visit       Other   Chronic pain    Knees and shoulders  Chronic pain enc done today  Continues orthop care with injections   May need L knee repl this year        Relevant Medications   HYDROcodone-acetaminophen (NORCO) 5-325 MG tablet   HYDROcodone-acetaminophen (NORCO) 5-325 MG tablet   HYDROcodone-acetaminophen (NORCO/VICODIN) 5-325 MG tablet   Encounter for chronic pain management - Primary    Overall stable  Continues ortho care for knees and shoulder  Takes norco 5-325 mg up to every 6 hours (90 per mo) Tolerating well w/o side eff UDS and contract utd Rev NCCSRS without concerns or red flags  3 px sent to her pharmacy to fill after 02/12/21, 03/15/21 and 04/12/21  Will f/u in 3 mo   (has in person visit in feb for annual exam)         Follow Up Instructions: Continue current medicines without change Take care of yourself  Be as active as you can be with caution for falls and sedation   We will see you next month as planned    I discussed the assessment and treatment plan with the patient. The patient was provided an opportunity to ask questions and all were answered. The patient agreed with the plan and demonstrated an understanding of the instructions.   The patient was advised to call back or seek an in-person evaluation if the symptoms worsen or if the condition fails to improve as anticipated.  I provided 16 minutes of non-face-to-face time during this encounter.   Roxy Manns, MD

## 2021-02-09 NOTE — Assessment & Plan Note (Signed)
Knees and shoulders  Chronic pain enc done today  Continues orthop care with injections   May need L knee repl this year

## 2021-02-11 ENCOUNTER — Other Ambulatory Visit: Payer: Self-pay | Admitting: Family Medicine

## 2021-02-12 NOTE — Telephone Encounter (Signed)
Name of Medication: xanax Name of Pharmacy: [facility] [address] Last Fill or Written Date and Quantity: 10/15/20 #90 tabs 3 refills Last Office Visit and Type: Stop Act (phone call) 02/09/21 Next Office Visit and Type: CPE on 03/17/21 Last Controlled Substance Agreement Date: 06/04/20 Last UDS: 06/04/20

## 2021-03-09 ENCOUNTER — Telehealth: Payer: Self-pay | Admitting: Family Medicine

## 2021-03-09 DIAGNOSIS — I10 Essential (primary) hypertension: Secondary | ICD-10-CM

## 2021-03-09 DIAGNOSIS — R7309 Other abnormal glucose: Secondary | ICD-10-CM

## 2021-03-09 DIAGNOSIS — E7800 Pure hypercholesterolemia, unspecified: Secondary | ICD-10-CM

## 2021-03-09 NOTE — Telephone Encounter (Signed)
-----   Message from Ellamae Sia sent at 02/23/2021  9:24 AM EST ----- Regarding: lab orders for Tuesday, 2.21.23 Patient is scheduled for CPX labs, please order future labs, Thanks , Karna Christmas

## 2021-03-10 ENCOUNTER — Other Ambulatory Visit (INDEPENDENT_AMBULATORY_CARE_PROVIDER_SITE_OTHER): Payer: PPO

## 2021-03-10 ENCOUNTER — Other Ambulatory Visit: Payer: Self-pay

## 2021-03-10 DIAGNOSIS — R7309 Other abnormal glucose: Secondary | ICD-10-CM

## 2021-03-10 DIAGNOSIS — E7800 Pure hypercholesterolemia, unspecified: Secondary | ICD-10-CM

## 2021-03-10 DIAGNOSIS — I10 Essential (primary) hypertension: Secondary | ICD-10-CM | POA: Diagnosis not present

## 2021-03-10 LAB — CBC WITH DIFFERENTIAL/PLATELET
Basophils Absolute: 0.1 10*3/uL (ref 0.0–0.1)
Basophils Relative: 1.2 % (ref 0.0–3.0)
Eosinophils Absolute: 0.3 10*3/uL (ref 0.0–0.7)
Eosinophils Relative: 3 % (ref 0.0–5.0)
HCT: 39.1 % (ref 36.0–46.0)
Hemoglobin: 12.8 g/dL (ref 12.0–15.0)
Lymphocytes Relative: 24 % (ref 12.0–46.0)
Lymphs Abs: 2.1 10*3/uL (ref 0.7–4.0)
MCHC: 32.8 g/dL (ref 30.0–36.0)
MCV: 88.3 fl (ref 78.0–100.0)
Monocytes Absolute: 0.6 10*3/uL (ref 0.1–1.0)
Monocytes Relative: 7.2 % (ref 3.0–12.0)
Neutro Abs: 5.7 10*3/uL (ref 1.4–7.7)
Neutrophils Relative %: 64.6 % (ref 43.0–77.0)
Platelets: 266 10*3/uL (ref 150.0–400.0)
RBC: 4.43 Mil/uL (ref 3.87–5.11)
RDW: 13.9 % (ref 11.5–15.5)
WBC: 8.8 10*3/uL (ref 4.0–10.5)

## 2021-03-10 LAB — COMPREHENSIVE METABOLIC PANEL
ALT: 20 U/L (ref 0–35)
AST: 17 U/L (ref 0–37)
Albumin: 4.5 g/dL (ref 3.5–5.2)
Alkaline Phosphatase: 68 U/L (ref 39–117)
BUN: 15 mg/dL (ref 6–23)
CO2: 35 mEq/L — ABNORMAL HIGH (ref 19–32)
Calcium: 9.9 mg/dL (ref 8.4–10.5)
Chloride: 98 mEq/L (ref 96–112)
Creatinine, Ser: 0.73 mg/dL (ref 0.40–1.20)
GFR: 90.5 mL/min (ref >60.00)
Glucose, Bld: 107 mg/dL — ABNORMAL HIGH (ref 70–99)
Potassium: 4.5 mEq/L (ref 3.5–5.1)
Sodium: 139 mEq/L (ref 135–145)
Total Bilirubin: 0.7 mg/dL (ref 0.2–1.2)
Total Protein: 7.3 g/dL (ref 6.0–8.3)

## 2021-03-10 LAB — LIPID PANEL
Cholesterol: 160 mg/dL (ref 0–200)
HDL: 46.6 mg/dL (ref >39.00)
LDL Cholesterol: 75 mg/dL (ref 0–99)
NonHDL: 112.91
Total CHOL/HDL Ratio: 3
Triglycerides: 188 mg/dL — ABNORMAL HIGH (ref 0.0–149.0)
VLDL: 37.6 mg/dL (ref 0.0–40.0)

## 2021-03-10 LAB — HEMOGLOBIN A1C: Hgb A1c MFr Bld: 5.9 % (ref 4.6–6.5)

## 2021-03-10 LAB — TSH: TSH: 1.44 u[IU]/mL (ref 0.35–5.50)

## 2021-03-17 ENCOUNTER — Encounter: Payer: Self-pay | Admitting: Family Medicine

## 2021-03-17 ENCOUNTER — Other Ambulatory Visit: Payer: Self-pay

## 2021-03-17 ENCOUNTER — Ambulatory Visit (INDEPENDENT_AMBULATORY_CARE_PROVIDER_SITE_OTHER): Payer: PPO | Admitting: Family Medicine

## 2021-03-17 VITALS — BP 134/68 | HR 99 | Temp 97.9°F | Ht 61.0 in | Wt 321.1 lb

## 2021-03-17 DIAGNOSIS — Z0000 Encounter for general adult medical examination without abnormal findings: Secondary | ICD-10-CM

## 2021-03-17 DIAGNOSIS — E7800 Pure hypercholesterolemia, unspecified: Secondary | ICD-10-CM | POA: Diagnosis not present

## 2021-03-17 DIAGNOSIS — R7309 Other abnormal glucose: Secondary | ICD-10-CM

## 2021-03-17 DIAGNOSIS — F341 Dysthymic disorder: Secondary | ICD-10-CM

## 2021-03-17 DIAGNOSIS — I10 Essential (primary) hypertension: Secondary | ICD-10-CM

## 2021-03-17 DIAGNOSIS — Z1211 Encounter for screening for malignant neoplasm of colon: Secondary | ICD-10-CM | POA: Diagnosis not present

## 2021-03-17 MED ORDER — ROSUVASTATIN CALCIUM 5 MG PO TABS: ORAL_TABLET | ORAL | 3 refills | Status: DC

## 2021-03-17 NOTE — Assessment & Plan Note (Signed)
Disc goals for lipids and reasons to control them Rev last labs with pt Rev low sat fat diet in detail Much improvement with crestor 5 mg (LDL is down to 75)  However pt has myalgias and this may be a side effect  inst to hold statin for a week and report back

## 2021-03-17 NOTE — Assessment & Plan Note (Signed)
bp in fair control at this time  BP Readings from Last 1 Encounters:  03/17/21 134/68   No changes needed Most recent labs reviewed  Disc lifstyle change with low sodium diet and exercise  Plan to continue lisinopril 5 mg daily

## 2021-03-17 NOTE — Assessment & Plan Note (Signed)
D/w patient [id number] for colon cancer screening, including IFOB vs. colonoscopy.  Risks and benefits of both were discussed and patient voiced understanding.  Pt elects for: cologuard (this was ordered)

## 2021-03-17 NOTE — Assessment & Plan Note (Signed)
Discussed how this problem influences overall health and the risks it imposes  Reviewed plan for weight loss with lower calorie diet (via better food choices and also portion control or program like weight watchers) and exercise building up to or more than 30 minutes 5 days per week including some aerobic activity    

## 2021-03-17 NOTE — Assessment & Plan Note (Signed)
Lab Results  Component Value Date   HGBA1C 5.9 03/10/2021   This is stable  disc imp of low glycemic diet and wt loss to prevent DM2  Encouraged to work on stress eating

## 2021-03-17 NOTE — Assessment & Plan Note (Signed)
Last pHQ 0 Mood is good  More stressors lately Consider counseling for stress eating

## 2021-03-17 NOTE — Assessment & Plan Note (Signed)
Reviewed health habits including diet and exercise and skin cancer prevention Reviewed appropriate screening tests for age  Also reviewed health mt list, fam hx and immunization status , as well as social and family history   See HPI Labs reviewed  amw reviewed  Plans to check on coverage of shingrix  Plans to f/u with gyn for annual visit with pap cologuard ordered for screening Mammogram utd

## 2021-03-17 NOTE — Patient Instructions (Addendum)
If you are interested in the new shingles vaccine (Shingrix) - call your local pharmacy to check on coverage and availability  If affordable, get on a wait list at your pharmacy to get the vaccine.  Touch base with gyn/ Dr Shawnie Pons , you are due for a gyn visit   Keep thinking about counseling for stress and stress eating   Follow up with your pulmonologist for a follow up   Hold the cholesterol medicine (crestor) for a week  If your pain greatly improves let me know and don't pick it up  If not-go ahead and take   I ordered the cologuard test for colon cancer screening  If you don't hear back in 2 weeks let us know   Keep working on a healthy diet

## 2021-03-17 NOTE — Progress Notes (Signed)
Subjective:    Patient ID: Monica Madden, female    DOB: 1960/05/06, 61 y.o.   MRN: [id number]  This visit occurred during the SARS-CoV-2 public health emergency.  Safety protocols were in place, including screening questions prior to the visit, additional usage of staff PPE, and extensive cleaning of exam room while observing appropriate contact time as indicated for disinfecting solutions.   HPI Here for health maintenance exam and to review chronic medical problems    Wt Readings from Last 3 Encounters:  03/17/21 (!) 321 lb 2 oz (145.7 kg)  12/03/20 (!) 315 lb (142.9 kg)  11/06/20 (!) 315 lb (142.9 kg)   60.68 kg/m  Trying to take care of herself  Limited with exercise due to her knee problems and shoulder problems  A lot of chronic pain/all over now    (more in hands and feet also) Some walking inside of her house  Does the best she can (3 times per week 30 min)   More low back pain in past 3 months  No changes in urination  Has had sciatica in the past  No warm or swollen or red joints   Legs/ankle swell only when standing for a long time in the kitchen   Had amw in the fall/reviewed   Zoster status : interested if covered  Pap 2016  Sees gyn No periods at all /no vaginal bleeding   Colonoscopy was 2011 Interested in cologuard  Hard to get transportation     Mammogram 11/2020 Self breast exam  Tdap 10/2014 Flu shot 09/2020 Covid vaccinated -booster in fall   Has not seen pulmonary in a while  Asthma bothers her occ  Using inhaler more recently  Has not had a breathing treatment recently   HTN bp is stable today  No cp or palpitations or headaches or edema  No side effects to medicines  BP Readings from Last 3 Encounters:  03/17/21 134/68  12/03/19 114/68  12/01/18 128/82    Lisinopril 5 mg daily   Lab Results  Component Value Date   CREATININE 0.73 03/10/2021   BUN 15 03/10/2021   NA 139 03/10/2021   K 4.5 03/10/2021   CL 98 03/10/2021    CO2 35 (H) 03/10/2021    Elevated glucose 107 fasting  Lab Results  Component Value Date   HGBA1C 5.9 03/10/2021  Stable  Some stress eating recently  (knows what she needs to eat)  She follows this  Family stressors   Hyperlipidemia Lab Results  Component Value Date   CHOL 160 03/10/2021   CHOL 217 (H) 11/26/2019   CHOL 208 (H) 11/24/2018   Lab Results  Component Value Date   HDL 46.60 03/10/2021   HDL 44.60 11/26/2019   HDL 41.40 11/24/2018   Lab Results  Component Value Date   LDLCALC 75 03/10/2021   LDLCALC 137 (H) 11/26/2019   LDLCALC 124 (H) 11/10/2016   Lab Results  Component Value Date   TRIG 188.0 (H) 03/10/2021   TRIG 177.0 (H) 11/26/2019   TRIG 212.0 (H) 11/24/2018   Lab Results  Component Value Date   CHOLHDL 3 03/10/2021   CHOLHDL 5 11/26/2019   CHOLHDL 5 11/24/2018   Lab Results  Component Value Date   LDLDIRECT 140.0 11/24/2018   LDLDIRECT 135.0 11/22/2017   LDLDIRECT 139.0 11/11/2015   Crestor 5 mg daily  LDL down to 75     Mood  Depression screen [facility] 2/9 12/03/2020 12/03/2019 12/01/2018 12/01/2018 05/05/2018  Decreased Interest 0 0 0 0 0  Down, Depressed, Hopeless 0 0 0 0 0  PHQ - 2 Score 0 0 0 0 0  Altered sleeping - - 1 - 2  Tired, decreased energy - - 0 - 1  Change in appetite - - 0 - 0  Feeling bad or failure about yourself  - - 0 - 1  Trouble concentrating - - 0 - 0  Moving slowly or fidgety/restless - - 0 - 0  Suicidal thoughts - - 0 - 0  PHQ-9 Score - - 1 - 4  Difficult doing work/chores - - Not difficult at all - Not difficult at all  Zoloft Xanax   Takes ca and D for bone health   Other labs Lab Results  Component Value Date   ALT 20 03/10/2021   AST 17 03/10/2021   ALKPHOS 68 03/10/2021   BILITOT 0.7 03/10/2021   Lab Results  Component Value Date   WBC 8.8 03/10/2021   HGB 12.8 03/10/2021   HCT 39.1 03/10/2021   MCV 88.3 03/10/2021   PLT 266.0 03/10/2021   Lab Results  Component Value Date    TSH 1.44 03/10/2021     Patient Active Problem List   Diagnosis Date Noted   Colon cancer screening 03/17/2021   Paresthesia of both hands 08/06/2020   Medicare annual wellness visit, subsequent 12/01/2018   Encounter for chronic pain management 02/07/2017   Chronic pain 11/17/2015   Elevated glucose level 11/09/2015   Routine general medical examination at a health care facility 06/30/2011   Amenorrhea 06/30/2011   DYSPEPSIA 09/26/2009   IRRITABLE BOWEL SYNDROME 08/07/2009   KNEE PAIN, BILATERAL 08/07/2009   CONSTIPATION 01/15/2009   LEG PAIN, LEFT 10/09/2008   Hyperlipidemia 05/09/2008   Essential hypertension 03/29/2008   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 06/05/2007   INCI HERNIA WITHOUT MENTION OBSTRUCTION/GANGRENE 05/12/2007   FREQUENCY, URINARY 05/12/2007   ARTHRALGIA 11/03/2006   DEPENDENT EDEMA, LEGS 11/03/2006   Morbid obesity ([place]) 07/15/2006   ANXIETY DEPRESSION 07/15/2006   MIGRAINE HEADACHE 07/15/2006   RETINITIS PIGMENTOSA 07/15/2006   VARICOSE VEINS, LOWER EXTREMITIES 07/15/2006   ALLERGIC RHINITIS 07/15/2006   ASTHMA 07/15/2006   GERD 07/15/2006   BACK PAIN, LUMBAR, CHRONIC 07/15/2006   INCONTINENCE, URGE 07/15/2006   Past Medical History:  Diagnosis Date   Allergy    allergic rhinitis   Anemia    Anxiety    ATTACKS   Arthritis    Asthma    Cataract    left cataract removal 12/2008   Chronic back pain    OA of spine   Depression    GERD (gastroesophageal reflux disease)    Endo negative 02/2000   Hypertension 03/2008   IBS (irritable bowel syndrome)    MRSA infection 2008   Right leg   Obesity    Ovarian cyst    Retinitis pigmentosa    blind   Past Surgical History:  Procedure Laterality Date   APPENDECTOMY     BLADDER SURGERY  1968   age 42 ? dilitation   CATARACT EXTRACTION W/PHACO Right 11/11/2014   Procedure: CATARACT EXTRACTION PHACO AND INTRAOCULAR LENS PLACEMENT ([place]);  Surgeon: Estill Cotta, MD;  Location:  [facility] ORS;  Service: Ophthalmology;  Laterality: Right;  LOT PACK: [id number] H [place]: 00:29.7 AP: 11.2 CDE: 7.14   CESAREAN SECTION     CHOLECYSTECTOMY     CYSTOSCOPY  03/2008   negative// Dr. Reece Agar urologist   ENDOMETRIAL BIOPSY  07/1998  negative   EYE SURGERY  12/2008   cataract removal left   HERNIA REPAIR  05/2006   ruptured hernia repair after fall and then two more surgeries within as many weeks for [id number]) Umbilical hernia repair (06/1997)   KNEE ARTHROSCOPY W/ MENISCAL REPAIR     left knne   Social History   Tobacco Use   Smoking status: Never   Smokeless tobacco: Never  Substance Use Topics   Alcohol use: Yes    Comment: twice a year   Drug use: Yes    Types: Marijuana   Family History  Problem Relation Age of Onset   Depression Mother    Heart disease Father    Diabetes Maternal Uncle    Hypertension Maternal Grandmother    Heart disease Paternal Grandmother    Heart disease Paternal Grandfather    Depression Brother        Depression secondary to MVA injuries   Hypertension Brother    Breast cancer Neg Hx    Allergies  Allergen Reactions   Diclofenac Sodium     REACTION: allergic/ made asthma bad   Etodolac     REACTION: GI   Metronidazole     REACTION: GI side eff   Nsaids Other (See Comments)    GI Upset   Oxybutynin Itching   Sulfamethoxazole-Trimethoprim    Tolterodine Tartrate     REACTION: None Effective   Tramadol Other (See Comments)    Caused elevated BP   Sulfa Antibiotics Itching and Rash   Current Outpatient Medications on File Prior to Visit  Medication Sig Dispense Refill   Albuterol Sulfate (PROAIR HFA IN) Inhale 2 puffs into the lungs every 6 (six) hours as needed.     ALLERGY RELIEF 180 MG tablet TAKE 1 TABLET BY MOUTH EVERY DAY 90 tablet 1   ALPRAZolam (XANAX) 1 MG tablet TAKE 1 TABLET(1 MG) BY MOUTH THREE TIMES DAILY AS NEEDED 90 tablet 3   Ascorbic Acid (VITAMIN C) 1000 MG tablet Take 1,000 mg by mouth daily.      Black Elderberry,Berry-Flower, 575 MG CAPS Take 1 capsule by mouth daily.     Calcium Carbonate-Vitamin D 600-400 MG-UNIT tablet Take 1 tablet by mouth daily.     Cholecalciferol (VITAMIN D3 PO) Take 1,000 Units by mouth daily.     Coenzyme Q10 (CO Q 10 PO) Take 1 tablet by mouth daily.     cyclobenzaprine (FLEXERIL) 10 MG tablet Take 10 mg by mouth 3 (three) times daily as needed for muscle spasms.     fluticasone (FLONASE) 50 MCG/ACT nasal spray SHAKE LIQUID AND USE 2 SPRAYS IN EACH NOSTRIL TWICE DAILY 48 g 1   HYDROcodone-acetaminophen (NORCO) 5-325 MG tablet Take 1 tablet by mouth every 6 (six) hours as needed for moderate pain or severe pain. Do not fill before  04/12/21 90 tablet 0   HYDROcodone-acetaminophen (NORCO) 5-325 MG tablet Take 1 tablet by mouth every 6 (six) hours as needed for moderate pain or severe pain. Do not fill before 03/15/21 90 tablet 0   HYDROcodone-acetaminophen (NORCO/VICODIN) 5-325 MG tablet Take 1 tablet by mouth every 6 (six) hours as needed for moderate pain. Do not fill before 02/12/21 90 tablet 0   lisinopril (ZESTRIL) 5 MG tablet TAKE 1 TABLET(5 MG) BY MOUTH EVERY MORNING 90 tablet 1   montelukast (SINGULAIR) 10 MG tablet TAKE 1 TABLET BY MOUTH EVERY DAY 90 tablet 1   Omega 3-6-9 Fatty Acids (OMEGA 3-6-9 COMPLEX PO) Take one tablet  by mouth daily     omeprazole (PRILOSEC) 40 MG capsule TAKE 1 CAPSULE BY MOUTH EVERY DAY 90 capsule 1   OVER THE COUNTER MEDICATION daily as needed. HYLAND'S CELL SALTS #8 MAGNESIA PHOSPHORICA     sertraline (ZOLOFT) 50 MG tablet TAKE 1 TABLET(50 MG) BY MOUTH DAILY 90 tablet 1   tolterodine (DETROL LA) 4 MG 24 hr capsule TAKE 1 CAPSULE(4 MG) BY MOUTH DAILY AS NEEDED 90 capsule 0   No current facility-administered medications on file prior to visit.     Review of Systems  Constitutional:  Positive for fatigue. Negative for activity change, appetite change, fever and unexpected weight change.  HENT:  Negative for congestion, ear  pain, rhinorrhea, sinus pressure and sore throat.   Eyes:  Negative for pain, redness and visual disturbance.  Respiratory:  Negative for cough, shortness of breath and wheezing.   Cardiovascular:  Negative for chest pain and palpitations.       Legs swell only after standing   Gastrointestinal:  Negative for abdominal pain, blood in stool, constipation and diarrhea.  Endocrine: Negative for polydipsia and polyuria.  Genitourinary:  Negative for dysuria, frequency and urgency.  Musculoskeletal:  Positive for arthralgias. Negative for back pain and myalgias.  Skin:  Negative for pallor and rash.  Allergic/Immunologic: Negative for environmental allergies.  Neurological:  Negative for dizziness, syncope and headaches.  Hematological:  Negative for adenopathy. Does not bruise/bleed easily.  Psychiatric/Behavioral:  Negative for decreased concentration and dysphoric mood. The patient is not nervous/anxious.       Objective:   Physical Exam Constitutional:      General: She is not in acute distress.    Appearance: Normal appearance. She is well-developed. She is obese. She is not ill-appearing or diaphoretic.  HENT:     Head: Normocephalic and atraumatic.     Right Ear: Tympanic membrane, ear canal and external ear normal.     Left Ear: Tympanic membrane, ear canal and external ear normal.     Nose: Nose normal. No congestion.     Mouth/Throat:     Mouth: Mucous membranes are moist.     Pharynx: Oropharynx is clear. No posterior oropharyngeal erythema.  Eyes:     General: No scleral icterus.    Extraocular Movements: Extraocular movements intact.     Conjunctiva/sclera: Conjunctivae normal.     Pupils: Pupils are equal, round, and reactive to light.  Neck:     Thyroid: No thyromegaly.     Vascular: No carotid bruit or JVD.  Cardiovascular:     Rate and Rhythm: Normal rate and regular rhythm.     Pulses: Normal pulses.     Heart sounds: Normal heart sounds.    No gallop.   Pulmonary:     Effort: Pulmonary effort is normal. No respiratory distress.     Breath sounds: Normal breath sounds. No wheezing.     Comments: Good air exch Chest:     Chest wall: No tenderness.  Abdominal:     General: Bowel sounds are normal. There is no distension or abdominal bruit.     Palpations: Abdomen is soft. There is no mass.     Tenderness: There is no abdominal tenderness.     Hernia: No hernia is present.  Genitourinary:    Comments: Breast and pelvic exam  done by gyn provider Musculoskeletal:        General: No tenderness. Normal range of motion.     Cervical back: Normal range of motion  and neck supple. No rigidity. No muscular tenderness.     Right lower leg: No edema.     Left lower leg: No edema.  Lymphadenopathy:     Cervical: No cervical adenopathy.  Skin:    General: Skin is warm and dry.     Coloration: Skin is not pale.     Findings: No erythema or rash.     Comments: Solar lentigines diffusely Few sks  Neurological:     Mental Status: She is alert. Mental status is at baseline.     Cranial Nerves: No cranial nerve deficit.     Motor: No abnormal muscle tone.     Coordination: Coordination normal.     Gait: Gait normal.     Deep Tendon Reflexes: Reflexes are normal and symmetric. Reflexes normal.  Psychiatric:        Mood and Affect: Mood normal.        Cognition and Memory: Cognition and memory normal.          Assessment & Plan:   Problem List Items Addressed This Visit       Cardiovascular and Mediastinum   Essential hypertension    bp in fair control at this time  BP Readings from Last 1 Encounters:  03/17/21 134/68  No changes needed Most recent labs reviewed  Disc lifstyle change with low sodium diet and exercise  Plan to continue lisinopril 5 mg daily      Relevant Medications   rosuvastatin (CRESTOR) 5 MG tablet     Other   ANXIETY DEPRESSION    Last pHQ 0 Mood is good  More stressors lately Consider counseling for  stress eating       Colon cancer screening    D/w patient [id number] for colon cancer screening, including IFOB vs. colonoscopy.  Risks and benefits of both were discussed and patient voiced understanding.  Pt elects for: cologuard (this was ordered)       Relevant Orders   Cologuard   Elevated glucose level    Lab Results  Component Value Date   HGBA1C 5.9 03/10/2021  This is stable  disc imp of low glycemic diet and wt loss to prevent DM2  Encouraged to work on stress eating       Hyperlipidemia    Disc goals for lipids and reasons to control them Rev last labs with pt Rev low sat fat diet in detail Much improvement with crestor 5 mg (LDL is down to 75)  However pt has myalgias and this may be a side effect  inst to hold statin for a week and report back       Relevant Medications   rosuvastatin (CRESTOR) 5 MG tablet   Morbid obesity ([place])    Discussed how this problem influences overall health and the risks it imposes  Reviewed plan for weight loss with lower calorie diet (via better food choices and also portion control or program like weight watchers) and exercise building up to or more than 30 minutes 5 days per week including some aerobic activity         Routine general medical examination at a health care facility - Primary    Reviewed health habits including diet and exercise and skin cancer prevention Reviewed appropriate screening tests for age  Also reviewed health mt list, fam hx and immunization status , as well as social and family history   See HPI Labs reviewed  amw reviewed  Plans to check on coverage of shingrix  Plans to f/u with gyn for annual visit with pap cologuard ordered for screening Mammogram utd

## 2021-03-23 DIAGNOSIS — M19011 Primary osteoarthritis, right shoulder: Secondary | ICD-10-CM | POA: Diagnosis not present

## 2021-03-23 DIAGNOSIS — M1712 Unilateral primary osteoarthritis, left knee: Secondary | ICD-10-CM | POA: Diagnosis not present

## 2021-03-23 DIAGNOSIS — M7581 Other shoulder lesions, right shoulder: Secondary | ICD-10-CM | POA: Diagnosis not present

## 2021-03-29 DIAGNOSIS — Z1211 Encounter for screening for malignant neoplasm of colon: Secondary | ICD-10-CM | POA: Diagnosis not present

## 2021-03-31 ENCOUNTER — Encounter: Payer: Self-pay | Admitting: Family Medicine

## 2021-04-01 ENCOUNTER — Other Ambulatory Visit: Payer: Self-pay | Admitting: Family Medicine

## 2021-04-01 ENCOUNTER — Encounter: Payer: Self-pay | Admitting: Family Medicine

## 2021-04-06 LAB — COLOGUARD: COLOGUARD: NEGATIVE

## 2021-04-07 ENCOUNTER — Ambulatory Visit: Payer: PPO | Admitting: Nurse Practitioner

## 2021-04-08 ENCOUNTER — Ambulatory Visit (INDEPENDENT_AMBULATORY_CARE_PROVIDER_SITE_OTHER): Payer: PPO | Admitting: Family Medicine

## 2021-04-08 ENCOUNTER — Encounter: Payer: Self-pay | Admitting: Family Medicine

## 2021-04-08 ENCOUNTER — Other Ambulatory Visit: Payer: Self-pay

## 2021-04-08 VITALS — BP 136/74 | HR 79 | Temp 97.6°F | Ht 61.0 in | Wt 313.5 lb

## 2021-04-08 DIAGNOSIS — R350 Frequency of micturition: Secondary | ICD-10-CM | POA: Diagnosis not present

## 2021-04-08 DIAGNOSIS — N3000 Acute cystitis without hematuria: Secondary | ICD-10-CM | POA: Insufficient documentation

## 2021-04-08 LAB — POC URINALSYSI DIPSTICK (AUTOMATED)
Bilirubin, UA: NEGATIVE
Blood, UA: 25
Glucose, UA: NEGATIVE
Ketones, UA: NEGATIVE
Nitrite, UA: NEGATIVE
Protein, UA: POSITIVE — AB
Spec Grav, UA: >=1.03 — AB (ref 1.010–1.025)
Urobilinogen, UA: 0.2 E.U./dL
pH, UA: 6 (ref 5.0–8.0)

## 2021-04-08 MED ORDER — CEPHALEXIN 500 MG PO CAPS: 500.0000 mg | ORAL_CAPSULE | Freq: Two times a day (BID) | ORAL | 0 refills | Status: DC

## 2021-04-08 NOTE — Progress Notes (Signed)
? ?Subjective:  ? ? Patient ID: Monica Madden, female    DOB: 27-Jun-1960, 61 y.o.   MRN: [id number] ? ?This visit occurred during the SARS-CoV-2 public health emergency.  Safety protocols were in place, including screening questions prior to the visit, additional usage of staff PPE, and extensive cleaning of exam room while observing appropriate contact time as indicated for disinfecting solutions.  ? ?HPI ?Pt presents for urinary symptoms  ? ?Wt Readings from Last 3 Encounters:  ?04/08/21 (!) 313 lb 8 oz (142.2 kg)  ?03/17/21 (!) 321 lb 2 oz (145.7 kg)  ?12/03/20 (!) 315 lb (142.9 kg)  ? ?59.24 kg/m? ? ?Started Friday  ?Urinary pressure /bladder  ?Frequency and urgency  ?Feels need to go and not much volume  ?A little bit of pain with urination  ? ?Ua today  ?Results for orders placed or performed in visit on 04/08/21  ?POCT Urinalysis Dipstick (Automated)  ?Result Value Ref Range  ? Color, UA Dark Yellow   ? Clarity, UA Cloudy   ? Glucose, UA Negative Negative  ? Bilirubin, UA Negative   ? Ketones, UA Negative   ? Spec Grav, UA >=1.030 (A) 1.010 - 1.025  ? Blood, UA 25 Ery/uL   ? pH, UA 6.0 5.0 - 8.0  ? Protein, UA Positive (A) Negative  ? Urobilinogen, UA 0.2 0.2 or 1.0 E.U./dL  ? Nitrite, UA Negative   ? Leukocytes, UA Moderate (2+) (A) Negative  ?  ? ? ?No vaginal discharge or itching  ? ?Lab Results  ?Component Value Date  ? CREATININE 0.73 03/10/2021  ? BUN 15 03/10/2021  ? NA 139 03/10/2021  ? K 4.5 03/10/2021  ? CL 98 03/10/2021  ? CO2 35 (H) 03/10/2021  ? ? ? ? ? ?Review of Systems  ?Constitutional:  Positive for fatigue. Negative for activity change, appetite change and fever.  ?HENT:  Negative for congestion and sore throat.   ?Eyes:  Negative for itching and visual disturbance.  ?Respiratory:  Negative for cough and shortness of breath.   ?Cardiovascular:  Negative for leg swelling.  ?Gastrointestinal:  Negative for abdominal distention, abdominal pain, constipation, diarrhea and nausea.  ?Endocrine:  Negative for cold intolerance and polydipsia.  ?Genitourinary:  Positive for dysuria, frequency and urgency. Negative for difficulty urinating, flank pain and hematuria.  ?Musculoskeletal:  Negative for myalgias.  ?Skin:  Negative for rash.  ?Allergic/Immunologic: Negative for immunocompromised state.  ?Neurological:  Negative for dizziness and weakness.  ?Hematological:  Negative for adenopathy.  ? ?   ?Objective:  ? Physical Exam ?Constitutional:   ?   General: She is not in acute distress. ?   Appearance: Normal appearance. She is well-developed. She is obese. She is not ill-appearing or diaphoretic.  ?HENT:  ?   Head: Normocephalic and atraumatic.  ?Eyes:  ?   Conjunctiva/sclera: Conjunctivae normal.  ?   Pupils: Pupils are equal, round, and reactive to light.  ?Cardiovascular:  ?   Rate and Rhythm: Normal rate and regular rhythm.  ?   Heart sounds: Normal heart sounds.  ?Pulmonary:  ?   Effort: Pulmonary effort is normal.  ?   Breath sounds: Normal breath sounds.  ?Abdominal:  ?   General: Bowel sounds are normal. There is no distension.  ?   Palpations: Abdomen is soft.  ?   Tenderness: There is abdominal tenderness. There is no rebound.  ?   Comments: No cva tenderness ? ?Mild suprapubic tenderness  ?Musculoskeletal:  ?  Cervical back: Normal range of motion and neck supple.  ?Lymphadenopathy:  ?   Cervical: No cervical adenopathy.  ?Skin: ?   Findings: No rash.  ?Neurological:  ?   Mental Status: She is alert.  ? ? ? ? ? ?   ?Assessment & Plan:  ? ?Problem List Items Addressed This Visit   ? ?  ? Genitourinary  ? Acute cystitis - Primary  ?  Uncomplicated, positive ua and voiding symptoms ?tx with keflex ?Culture pending  ?Encouraged water intake  ?Handout given  ?inst to call if suddenly worse/ER precautions rev ?  ?  ? Relevant Orders  ? Urine Culture  ?  ? Other  ? FREQUENCY, URINARY  ? Relevant Orders  ? POCT Urinalysis Dipstick (Automated) (Completed)  ? ? ?

## 2021-04-08 NOTE — Assessment & Plan Note (Signed)
Uncomplicated, positive ua and voiding symptoms ?tx with keflex ?Culture pending  ?Encouraged water intake  ?Handout given  ?inst to call if suddenly worse/ER precautions rev ?

## 2021-04-08 NOTE — Patient Instructions (Signed)
Drink lots of water  ? ?Take the keflex as directed  ?Eat your yogurt  ? ?We will contact you with a culture result  ? ?If symptoms suddenly worsen please alert [place]  ? ? ? ?

## 2021-04-10 LAB — URINE CULTURE
MICRO NUMBER:: 13164915
SPECIMEN QUALITY:: ADEQUATE

## 2021-04-24 ENCOUNTER — Other Ambulatory Visit: Payer: Self-pay | Admitting: Family Medicine

## 2021-04-27 ENCOUNTER — Encounter: Payer: Self-pay | Admitting: Family Medicine

## 2021-04-27 NOTE — Telephone Encounter (Signed)
On 04/25/21 pt started again with frequency of urine and pressure feeling in perineal area. No fever, burning or pain upon urination and no lower abd pain. Pt said no new back pain. Pt finished abx pt was symptom free previously. Pt request note sent to Dr Milinda Antis to see if needs appt or what Dr Milinda Antis would say do. [facility] [address] Pt request cb after reviewed by Dr Milinda Antis and pt said in no distress and can wait for cb tomorrow. UC & ED precautions and pt voiced understanding. Sending note to Dr Milinda Antis and [place] CMA. ?

## 2021-04-28 ENCOUNTER — Telehealth: Payer: Self-pay

## 2021-04-28 ENCOUNTER — Other Ambulatory Visit (INDEPENDENT_AMBULATORY_CARE_PROVIDER_SITE_OTHER): Payer: PPO

## 2021-04-28 ENCOUNTER — Telehealth: Payer: Self-pay | Admitting: *Deleted

## 2021-04-28 DIAGNOSIS — R8290 Unspecified abnormal findings in urine: Secondary | ICD-10-CM | POA: Diagnosis not present

## 2021-04-28 DIAGNOSIS — R350 Frequency of micturition: Secondary | ICD-10-CM

## 2021-04-28 LAB — POC URINALSYSI DIPSTICK (AUTOMATED)
Blood, UA: 50
Glucose, UA: NEGATIVE
Protein, UA: POSITIVE — AB
Spec Grav, UA: 1.02 (ref 1.010–1.025)
pH, UA: 6 (ref 5.0–8.0)

## 2021-04-28 MED ORDER — CEPHALEXIN 500 MG PO CAPS: 500.0000 mg | ORAL_CAPSULE | Freq: Two times a day (BID) | ORAL | 0 refills | Status: DC

## 2021-04-28 NOTE — Telephone Encounter (Signed)
Pt notified of UA results and Dr. Royden Purl comments Rx sent to pharmacy  ?

## 2021-04-28 NOTE — Telephone Encounter (Signed)
-----   Message from Judy Pimple, MD sent at 04/28/2021  4:32 PM EDT ----- ?Please send in keflex 500 mg bid for 7 d #14 no ref ?

## 2021-04-28 NOTE — Telephone Encounter (Signed)
Per Dr. Milinda Antis, pt will have family drop off urine sample  ?

## 2021-04-28 NOTE — Telephone Encounter (Signed)
Addressed with pt and Rx sent to pharmacy  ?

## 2021-04-28 NOTE — Telephone Encounter (Signed)
Ua looks positive ?See result note-will tx with keflex waiting for urine culture  ?

## 2021-04-28 NOTE — Telephone Encounter (Signed)
Patient called states that she seen results in my chart that you were going to send in abx today. I have reviewed chart and did not see any information.  ?

## 2021-04-30 LAB — URINE CULTURE
MICRO NUMBER:: 13248015
SPECIMEN QUALITY:: ADEQUATE

## 2021-05-08 ENCOUNTER — Ambulatory Visit (INDEPENDENT_AMBULATORY_CARE_PROVIDER_SITE_OTHER): Payer: PPO | Admitting: Family Medicine

## 2021-05-08 ENCOUNTER — Encounter: Payer: Self-pay | Admitting: Family Medicine

## 2021-05-08 DIAGNOSIS — G8929 Other chronic pain: Secondary | ICD-10-CM

## 2021-05-08 DIAGNOSIS — M25562 Pain in left knee: Secondary | ICD-10-CM | POA: Diagnosis not present

## 2021-05-08 DIAGNOSIS — N3000 Acute cystitis without hematuria: Secondary | ICD-10-CM | POA: Diagnosis not present

## 2021-05-08 DIAGNOSIS — M25561 Pain in right knee: Secondary | ICD-10-CM | POA: Diagnosis not present

## 2021-05-08 MED ORDER — HYDROCODONE-ACETAMINOPHEN 5-325 MG PO TABS: 1.0000 | ORAL_TABLET | Freq: Four times a day (QID) | ORAL | 0 refills | Status: DC | PRN

## 2021-05-08 NOTE — Assessment & Plan Note (Signed)
No changes in pain from severe OA of knees and shoulders  ?Had injections in march/ortho f/u  ?Will need UDS in may/planning that  ?Px norco post dated to fill after 4/26, 5/26 and 6/26 ?No red flags with [facility] ?Contracts is up to date  ?

## 2021-05-08 NOTE — Progress Notes (Signed)
Virtual Visit via Telephone Note ? ?I connected with Monica MungoJoanne C Madden on 05/08/21 at 11:00 AM EDT by telephone and verified that I am speaking with the correct person using two identifiers. ? ?Location: ?Patient: home ?Provider: office ?  ?I discussed the limitations, risks, security and privacy concerns of performing an evaluation and management service by telephone and the availability of in person appointments. I also discussed with the patient that there may be a patient responsible charge related to this service. The patient expressed understanding and agreed to proceed. ? ?Parties involved in encounter ? ?Patient: Monica Madden ? ?Provider:  Roxy MannsMarne Rami Waddle MD  ? ?History of Present Illness: ?Wt Readings from Last 3 Encounters:  ?04/08/21 (!) 313 lb 8 oz (142.2 kg)  ?03/17/21 (!) 321 lb 2 oz (145.7 kg)  ?12/03/20 (!) 315 lb (142.9 kg)  ? ?Here for enc for chronic pain management  ? ?Indication for chronic opioid: OA pain knees and shoulders ?Had L knee and R shoulder inj with ortho in early march ?Has f/u planned for R knee soon ?As active as she can be  ?Pain med really helps  ? ?Medication and dose: norco 5-325 mg up to every 6 h ? ?# pills per month: 90 ? ?Last UDS date: 06/08/20 ?Opioid Treatment Agreement signed (Y/N): yes ? ?Opioid Treatment Agreement last reviewed with patient: 06/08/20 ? ? ?NCCSRS reviewed this encounter (include red flags): yes,today  ?No red flags  ? ?Uti is clinically improving on keflex  ?Drinking water and cranberry  ?Has not tried cranberry tabs  ?Has gyn appt upcoming  ? ?E coli on cx - resistant to quinolones  ? ?Patient Active Problem List  ? Diagnosis Date Noted  ? Acute cystitis 04/08/2021  ? Colon cancer screening 03/17/2021  ? Paresthesia of both hands 08/06/2020  ? Medicare annual wellness visit, subsequent 12/01/2018  ? Encounter for chronic pain management 02/07/2017  ? Chronic pain 11/17/2015  ? Elevated glucose level 11/09/2015  ? Routine general medical examination at a  health care facility 06/30/2011  ? Amenorrhea 06/30/2011  ? DYSPEPSIA 09/26/2009  ? IRRITABLE BOWEL SYNDROME 08/07/2009  ? KNEE PAIN, BILATERAL 08/07/2009  ? CONSTIPATION 01/15/2009  ? LEG PAIN, LEFT 10/09/2008  ? Hyperlipidemia 05/09/2008  ? Essential hypertension 03/29/2008  ? 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 06/05/2007  ? INCI HERNIA WITHOUT MENTION OBSTRUCTION/GANGRENE 05/12/2007  ? FREQUENCY, URINARY 05/12/2007  ? ARTHRALGIA 11/03/2006  ? DEPENDENT EDEMA, LEGS 11/03/2006  ? Morbid obesity (HCC) 07/15/2006  ? ANXIETY DEPRESSION 07/15/2006  ? MIGRAINE HEADACHE 07/15/2006  ? RETINITIS PIGMENTOSA 07/15/2006  ? VARICOSE VEINS, LOWER EXTREMITIES 07/15/2006  ? ALLERGIC RHINITIS 07/15/2006  ? ASTHMA 07/15/2006  ? GERD 07/15/2006  ? BACK PAIN, LUMBAR, CHRONIC 07/15/2006  ? INCONTINENCE, URGE 07/15/2006  ? ?Past Medical History:  ?Diagnosis Date  ? Allergy   ? allergic rhinitis  ? Anemia   ? Anxiety   ? ATTACKS  ? Arthritis   ? Asthma   ? Cataract   ? left cataract removal 12/2008  ? Chronic back pain   ? OA of spine  ? Depression   ? GERD (gastroesophageal reflux disease)   ? Endo negative 02/2000  ? Hypertension 03/2008  ? IBS (irritable bowel syndrome)   ? MRSA infection 2008  ? Right leg  ? Obesity   ? Ovarian cyst   ? Retinitis pigmentosa   ? blind  ? ?Past Surgical History:  ?Procedure Laterality Date  ? APPENDECTOMY    ?  BLADDER SURGERY  1968  ? age 31 ? dilitation  ? CATARACT EXTRACTION W/PHACO Right 11/11/2014  ? Procedure: CATARACT EXTRACTION PHACO AND INTRAOCULAR LENS PLACEMENT (IOC);  Surgeon: Sallee Lange, MD;  Location: [facility] ORS;  Service: Ophthalmology;  Laterality: Right;  LOT PACK: [id number] ?[place]: 00:29.7 ?AP: 11.2 ?CDE: 7.14  ? CESAREAN SECTION    ? CHOLECYSTECTOMY    ? CYSTOSCOPY  03/2008  ? negative// Dr. Wanda Plump urologist  ? ENDOMETRIAL BIOPSY  07/1998  ? negative  ? EYE SURGERY  12/2008  ? cataract removal left  ? HERNIA REPAIR  05/2006  ? ruptured hernia repair after fall and  then two more surgeries within as many weeks for complications(05/2006) Umbilical hernia repair (06/1997)  ? KNEE ARTHROSCOPY W/ MENISCAL REPAIR    ? left knne  ? ?Social History  ? ?Tobacco Use  ? Smoking status: Never  ? Smokeless tobacco: Never  ?Substance Use Topics  ? Alcohol use: Yes  ?  Comment: twice a year  ? Drug use: Yes  ?  Types: Marijuana  ? ?Family History  ?Problem Relation Age of Onset  ? Depression Mother   ? Heart disease Father   ? Diabetes Maternal Uncle   ? Hypertension Maternal Grandmother   ? Heart disease Paternal Grandmother   ? Heart disease Paternal Grandfather   ? Depression Brother   ?     Depression secondary to MVA injuries  ? Hypertension Brother   ? Breast cancer Neg Hx   ? ?Allergies  ?Allergen Reactions  ? Diclofenac Sodium   ?  REACTION: allergic/ made asthma bad  ? Etodolac   ?  REACTION: GI  ? Metronidazole   ?  REACTION: GI side eff  ? Nsaids Other (See Comments)  ?  GI Upset  ? Oxybutynin Itching  ? Rosuvastatin   ?  Myalgias ?  ? Sulfamethoxazole-Trimethoprim   ? Tolterodine Tartrate   ?  REACTION: None Effective  ? Tramadol Other (See Comments)  ?  Caused elevated BP  ? Sulfa Antibiotics Itching and Rash  ? ?Current Outpatient Medications on File Prior to Visit  ?Medication Sig Dispense Refill  ? Albuterol Sulfate (PROAIR HFA IN) Inhale 2 puffs into the lungs every 6 (six) hours as needed.    ? ALLERGY RELIEF 180 MG tablet TAKE 1 TABLET BY MOUTH EVERY DAY 90 tablet 1  ? ALPRAZolam (XANAX) 1 MG tablet TAKE 1 TABLET(1 MG) BY MOUTH THREE TIMES DAILY AS NEEDED 90 tablet 3  ? Ascorbic Acid (VITAMIN C) 1000 MG tablet Take 1,000 mg by mouth daily.    ? Black Elderberry,Berry-Flower, 575 MG CAPS Take 1 capsule by mouth daily.    ? Calcium Carbonate-Vitamin D 600-400 MG-UNIT tablet Take 1 tablet by mouth daily.    ? Cholecalciferol (VITAMIN D3 PO) Take 1,000 Units by mouth daily.    ? Coenzyme Q10 (CO Q 10 PO) Take 1 tablet by mouth daily.    ? cyclobenzaprine (FLEXERIL) 10 MG  tablet Take 10 mg by mouth 3 (three) times daily as needed for muscle spasms.    ? fluticasone (FLONASE) 50 MCG/ACT nasal spray SHAKE LIQUID AND USE 2 SPRAYS IN EACH NOSTRIL TWICE DAILY 48 g 1  ? lisinopril (ZESTRIL) 5 MG tablet TAKE 1 TABLET(5 MG) BY MOUTH EVERY MORNING 90 tablet 1  ? montelukast (SINGULAIR) 10 MG tablet TAKE 1 TABLET BY MOUTH EVERY DAY 90 tablet 1  ? Omega 3-6-9 Fatty Acids (OMEGA 3-6-9 COMPLEX PO) Take one tablet by  mouth daily    ? omeprazole (PRILOSEC) 40 MG capsule TAKE 1 CAPSULE BY MOUTH EVERY DAY 90 capsule 1  ? OVER THE COUNTER MEDICATION daily as needed. HYLAND'S CELL SALTS #8 MAGNESIA PHOSPHORICA    ? sertraline (ZOLOFT) 50 MG tablet TAKE 1 TABLET(50 MG) BY MOUTH DAILY 90 tablet 1  ? tolterodine (DETROL LA) 4 MG 24 hr capsule TAKE 1 CAPSULE(4 MG) BY MOUTH DAILY AS NEEDED 90 capsule 0  ? ?No current facility-administered medications on file prior to visit.  ? Review of Systems  ?Constitutional:  Negative for chills, fever and malaise/fatigue.  ?HENT:  Negative for congestion, ear pain, sinus pain and sore throat.   ?Eyes:  Negative for blurred vision, discharge and redness.  ?Respiratory:  Negative for cough, shortness of breath and stridor.   ?Cardiovascular:  Negative for chest pain, palpitations and leg swelling.  ?Gastrointestinal:  Negative for abdominal pain, diarrhea, nausea and vomiting.  ?Musculoskeletal:  Positive for joint pain. Negative for myalgias.  ?Skin:  Negative for rash.  ?Neurological:  Negative for dizziness and headaches.   ?  ?Observations/Objective: ?Pt sounds well  ?Like her usual self  ?Not distressed  ?Nl voice ?Nl affect  ?Good historian /nl cognition  ? ?Assessment and Plan: ?Problem List Items Addressed This Visit   ? ?  ? Genitourinary  ? Acute cystitis  ?  Clinically improved with keflex  ?E coli resistant to quinolones ? ?Will update if symptoms return  ?Enc water intake  ?Plans to try cranberry pills ? ?  ?  ?  ? Other  ? Encounter for chronic pain  management - Primary  ?  No changes in pain from severe OA of knees and shoulders  ?Had injections in march/ortho f/u  ?Will need UDS in may/planning that  ?Px norco post dated to fill after 4/26, 5/26 and 6/26 ?No re

## 2021-05-08 NOTE — Assessment & Plan Note (Signed)
Clinically improved with keflex  ?E coli resistant to quinolones ? ?Will update if symptoms return  ?Enc water intake  ?Plans to try cranberry pills ?

## 2021-05-08 NOTE — Patient Instructions (Addendum)
Let's set up your urine drug screen for May 4 ? ?No change in medicines ?Drink water  ?Try cranberry pills to prevent uti  ?Continue foods with active cultures  ?

## 2021-05-08 NOTE — Progress Notes (Deleted)
? ?  Subjective:  ? ? Patient ID: Monica Madden, female    DOB: Nov 15, 1960, 61 y.o.   MRN: [id number] ? ?HPI ?Pt presents for 3 month encounter for chronic pain medication  ? ?Wt Readings from Last 3 Encounters:  ?04/08/21 (!) 313 lb 8 oz (142.2 kg)  ?03/17/21 (!) 321 lb 2 oz (145.7 kg)  ?12/03/20 (!) 315 lb (142.9 kg)  ? ? ?Indication for chronic opioid: chronic OA pain in knees and shoulders  ? ?More pain in joints today  ?There is a front coming in /weather change (knees and shoulders and back hurt) ?Otherwise about the same ? ?Last ortho appt was 3/6 ?Did L knee and R shoulder injection  ?It usually help  ? ?Plans the R knee inj the next visit  ? ? ?Medication and dose: nocro 5-325 mg up to every 6 h ?# pills per month: 90 ? ?Last UDS date: 05/25/2020 ? ?Opioid Treatment Agreement signed (Y/N): yes ? ?Opioid Treatment Agreement last reviewed with patient:  05/25/2020 ? ?NCCSRS reviewed this encounter (include red flags): yes, today and no red flags  ?Last refill 04/13/21 ?Aware she also takes alprazolam ? ?Recently took keflex for another uti  ?Last dose was Tuesday - still resolved  ?Drinking more fluid and some cranberry  (interested in tabs)  ?Also yogurt / active cultures  ? ?Azo helped when she was symptomatic -2 days  ? ?Has appt for gyn coming up at [facility]  ? ? ?She drinks tea with lemon  ? ? ? ?Review of Systems ? ?   ?Objective:  ? Physical Exam ? ? ? ? ?   ?Assessment & Plan:  ? ? ?

## 2021-05-08 NOTE — Assessment & Plan Note (Signed)
Had L knee inj early march ?Planning a R injection next time  ?

## 2021-05-13 DIAGNOSIS — Z961 Presence of intraocular lens: Secondary | ICD-10-CM | POA: Diagnosis not present

## 2021-05-13 DIAGNOSIS — H3552 Pigmentary retinal dystrophy: Secondary | ICD-10-CM | POA: Diagnosis not present

## 2021-05-21 ENCOUNTER — Other Ambulatory Visit: Payer: PPO

## 2021-05-21 ENCOUNTER — Telehealth: Payer: Self-pay

## 2021-05-21 ENCOUNTER — Encounter: Payer: Self-pay | Admitting: Advanced Practice Midwife

## 2021-05-21 ENCOUNTER — Other Ambulatory Visit (HOSPITAL_COMMUNITY)
Admission: RE | Admit: 2021-05-21 | Discharge: 2021-05-21 | Disposition: A | Discharge status: Home / Self Care | Payer: PPO | Source: Ambulatory Visit | Attending: Advanced Practice Midwife | Admitting: Advanced Practice Midwife

## 2021-05-21 ENCOUNTER — Ambulatory Visit (INDEPENDENT_AMBULATORY_CARE_PROVIDER_SITE_OTHER): Payer: PPO | Admitting: Advanced Practice Midwife

## 2021-05-21 VITALS — BP 139/83 | HR 83 | Wt 314.0 lb

## 2021-05-21 DIAGNOSIS — Z87440 Personal history of urinary (tract) infections: Secondary | ICD-10-CM

## 2021-05-21 DIAGNOSIS — Z01419 Encounter for gynecological examination (general) (routine) without abnormal findings: Secondary | ICD-10-CM | POA: Insufficient documentation

## 2021-05-21 DIAGNOSIS — H548 Legal blindness, as defined in USA: Secondary | ICD-10-CM

## 2021-05-21 DIAGNOSIS — Z7409 Other reduced mobility: Secondary | ICD-10-CM

## 2021-05-21 DIAGNOSIS — G8929 Other chronic pain: Secondary | ICD-10-CM

## 2021-05-21 DIAGNOSIS — Z1151 Encounter for screening for human papillomavirus (HPV): Secondary | ICD-10-CM | POA: Diagnosis not present

## 2021-05-21 NOTE — Progress Notes (Signed)
Patient here for Annual Exam.  ? ?LMP: Ablation  ? ?No longer having UTI sx's pt completed antibiotics from PCP.  ? ?Last Mammogram: 12/15/2020 WNL  ?Family Hx of Breast Cancer: None ? ? ?CC: None ? ? ? ? ? ?

## 2021-05-21 NOTE — Telephone Encounter (Signed)
Called pt to notify her of check out instructions to get scheduled for a mammo. Left scheduling office # for pt to call and schedule at her convenience due to transportation. She also mentioned PCP taking care of her annual needs.  ?

## 2021-05-22 LAB — CYTOLOGY - PAP
Comment: NEGATIVE
Diagnosis: NEGATIVE
High risk HPV: NEGATIVE

## 2021-05-22 NOTE — Progress Notes (Signed)
? ? ?GYNECOLOGY ANNUAL PREVENTATIVE CARE ENCOUNTER NOTE ? ?History:    ? Monica Madden is a 61 y.o. G1P0 female here for a routine annual gynecologic exam.  Current complaints: none.   Denies abnormal vaginal bleeding, discharge, pelvic pain, problems with intercourse or other gynecologic concerns.  [name] lives with her husband and 85 year old daughter.  ?  ?Gynecologic History ?No LMP recorded. Patient has had an ablation. ?Contraception: tubal ligation ?Last Pap: 08/2014. Result was normal with negative HPV ?Last Mammogram: 11/2020.  Result was normal ?Last Colonoscopy: Cologuard 03/29/2021.  Result was normal ? ?Obstetric History ?OB History  ?Gravida Para Term Preterm AB Living  ?1         1  ?SAB IAB Ectopic Multiple Live Births  ?           ?  ?# Outcome Date GA Lbr Len/2nd Weight Sex Delivery Anes PTL Lv  ?1 Gravida           ? ? ?Past Medical History:  ?Diagnosis Date  ? Allergy   ? allergic rhinitis  ? Anemia   ? Anxiety   ? ATTACKS  ? Arthritis   ? Asthma   ? Cataract   ? left cataract removal 12/2008  ? Chronic back pain   ? OA of spine  ? Depression   ? GERD (gastroesophageal reflux disease)   ? Endo negative 02/2000  ? Hypertension 03/2008  ? IBS (irritable bowel syndrome)   ? MRSA infection 2008  ? Right leg  ? Obesity   ? Ovarian cyst   ? Retinitis pigmentosa   ? blind  ? ? ?Past Surgical History:  ?Procedure Laterality Date  ? APPENDECTOMY    ? BLADDER SURGERY  1968  ? age 78 ? dilitation  ? CATARACT EXTRACTION W/PHACO Right 11/11/2014  ? Procedure: CATARACT EXTRACTION PHACO AND INTRAOCULAR LENS PLACEMENT (IOC);  Surgeon: Estill Cotta, MD;  Location: [facility] ORS;  Service: Ophthalmology;  Laterality: Right;  LOT PACKDO:[id number] ?[place]: 00:29.7 ?AP: 11.2 ?CDE: 7.14  ? CESAREAN SECTION    ? CHOLECYSTECTOMY    ? CYSTOSCOPY  03/2008  ? negative// Dr. Reece Agar urologist  ? ENDOMETRIAL BIOPSY  07/1998  ? negative  ? EYE SURGERY  12/2008  ? cataract removal left  ? HERNIA REPAIR  05/2006  ? ruptured  hernia repair after fall and then two more surgeries within as many weeks for [id number]) Umbilical hernia repair (06/1997)  ? KNEE ARTHROSCOPY W/ MENISCAL REPAIR    ? left knne  ? ? ?Current Outpatient Medications on File Prior to Visit  ?Medication Sig Dispense Refill  ? Albuterol Sulfate (PROAIR HFA IN) Inhale 2 puffs into the lungs every 6 (six) hours as needed.    ? ALLERGY RELIEF 180 MG tablet TAKE 1 TABLET BY MOUTH EVERY DAY 90 tablet 1  ? ALPRAZolam (XANAX) 1 MG tablet TAKE 1 TABLET(1 MG) BY MOUTH THREE TIMES DAILY AS NEEDED 90 tablet 3  ? Ascorbic Acid (VITAMIN C) 1000 MG tablet Take 1,000 mg by mouth daily.    ? Black Elderberry,Berry-Flower, 575 MG CAPS Take 1 capsule by mouth daily.    ? Calcium Carbonate-Vitamin D 600-400 MG-UNIT tablet Take 1 tablet by mouth daily.    ? Cholecalciferol (VITAMIN D3 PO) Take 1,000 Units by mouth daily.    ? Coenzyme Q10 (CO Q 10 PO) Take 1 tablet by mouth daily.    ? cyclobenzaprine (FLEXERIL) 10 MG tablet Take 10 mg by mouth 3 (  three) times daily as needed for muscle spasms.    ? fluticasone (FLONASE) 50 MCG/ACT nasal spray SHAKE LIQUID AND USE 2 SPRAYS IN EACH NOSTRIL TWICE DAILY 48 g 1  ? HYDROcodone-acetaminophen (NORCO) 5-325 MG tablet Take 1 tablet by mouth every 6 (six) hours as needed for moderate pain or severe pain. Do not fill before 07/13/21 90 tablet 0  ? HYDROcodone-acetaminophen (NORCO) 5-325 MG tablet Take 1 tablet by mouth every 6 (six) hours as needed for moderate pain or severe pain. Do not fill before  06/12/21 90 tablet 0  ? HYDROcodone-acetaminophen (NORCO/VICODIN) 5-325 MG tablet Take 1 tablet by mouth every 6 (six) hours as needed for moderate pain. Do not fill before 05/13/21 90 tablet 0  ? lisinopril (ZESTRIL) 5 MG tablet TAKE 1 TABLET(5 MG) BY MOUTH EVERY MORNING 90 tablet 1  ? montelukast (SINGULAIR) 10 MG tablet TAKE 1 TABLET BY MOUTH EVERY DAY 90 tablet 1  ? Omega 3-6-9 Fatty Acids (OMEGA 3-6-9 COMPLEX PO) Take one tablet by mouth  daily    ? omeprazole (PRILOSEC) 40 MG capsule TAKE 1 CAPSULE BY MOUTH EVERY DAY 90 capsule 1  ? OVER THE COUNTER MEDICATION daily as needed. HYLAND'S CELL SALTS #8 MAGNESIA PHOSPHORICA    ? sertraline (ZOLOFT) 50 MG tablet TAKE 1 TABLET(50 MG) BY MOUTH DAILY 90 tablet 1  ? tolterodine (DETROL LA) 4 MG 24 hr capsule TAKE 1 CAPSULE(4 MG) BY MOUTH DAILY AS NEEDED 90 capsule 0  ? ?No current facility-administered medications on file prior to visit.  ? ? ?Allergies  ?Allergen Reactions  ? Diclofenac Sodium   ?  REACTION: allergic/ made asthma bad  ? Etodolac   ?  REACTION: GI  ? Metronidazole   ?  REACTION: GI side eff  ? Nsaids Other (See Comments)  ?  GI Upset  ? Oxybutynin Itching  ? Rosuvastatin   ?  Myalgias ?  ? Sulfamethoxazole-Trimethoprim   ? Tolterodine Tartrate   ?  REACTION: None Effective  ? Tramadol Other (See Comments)  ?  Caused elevated BP  ? Sulfa Antibiotics Itching and Rash  ? ? ?Social History:  reports that she has never smoked. She has never used smokeless tobacco. She reports current alcohol use. She reports current drug use. Drug: Marijuana. ? ?Family History  ?Problem Relation Age of Onset  ? Depression Mother   ? Heart disease Father   ? Diabetes Maternal Uncle   ? Hypertension Maternal Grandmother   ? Heart disease Paternal Grandmother   ? Heart disease Paternal Grandfather   ? Depression Brother   ?     Depression secondary to MVA injuries  ? Hypertension Brother   ? Breast cancer Neg Hx   ? ? ?The following portions of the patient's history were reviewed and updated as appropriate: allergies, current medications, past family history, past medical history, past social history, past surgical history and problem list. ? ?Review of Systems ?Pertinent items noted in HPI and remainder of comprehensive ROS otherwise negative. ? ?Physical Exam:  ?BP 139/83   Pulse 83   Wt (!) 314 lb (142.4 kg)   BMI 59.33 kg/m?  ?CONSTITUTIONAL: Well-developed, well-nourished female in no acute distress.  ?HENT:   Normocephalic, atraumatic, External right and left ear normal.  ?EYES: Conjunctivae and EOM are normal. Pupils are equal, round, and reactive to light. No scleral icterus.  ?NECK: Normal range of motion, supple, no masses.   ?SKIN: Skin is warm and dry. No rash noted. Not diaphoretic.  No erythema. No pallor. ?MUSCULOSKELETAL: Normal range of motion. No tenderness.  No cyanosis, clubbing, or edema. ?NEUROLOGIC: Alert and oriented to person, place, and time. Normal reflexes, muscle tone coordination.  ?PSYCHIATRIC: Normal mood and affect. Normal behavior. Normal judgment and thought content. ?CARDIOVASCULAR: Normal heart rate noted, regular rhythm ?RESPIRATORY: Clear to auscultation bilaterally. Effort and breath sounds normal, no problems with respiration noted. ?BREASTS: Symmetric in size. No masses, tenderness, skin changes, nipple drainage, or lymphadenopathy bilaterally. Performed in the presence of a chaperone. ?ABDOMEN: Soft, no distention noted.  No tenderness, rebound or guarding.  ?PELVIC: Normal appearing external genitalia and urethral meatus; normal appearing vaginal mucosa and cervix.  No abnormal vaginal discharge noted.  Pap smear obtained.  Performed in the presence of a chaperone. ?  ?Assessment and Plan:  ?  1. Well woman exam with routine gynecological exam ?- No abnormal findings on physical exam ?- MM 3D SCREEN BREAST BILATERAL; Future ?- Cytology - PAP ? ?2. Hx: UTI (urinary tract infection) ?- Completed course, now asymptomatic ? ?3. Legally blind ? ? ?4. Mobility impaired ?- Wearing brace on left knee, able to maintain about 80 degrees of flexion during pap ? ? ?Will follow up results of pap smear and manage accordingly. ?Mammogram not scheduled as patient has specific schedule with her aunt in November 2023 ?Colon cancer screening is up to date ? ?Mallie Snooks, MSA, MSN, CNM ?Certified Nurse Midwife, [place] ?[facility] ? ?

## 2021-05-24 LAB — DRUG MONITORING, PANEL 8 WITH CONFIRMATION, URINE
6 Acetylmorphine: NEGATIVE ng/mL (ref <10)
Alcohol Metabolites: NEGATIVE ng/mL (ref <500)
Alphahydroxyalprazolam: 591 ng/mL — ABNORMAL HIGH (ref <25)
Alphahydroxymidazolam: NEGATIVE ng/mL (ref <50)
Alphahydroxytriazolam: NEGATIVE ng/mL (ref <50)
Aminoclonazepam: NEGATIVE ng/mL (ref <25)
Amphetamines: NEGATIVE ng/mL (ref <500)
Benzodiazepines: POSITIVE ng/mL — AB (ref <100)
Buprenorphine, Urine: NEGATIVE ng/mL (ref <5)
Cocaine Metabolite: NEGATIVE ng/mL (ref <150)
Codeine: NEGATIVE ng/mL (ref <50)
Creatinine: 159.6 mg/dL (ref >=20.0)
Hydrocodone: 1358 ng/mL — ABNORMAL HIGH (ref <50)
Hydromorphone: 249 ng/mL — ABNORMAL HIGH (ref <50)
Hydroxyethylflurazepam: NEGATIVE ng/mL (ref <50)
Lorazepam: NEGATIVE ng/mL (ref <50)
MDMA: NEGATIVE ng/mL (ref <500)
Marijuana Metabolite: NEGATIVE ng/mL (ref <20)
Morphine: NEGATIVE ng/mL (ref <50)
Nordiazepam: NEGATIVE ng/mL (ref <50)
Norhydrocodone: 1487 ng/mL — ABNORMAL HIGH (ref <50)
Opiates: POSITIVE ng/mL — AB (ref <100)
Oxazepam: NEGATIVE ng/mL (ref <50)
Oxidant: NEGATIVE ug/mL (ref <200)
Oxycodone: NEGATIVE ng/mL (ref <100)
Temazepam: NEGATIVE ng/mL (ref <50)
pH: 6.4 (ref 4.5–9.0)

## 2021-05-24 LAB — DM TEMPLATE

## 2021-06-11 ENCOUNTER — Encounter: Payer: Self-pay | Admitting: Family Medicine

## 2021-06-11 MED ORDER — CYCLOBENZAPRINE HCL 10 MG PO TABS: 10.0000 mg | ORAL_TABLET | Freq: Three times a day (TID) | ORAL | 3 refills | Status: DC | PRN

## 2021-06-11 MED ORDER — ALPRAZOLAM 1 MG PO TABS: ORAL_TABLET | ORAL | 3 refills | Status: DC

## 2021-06-24 DIAGNOSIS — M7581 Other shoulder lesions, right shoulder: Secondary | ICD-10-CM | POA: Diagnosis not present

## 2021-06-24 DIAGNOSIS — M19011 Primary osteoarthritis, right shoulder: Secondary | ICD-10-CM | POA: Diagnosis not present

## 2021-06-24 DIAGNOSIS — M1712 Unilateral primary osteoarthritis, left knee: Secondary | ICD-10-CM | POA: Diagnosis not present

## 2021-07-08 ENCOUNTER — Other Ambulatory Visit: Payer: PPO

## 2021-07-18 ENCOUNTER — Other Ambulatory Visit: Payer: Self-pay | Admitting: Family Medicine

## 2021-07-22 ENCOUNTER — Other Ambulatory Visit: Payer: Self-pay | Admitting: Family Medicine

## 2021-08-10 ENCOUNTER — Telehealth (INDEPENDENT_AMBULATORY_CARE_PROVIDER_SITE_OTHER): Payer: PPO | Admitting: Family Medicine

## 2021-08-10 ENCOUNTER — Encounter: Payer: Self-pay | Admitting: Family Medicine

## 2021-08-10 VITALS — Wt 311.0 lb

## 2021-08-10 DIAGNOSIS — G8929 Other chronic pain: Secondary | ICD-10-CM | POA: Diagnosis not present

## 2021-08-10 MED ORDER — HYDROCODONE-ACETAMINOPHEN 5-325 MG PO TABS: 1.0000 | ORAL_TABLET | Freq: Four times a day (QID) | ORAL | 0 refills | Status: DC | PRN

## 2021-08-10 MED ORDER — ALPRAZOLAM 1 MG PO TABS
ORAL_TABLET | ORAL | 3 refills | Status: DC
Start: 2021-08-10 — End: 2021-12-15

## 2021-08-10 NOTE — Assessment & Plan Note (Signed)
Primarily knees/shoulders Rev last ortho appt note  Injections continue to help for short term  Pt is not interested in joint repl yet

## 2021-08-10 NOTE — Progress Notes (Signed)
Virtual Visit via Telephone Note  I connected with Monica Madden on 08/10/21 at 11:30 AM EDT by telephone and verified that I am speaking with the correct person using two identifiers.  Location: Patient: home Provider: office    I discussed the limitations, risks, security and privacy concerns of performing an evaluation and management service by telephone and the availability of in person appointments. I also discussed with the patient that there may be a patient responsible charge related to this service. The patient expressed understanding and agreed to proceed.  Parties involved in encounter  Patient: Monica Madden  Provider:  Roxy Manns MD   History of Present Illness: Pt presents for encounter for chronic pain management   Doing ok  Went swimming a few times-this is good for her joints  Wishes she could have a pool -loves it  Family visiting also Was able to get a massage and it was wonderful - it really helps pain  Asked for gift cert for holidays etc   Indication for chronic opioid: OA pain in knees and shoulder Sees ortho and has injections (feels better after her injections)    Last visit to ortho was 6/7 Inj knee and shoulder  Wears knee brace most of the time  Joint replacement is something she needs/not ready to get  Wants to wait as long as possible    Medication and dose: norco 5-325 mg Q 6 h prn No side effects or problems   # pills per month: 90  Last UDS date: 05/21/2021  Low risk study as expected    Opioid Treatment Agreement signed (Y/N): yes   Opioid Treatment Agreement last reviewed with patient:  05/21/2021    [facility] reviewed this encounter (include red flags):  reviewed today  No red flags Last px filled 07/13/21  She also takes alprazolam and this is followed   Patient Active Problem List   Diagnosis Date Noted   Acute cystitis 04/08/2021   Colon cancer screening 03/17/2021   Paresthesia of both hands 08/06/2020   Medicare annual  wellness visit, subsequent 12/01/2018   Encounter for chronic pain management 02/07/2017   Chronic pain 11/17/2015   Elevated glucose level 11/09/2015   Routine general medical examination at a health care facility 06/30/2011   Amenorrhea 06/30/2011   DYSPEPSIA 09/26/2009   IRRITABLE BOWEL SYNDROME 08/07/2009   KNEE PAIN, BILATERAL 08/07/2009   CONSTIPATION 01/15/2009   LEG PAIN, LEFT 10/09/2008   Hyperlipidemia 05/09/2008   Essential hypertension 03/29/2008   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 06/05/2007   INCI HERNIA WITHOUT MENTION OBSTRUCTION/GANGRENE 05/12/2007   FREQUENCY, URINARY 05/12/2007   ARTHRALGIA 11/03/2006   DEPENDENT EDEMA, LEGS 11/03/2006   Morbid obesity (HCC) 07/15/2006   ANXIETY DEPRESSION 07/15/2006   MIGRAINE HEADACHE 07/15/2006   RETINITIS PIGMENTOSA 07/15/2006   VARICOSE VEINS, LOWER EXTREMITIES 07/15/2006   ALLERGIC RHINITIS 07/15/2006   ASTHMA 07/15/2006   GERD 07/15/2006   BACK PAIN, LUMBAR, CHRONIC 07/15/2006   INCONTINENCE, URGE 07/15/2006   Past Medical History:  Diagnosis Date   Allergy    allergic rhinitis   Anemia    Anxiety    ATTACKS   Arthritis    Asthma    Cataract    left cataract removal 12/2008   Chronic back pain    OA of spine   Depression    GERD (gastroesophageal reflux disease)    Endo negative 02/2000   Hypertension 03/2008   IBS (irritable bowel syndrome)    MRSA  infection 2008   Right leg   Obesity    Ovarian cyst    Retinitis pigmentosa    blind   Past Surgical History:  Procedure Laterality Date   APPENDECTOMY     BLADDER SURGERY  1968   age 15 ? dilitation   CATARACT EXTRACTION W/PHACO Right 11/11/2014   Procedure: CATARACT EXTRACTION PHACO AND INTRAOCULAR LENS PLACEMENT (IOC);  Surgeon: Sallee Lange, MD;  Location: [facility];  Service: Ophthalmology;  Laterality: Right;  LOT PACK: [id number] H [place]: 00:29.7 AP: 11.2 CDE: 7.14   CESAREAN SECTION     CHOLECYSTECTOMY     CYSTOSCOPY  03/2008    negative// Dr. Wanda Plump urologist   ENDOMETRIAL BIOPSY  07/1998   negative   EYE SURGERY  12/2008   cataract removal left   HERNIA REPAIR  05/2006   ruptured hernia repair after fall and then two more surgeries within as many weeks for complications(05/2006) Umbilical hernia repair (06/1997)   KNEE ARTHROSCOPY W/ MENISCAL REPAIR     left knne   Social History   Tobacco Use   Smoking status: Never   Smokeless tobacco: Never  Vaping Use   Vaping Use: Never used  Substance Use Topics   Alcohol use: Yes    Comment: twice a year   Drug use: Yes    Types: Marijuana   Family History  Problem Relation Age of Onset   Depression Mother    Heart disease Father    Diabetes Maternal Uncle    Hypertension Maternal Grandmother    Heart disease Paternal Grandmother    Heart disease Paternal Grandfather    Depression Brother        Depression secondary to MVA injuries   Hypertension Brother    Breast cancer Neg Hx    Allergies  Allergen Reactions   Diclofenac Sodium     REACTION: allergic/ made asthma bad   Etodolac     REACTION: GI   Metronidazole     REACTION: GI side eff   Nsaids Other (See Comments)    GI Upset   Oxybutynin Itching   Rosuvastatin     Myalgias    Sulfamethoxazole-Trimethoprim    Tolterodine Tartrate     REACTION: None Effective   Tramadol Other (See Comments)    Caused elevated BP   Sulfa Antibiotics Itching and Rash   Current Outpatient Medications on File Prior to Visit  Medication Sig Dispense Refill   Albuterol Sulfate (PROAIR HFA IN) Inhale 2 puffs into the lungs every 6 (six) hours as needed.     ALLERGY RELIEF 180 MG tablet TAKE 1 TABLET BY MOUTH EVERY DAY 90 tablet 1   Ascorbic Acid (VITAMIN C) 1000 MG tablet Take 1,000 mg by mouth daily.     Black Elderberry,Berry-Flower, 575 MG CAPS Take 1 capsule by mouth daily.     Calcium Carbonate-Vitamin D 600-400 MG-UNIT tablet Take 1 tablet by mouth daily.     Cholecalciferol (VITAMIN D3 PO) Take  1,000 Units by mouth daily.     cyclobenzaprine (FLEXERIL) 10 MG tablet Take 1 tablet (10 mg total) by mouth 3 (three) times daily as needed for muscle spasms. Caution of sedation 30 tablet 3   fluticasone (FLONASE) 50 MCG/ACT nasal spray SHAKE LIQUID AND USE 2 SPRAYS IN EACH NOSTRIL TWICE DAILY 48 g 1   lisinopril (ZESTRIL) 5 MG tablet TAKE 1 TABLET(5 MG) BY MOUTH EVERY MORNING 90 tablet 0   montelukast (SINGULAIR) 10 MG tablet TAKE 1 TABLET BY MOUTH EVERY  DAY 90 tablet 0   Omega 3-6-9 Fatty Acids (OMEGA 3-6-9 COMPLEX PO) Take one tablet by mouth daily     omeprazole (PRILOSEC) 40 MG capsule TAKE 1 CAPSULE BY MOUTH EVERY DAY 90 capsule 0   OVER THE COUNTER MEDICATION daily as needed. HYLAND'S CELL SALTS #8 MAGNESIA PHOSPHORICA     sertraline (ZOLOFT) 50 MG tablet TAKE 1 TABLET(50 MG) BY MOUTH DAILY 90 tablet 0   tolterodine (DETROL LA) 4 MG 24 hr capsule TAKE 1 CAPSULE(4 MG) BY MOUTH DAILY AS NEEDED 90 capsule 0   No current facility-administered medications on file prior to visit.   Review of Systems  Constitutional:  Negative for chills, fever and malaise/fatigue.  HENT:  Negative for congestion, ear pain, sinus pain and sore throat.   Eyes:  Positive for blurred vision. Negative for discharge and redness.  Respiratory:  Negative for cough, shortness of breath and stridor.   Cardiovascular:  Negative for chest pain, palpitations and leg swelling.  Gastrointestinal:  Negative for abdominal pain, diarrhea, nausea and vomiting.  Musculoskeletal:  Positive for joint pain. Negative for myalgias.  Skin:  Negative for rash.  Neurological:  Negative for dizziness and headaches.  Psychiatric/Behavioral:  Negative for memory loss. The patient is nervous/anxious.       Observations/Objective: Pt sounds well  In no distress Mentally sharp/good historian Mood is good today/not overly anxious  Candidly discusses symptoms and stressors    Assessment and Plan: Problem List Items Addressed This  Visit       Other   Chronic pain    Primarily knees/shoulders Rev last ortho appt note  Injections continue to help for short term  Pt is not interested in joint repl yet       Relevant Medications   HYDROcodone-acetaminophen (NORCO) 5-325 MG tablet   HYDROcodone-acetaminophen (NORCO/VICODIN) 5-325 MG tablet   HYDROcodone-acetaminophen (NORCO) 5-325 MG tablet   Encounter for chronic pain management - Primary    Overall stable  Knee and shoulder pain  norco 5-325 mg up to every 6 h prn  #90 per month  No side eff/ is helpful  UDS and contract renewed on 05/21/21 No concerns  [facility] reviewed- no red flags Refills done today for 3 months individual to fill after 7/26, 8/26, and 9/26        Follow Up Instructions: Water exercise is a good options for you   No change in medicines  Continue current therapy  Refills were sent to the pharmacy   Keep working on weight loss Continue orthopedic appointments    I discussed the assessment and treatment plan with the patient. The patient was provided an opportunity to ask questions and all were answered. The patient agreed with the plan and demonstrated an understanding of the instructions.   The patient was advised to call back or seek an in-person evaluation if the symptoms worsen or if the condition fails to improve as anticipated.  I provided 17 minutes of non-face-to-face time during this encounter.   Roxy Manns, MD

## 2021-08-10 NOTE — Patient Instructions (Addendum)
Water exercise is a good options for you   No change in medicines  Continue current therapy  Refills were sent to the pharmacy   Keep working on weight loss Continue orthopedic appointments

## 2021-08-10 NOTE — Assessment & Plan Note (Signed)
Overall stable  Knee and shoulder pain  norco 5-325 mg up to every 6 h prn  #90 per month  No side eff/ is helpful  UDS and contract renewed on 05/21/21 No concerns  N[facility]SRS reviewed- no red flags Refills done today for 3 months individual to fill after 7/26, 8/26, and 9/26

## 2021-09-30 DIAGNOSIS — M7581 Other shoulder lesions, right shoulder: Secondary | ICD-10-CM | POA: Diagnosis not present

## 2021-09-30 DIAGNOSIS — M19011 Primary osteoarthritis, right shoulder: Secondary | ICD-10-CM | POA: Diagnosis not present

## 2021-09-30 DIAGNOSIS — M1712 Unilateral primary osteoarthritis, left knee: Secondary | ICD-10-CM | POA: Diagnosis not present

## 2021-10-16 ENCOUNTER — Other Ambulatory Visit: Payer: Self-pay | Admitting: Family Medicine

## 2021-10-18 ENCOUNTER — Other Ambulatory Visit: Payer: Self-pay | Admitting: Family Medicine

## 2021-11-11 ENCOUNTER — Encounter: Payer: Self-pay | Admitting: Family Medicine

## 2021-11-11 ENCOUNTER — Telehealth (INDEPENDENT_AMBULATORY_CARE_PROVIDER_SITE_OTHER): Payer: PPO | Admitting: Family Medicine

## 2021-11-11 VITALS — Ht 61.0 in | Wt 309.0 lb

## 2021-11-11 DIAGNOSIS — G8929 Other chronic pain: Secondary | ICD-10-CM

## 2021-11-11 MED ORDER — HYDROCODONE-ACETAMINOPHEN 5-325 MG PO TABS: 1.0000 | ORAL_TABLET | Freq: Four times a day (QID) | ORAL | 0 refills | Status: DC | PRN

## 2021-11-11 NOTE — Assessment & Plan Note (Signed)
Discussed how this problem influences overall health and the risks it imposes  Reviewed plan for weight loss with lower calorie diet (via better food choices and also portion control or program like weight watchers) and exercise building up to or more than 30 minutes 5 days per week including some aerobic activity   Wt is down to 308 with better diet  bmi is 58.3  Cannot have knee repl with this high BMI Struggles with exercise due to severe knee OA  Likes to swim but no transportation to a pool

## 2021-11-11 NOTE — Patient Instructions (Signed)
Continue the norco as needed Keep working on weight loss   Follow up in 3 months for next chronic pain visit

## 2021-11-11 NOTE — Progress Notes (Signed)
Virtual Visit via Telephone Note  I connected with Monica Madden on 11/11/21 at 11:30 AM EDT by telephone and verified that I am speaking with the correct person using two identifiers.  Location: Patient: home Provider: office   I discussed the limitations, risks, security and privacy concerns of performing an evaluation and management service by telephone and the availability of in person appointments. I also discussed with the patient that there may be a patient responsible charge related to this service. The patient expressed understanding and agreed to proceed.  Parties involved in encounter  Patient: Monica Madden   Provider:  Loura Pardon MD   History of Present Illness: Pt presents for encounter for chronic pain management   Wt Readings from Last 3 Encounters:  11/11/21 (!) 309 lb (140.2 kg)  08/10/21 (!) 311 lb (141.1 kg)  05/21/21 (!) 314 lb (142.4 kg)   58.39 kg/m   Had been ok  Not doing a lot    Indication for chronic opioid: OA pain in knees and shoulders Saw ortho on 9/13 and had injections in knees and shoulder  They helped a lot  They help about 2 months  Had more xrays also   May make it another 1-2 y but will need knee replacement  Would need to loose wt for that   Wt was down to 308 lb (from 321)  Glad for that  Making the effort and eating better / has to keep avoiding sweets   Some walking in the house for exercise  Getting steps in  Some house work  Wishes she could get to a pool  She does not drive and transportation is tough    (her father or aunt occ drive her but that is hard)    Has good and bad days Cooler morning make her more stiff    Medication and dose: norco 5-325 mg up to every 6 hours prn  # pills per month: 90 Last refill 10/13/21    Last UDS date: 05/21/21  low risk  Opioid Treatment Agreement signed (Y/N): yes  Opioid Treatment Agreement last reviewed with patient:  05/21/21  [place] reviewed this encounter (include  red flags):  today, no red flags    takes alprazolam as well for anxiety   Did get her imms oct 3- flu and covid  Tolerated them well Happy about that   Patient Active Problem List   Diagnosis Date Noted   Colon cancer screening 03/17/2021   Paresthesia of both hands 08/06/2020   Medicare annual wellness visit, subsequent 12/01/2018   Encounter for chronic pain management 02/07/2017   Chronic pain 11/17/2015   Elevated glucose level 11/09/2015   Routine general medical examination at a health care facility 06/30/2011   Amenorrhea 06/30/2011   DYSPEPSIA 09/26/2009   IRRITABLE BOWEL SYNDROME 08/07/2009   KNEE PAIN, BILATERAL 08/07/2009   CONSTIPATION 01/15/2009   LEG PAIN, LEFT 10/09/2008   Hyperlipidemia 05/09/2008   Essential hypertension 03/29/2008   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 06/05/2007   INCI HERNIA WITHOUT MENTION OBSTRUCTION/GANGRENE 05/12/2007   FREQUENCY, URINARY 05/12/2007   ARTHRALGIA 11/03/2006   DEPENDENT EDEMA, LEGS 11/03/2006   Morbid obesity ([place]) 07/15/2006   ANXIETY DEPRESSION 07/15/2006   MIGRAINE HEADACHE 07/15/2006   RETINITIS PIGMENTOSA 07/15/2006   VARICOSE VEINS, LOWER EXTREMITIES 07/15/2006   ALLERGIC RHINITIS 07/15/2006   ASTHMA 07/15/2006   GERD 07/15/2006   BACK PAIN, LUMBAR, CHRONIC 07/15/2006   INCONTINENCE, URGE 07/15/2006   Past Medical History:  Diagnosis Date   Allergy    allergic rhinitis   Anemia    Anxiety    ATTACKS   Arthritis    Asthma    Cataract    left cataract removal 12/2008   Chronic back pain    OA of spine   Depression    GERD (gastroesophageal reflux disease)    Endo negative 02/2000   Hypertension 03/2008   IBS (irritable bowel syndrome)    MRSA infection 2008   Right leg   Obesity    Ovarian cyst    Retinitis pigmentosa    blind   Past Surgical History:  Procedure Laterality Date   APPENDECTOMY     BLADDER SURGERY  1968   age 53 ? dilitation   CATARACT EXTRACTION W/PHACO Right  11/11/2014   Procedure: CATARACT EXTRACTION PHACO AND INTRAOCULAR LENS PLACEMENT (IOC);  Surgeon: Sallee Lange, MD;  Location: [facility];  Service: Ophthalmology;  Laterality: Right;  LOT PACK: [id number] H [place]: 00:29.7 AP: 11.2 CDE: 7.14   CESAREAN SECTION     CHOLECYSTECTOMY     CYSTOSCOPY  03/2008   negative// Dr. Wanda Plump urologist   ENDOMETRIAL BIOPSY  07/1998   negative   EYE SURGERY  12/2008   cataract removal left   HERNIA REPAIR  05/2006   ruptured hernia repair after fall and then two more surgeries within as many weeks for complications(05/2006) Umbilical hernia repair (06/1997)   KNEE ARTHROSCOPY W/ MENISCAL REPAIR     left knne   Social History   Tobacco Use   Smoking status: Never   Smokeless tobacco: Never  Vaping Use   Vaping Use: Never used  Substance Use Topics   Alcohol use: Yes    Comment: twice a year   Drug use: Yes    Types: Marijuana   Family History  Problem Relation Age of Onset   Depression Mother    Heart disease Father    Diabetes Maternal Uncle    Hypertension Maternal Grandmother    Heart disease Paternal Grandmother    Heart disease Paternal Grandfather    Depression Brother        Depression secondary to MVA injuries   Hypertension Brother    Breast cancer Neg Hx    Allergies  Allergen Reactions   Diclofenac Sodium     REACTION: allergic/ made asthma bad   Etodolac     REACTION: GI   Metronidazole     REACTION: GI side eff   Nsaids Other (See Comments)    GI Upset   Oxybutynin Itching   Rosuvastatin     Myalgias    Sulfamethoxazole-Trimethoprim    Tolterodine Tartrate     REACTION: None Effective   Tramadol Other (See Comments)    Caused elevated BP   Sulfa Antibiotics Itching and Rash   Current Outpatient Medications on File Prior to Visit  Medication Sig Dispense Refill   Albuterol Sulfate (PROAIR HFA IN) Inhale 2 puffs into the lungs every 6 (six) hours as needed.     ALLERGY RELIEF 180 MG tablet TAKE 1 TABLET BY  MOUTH EVERY DAY 90 tablet 1   ALPRAZolam (XANAX) 1 MG tablet TAKE 1 TABLET(1 MG) BY MOUTH THREE TIMES DAILY AS NEEDED 90 tablet 3   Ascorbic Acid (VITAMIN C) 1000 MG tablet Take 1,000 mg by mouth daily.     Black Elderberry,Berry-Flower, 575 MG CAPS Take 1 capsule by mouth daily.     Calcium Carbonate-Vitamin D 600-400 MG-UNIT tablet Take 1 tablet by  mouth daily.     Cholecalciferol (VITAMIN D3 PO) Take 1,000 Units by mouth daily.     cyclobenzaprine (FLEXERIL) 10 MG tablet Take 1 tablet (10 mg total) by mouth 3 (three) times daily as needed for muscle spasms. Caution of sedation 30 tablet 3   fluticasone (FLONASE) 50 MCG/ACT nasal spray SHAKE LIQUID AND USE 2 SPRAYS IN EACH NOSTRIL TWICE DAILY 48 g 1   lisinopril (ZESTRIL) 5 MG tablet TAKE 1 TABLET(5 MG) BY MOUTH EVERY MORNING 90 tablet 2   montelukast (SINGULAIR) 10 MG tablet TAKE 1 TABLET BY MOUTH EVERY DAY 90 tablet 2   Omega 3-6-9 Fatty Acids (OMEGA 3-6-9 COMPLEX PO) Take one tablet by mouth daily     omeprazole (PRILOSEC) 40 MG capsule TAKE 1 CAPSULE BY MOUTH EVERY DAY 90 capsule 2   OVER THE COUNTER MEDICATION daily as needed. HYLAND'S CELL SALTS #8 MAGNESIA PHOSPHORICA     sertraline (ZOLOFT) 50 MG tablet TAKE 1 TABLET(50 MG) BY MOUTH DAILY 90 tablet 2   tolterodine (DETROL LA) 4 MG 24 hr capsule TAKE 1 CAPSULE(4 MG) BY MOUTH DAILY AS NEEDED 90 capsule 0   No current facility-administered medications on file prior to visit.   Review of Systems  Constitutional:  Negative for chills, fever and malaise/fatigue.  HENT:  Negative for congestion, ear pain, sinus pain and sore throat.   Eyes:  Positive for blurred vision. Negative for discharge and redness.  Respiratory:  Negative for cough, shortness of breath and stridor.   Cardiovascular:  Negative for chest pain, palpitations and leg swelling.  Gastrointestinal:  Negative for abdominal pain, diarrhea, nausea and vomiting.  Musculoskeletal:  Positive for joint pain. Negative for myalgias.   Skin:  Negative for rash.  Neurological:  Negative for dizziness and headaches.    Observations/Objective: Pt sounds well Like her usual self  Not distressed or tearful Nl mood  Nl cognition/good historian     Assessment and Plan: Problem List Items Addressed This Visit       Other   Chronic pain    Knees and shoulders  Is due for a knee replacement (working on wt loss for that)  Getting injections for orthopedics  Gets around/ limited wt bearing  Will continue to follow  norco refills done today      Relevant Medications   HYDROcodone-acetaminophen (NORCO) 5-325 MG tablet   HYDROcodone-acetaminophen (NORCO) 5-325 MG tablet   HYDROcodone-acetaminophen (NORCO/VICODIN) 5-325 MG tablet   Encounter for chronic pain management - Primary    Doing well with norco 5-325 mg up to every 6 h prn for knee and shoulder arthritis pain #90 per month  No side effects UDS and contract review on 05/21/21, up to date with low risk drug screen NCCSRS reviewed with no ref flags Refills done to fill on 10/26, 11/26 and 12/26  Continues orthopedic care also      Morbid obesity (HCC)    Discussed how this problem influences overall health and the risks it imposes  Reviewed plan for weight loss with lower calorie diet (via better food choices and also portion control or program like weight watchers) and exercise building up to or more than 30 minutes 5 days per week including some aerobic activity   Wt is down to 308 with better diet  bmi is 58.3  Cannot have knee repl with this high BMI Struggles with exercise due to severe knee OA  Likes to swim but no transportation to a pool  Follow Up Instructions: Continue the norco as needed Keep working on weight loss   Follow up in 3 months for next chronic pain visit    I discussed the assessment and treatment plan with the patient. The patient was provided an opportunity to ask questions and all were answered. The patient agreed  with the plan and demonstrated an understanding of the instructions.   The patient was advised to call back or seek an in-person evaluation if the symptoms worsen or if the condition fails to improve as anticipated.  I provided 16 minutes of non-face-to-face time during this encounter.   Roxy Manns, MD

## 2021-11-11 NOTE — Assessment & Plan Note (Signed)
Knees and shoulders  Is due for a knee replacement (working on wt loss for that)  Getting injections for orthopedics  Gets around/ limited wt bearing  Will continue to follow  norco refills done today

## 2021-11-11 NOTE — Assessment & Plan Note (Signed)
Doing well with norco 5-325 mg up to every 6 h prn for knee and shoulder arthritis pain #90 per month  No side effects UDS and contract review on 05/21/21, up to date with low risk drug screen NCCSRS reviewed with no ref flags Refills done to fill on 10/26, 11/26 and 12/26  Continues orthopedic care also

## 2021-12-09 ENCOUNTER — Ambulatory Visit (INDEPENDENT_AMBULATORY_CARE_PROVIDER_SITE_OTHER): Payer: PPO

## 2021-12-09 VITALS — Ht 61.0 in | Wt 314.0 lb

## 2021-12-09 DIAGNOSIS — Z0000 Encounter for general adult medical examination without abnormal findings: Secondary | ICD-10-CM | POA: Diagnosis not present

## 2021-12-09 NOTE — Progress Notes (Signed)
Virtual Visit via Telephone Note  I connected with  Monica Madden on 12/09/21 at 11:15 AM EST by telephone and verified that I am speaking with the correct person using two identifiers.  Location: Patient: home Provider: [place] Persons participating in the virtual visit: Monica Madden   I discussed the limitations, risks, security and privacy concerns of performing an evaluation and management service by telephone and the availability of in person appointments. The patient expressed understanding and agreed to proceed.  Interactive audio and video telecommunications were attempted between this nurse and patient, however failed, due to patient having technical difficulties OR patient did not have access to video capability.  We continued and completed visit with audio only.  Some vital signs may be absent or patient reported.   Monica David, LPN  Subjective:   Monica Madden is a 61 y.o. female who presents for Medicare Annual (Subsequent) preventive examination.  Review of Systems     Cardiac Risk Factors include: advanced age (>87men, >14 women);dyslipidemia;obesity (BMI >30kg/m2)     Objective:    Today's Vitals   12/09/21 1114  PainSc: 4    There is no height or weight on file to calculate BMI.     12/09/2021   11:24 AM 12/03/2020   11:22 AM 11/22/2017    8:58 AM 03/25/2016    1:55 PM 11/11/2014    7:23 AM  Advanced Directives  Does Patient Have a Medical Advance Directive? No Yes Yes Yes Yes  Type of Corporate treasurer of [place];Living will Living will [place];Living will Living will  Does patient want to make changes to medical advance directive?  Yes (MAU/Ambulatory/Procedural Areas - Information given)   No - Patient declined  Copy of [place] in Chart?    No - copy requested No - copy requested  Would patient like information on creating a medical advance directive? No -  Patient declined        Current Medications (verified) Outpatient Encounter Medications as of 12/09/2021  Medication Sig   Albuterol Sulfate (PROAIR HFA IN) Inhale 2 puffs into the lungs every 6 (six) hours as needed.   ALLERGY RELIEF 180 MG tablet TAKE 1 TABLET BY MOUTH EVERY DAY   ALPRAZolam (XANAX) 1 MG tablet TAKE 1 TABLET(1 MG) BY MOUTH THREE TIMES DAILY AS NEEDED   Ascorbic Acid (VITAMIN C) 1000 MG tablet Take 1,000 mg by mouth daily.   Black Elderberry,Berry-Flower, 575 MG CAPS Take 1 capsule by mouth daily.   Calcium Carbonate-Vitamin D 600-400 MG-UNIT tablet Take 1 tablet by mouth daily.   cyclobenzaprine (FLEXERIL) 10 MG tablet Take 1 tablet (10 mg total) by mouth 3 (three) times daily as needed for muscle spasms. Caution of sedation   fluticasone (FLONASE) 50 MCG/ACT nasal spray SHAKE LIQUID AND USE 2 SPRAYS IN EACH NOSTRIL TWICE DAILY   HYDROcodone-acetaminophen (NORCO) 5-325 MG tablet Take 1 tablet by mouth every 6 (six) hours as needed for moderate pain or severe pain. Do not fill before  01/12/22   lisinopril (ZESTRIL) 5 MG tablet TAKE 1 TABLET(5 MG) BY MOUTH EVERY MORNING   montelukast (SINGULAIR) 10 MG tablet TAKE 1 TABLET BY MOUTH EVERY DAY   Omega 3-6-9 Fatty Acids (OMEGA 3-6-9 COMPLEX PO) Take one tablet by mouth daily   omeprazole (PRILOSEC) 40 MG capsule TAKE 1 CAPSULE BY MOUTH EVERY DAY   OVER THE COUNTER MEDICATION daily as needed. HYLAND'S CELL SALTS #8 MAGNESIA  PHOSPHORICA   sertraline (ZOLOFT) 50 MG tablet TAKE 1 TABLET(50 MG) BY MOUTH DAILY   tolterodine (DETROL LA) 4 MG 24 hr capsule TAKE 1 CAPSULE(4 MG) BY MOUTH DAILY AS NEEDED   Cholecalciferol (VITAMIN D3 PO) Take 1,000 Units by mouth daily. (Patient not taking: Reported on 12/09/2021)   [DISCONTINUED] HYDROcodone-acetaminophen (NORCO) 5-325 MG tablet Take 1 tablet by mouth every 6 (six) hours as needed for moderate pain or severe pain. Do not fill before 11/26//23   [DISCONTINUED] HYDROcodone-acetaminophen  (NORCO/VICODIN) 5-325 MG tablet Take 1 tablet by mouth every 6 (six) hours as needed for moderate pain. Do not fill before 11/12/21   No facility-administered encounter medications on file as of 12/09/2021.    Allergies (verified) Diclofenac sodium, Etodolac, Metronidazole, Nsaids, Oxybutynin, Rosuvastatin, Sulfamethoxazole-trimethoprim, Tolterodine tartrate, Tramadol, and Sulfa antibiotics   History: Past Medical History:  Diagnosis Date   Allergy    allergic rhinitis   Anemia    Anxiety    ATTACKS   Arthritis    Asthma    Cataract    left cataract removal 12/2008   Chronic back pain    OA of spine   Depression    GERD (gastroesophageal reflux disease)    Endo negative 02/2000   Hypertension 03/2008   IBS (irritable bowel syndrome)    MRSA infection 2008   Right leg   Obesity    Ovarian cyst    Retinitis pigmentosa    blind   Past Surgical History:  Procedure Laterality Date   APPENDECTOMY     BLADDER SURGERY  1968   age 74 ? dilitation   CATARACT EXTRACTION W/PHACO Right 11/11/2014   Procedure: CATARACT EXTRACTION PHACO AND INTRAOCULAR LENS PLACEMENT (IOC);  Surgeon: Sallee LangeSteven Dingeldein, MD;  Location: [facility] ORS;  Service: Ophthalmology;  Laterality: Right;  LOT PACK: [id number] H US: 00:29.7 AP: 11.2 CDE: 7.14   CESAREAN SECTION     CHOLECYSTECTOMY     CYSTOSCOPY  03/2008   negative// Dr. Wanda PlumpHumphries urologist   ENDOMETRIAL BIOPSY  07/1998   negative   EYE SURGERY  12/2008   cataract removal left   HERNIA REPAIR  05/2006   ruptured hernia repair after fall and then two more surgeries within as many weeks for complications(05/2006) Umbilical hernia repair (06/1997)   KNEE ARTHROSCOPY W/ MENISCAL REPAIR     left knne   Family History  Problem Relation Age of Onset   Depression Mother    Heart disease Father    Diabetes Maternal Uncle    Hypertension Maternal Grandmother    Heart disease Paternal Grandmother    Heart disease Paternal Grandfather    Depression  Brother        Depression secondary to MVA injuries   Hypertension Brother    Breast cancer Neg Hx    Social History   Socioeconomic History   Marital status: Married    Spouse name: Not on file   Number of children: Not on file   Years of education: Not on file   Highest education level: Not on file  Occupational History   Not on file  Tobacco Use   Smoking status: Never   Smokeless tobacco: Never  Vaping Use   Vaping Use: Never used  Substance and Sexual Activity   Alcohol use: Yes    Comment: twice a year   Drug use: Yes    Types: Marijuana   Sexual activity: Never  Other Topics Concern   Not on file  Social History Narrative  Vision impaired    Social Determinants of Health   Financial Resource Strain: Low Risk  (12/09/2021)   Overall Financial Resource Strain (CARDIA)    Difficulty of Paying Living Expenses: Not very hard  Food Insecurity: No Food Insecurity (12/09/2021)   Hunger Vital Sign    Worried About Running Out of Food in the Last Year: Never true    Ran Out of Food in the Last Year: Never true  Transportation Needs: No Transportation Needs (12/09/2021)   PRAPARE - Hydrologist (Medical): No    Lack of Transportation (Non-Medical): No  Physical Activity: Insufficiently Active (12/09/2021)   Exercise Vital Sign    Days of Exercise per Week: 4 days    Minutes of Exercise per Session: 30 min  Stress: No Stress Concern Present (12/09/2021)   [place]    Feeling of Stress : Only a little  Social Connections: Moderately Isolated (12/09/2021)   Social Connection and Isolation Panel [NHANES]    Frequency of Communication with Friends and Family: More than three times a week    Frequency of Social Gatherings with Friends and Family: Once a week    Attends Religious Services: Never    Marine scientist or Organizations: No    Attends Arts administrator: Never    Marital Status: Married    Tobacco Counseling Counseling given: Not Answered   Clinical Intake:  Pre-visit preparation completed: Yes  Pain : 0-10 Pain Score: 4  Pain Location: Knee     Nutritional Risks: None Diabetes: No  How often do you need to have someone help you when you read instructions, pamphlets, or other written materials from your doctor or pharmacy?: 1 - Never  Diabetic?no  Interpreter Needed?: No  Information entered by :: Monica Shaggy, LPN   Activities of Daily Living    12/09/2021   11:25 AM  In your present state of health, do you have any difficulty performing the following activities:  Hearing? 0  Vision? 1  Difficulty concentrating or making decisions? 0  Walking or climbing stairs? 1  Dressing or bathing? 0  Doing errands, shopping? 0  Preparing Food and eating ? N  Using the Toilet? N  In the past six months, have you accidently leaked urine? N  Do you have problems with loss of bowel control? N  Managing your Medications? N  Managing your Finances? N  Housekeeping or managing your Housekeeping? N    Patient Care Team: Tower, Wynelle Fanny, MD as PCP - General  Indicate any recent Medical Services you may have received from other than [facility] providers in the past year (date may be approximate).     Assessment:   This is a routine wellness examination for Monica Madden.  Hearing/Vision screen Hearing Screening - Comments:: No aids Vision Screening - Comments:: Legally blind- [facility]  Dietary issues and exercise activities discussed: Current Exercise Habits: Home exercise routine, Type of exercise: walking, Time (Minutes): 30, Frequency (Times/Week): 4, Weekly Exercise (Minutes/Week): 120, Intensity: Mild   Goals Addressed               This Visit's Progress     Weight (lb) < 200 lb (90.7 kg) (pt-stated)        Has to lose weight for knee replacement       Depression Screen    12/09/2021   11:21 AM  12/03/2020   11:24 AM 12/03/2019  11:18 AM 12/01/2018   10:51 AM 12/01/2018   10:27 AM 05/05/2018   11:05 AM 11/22/2017    8:55 AM  PHQ 2/9 Scores  PHQ - 2 Score 1 0 0 0 0 0 2  PHQ- 9 Score 1   1  4 5     Fall Risk    12/09/2021   11:25 AM 12/03/2020   11:24 AM 12/03/2019   11:18 AM 12/01/2018   10:26 AM 11/22/2017    8:55 AM  Fall Risk   Falls in the past year? 0 0 0 0 0  Number falls in past yr: 0 0     Injury with Fall? 0 0     Risk for fall due to : No Fall Risks Impaired mobility     Follow up Falls prevention discussed;Falls evaluation completed Falls prevention discussed Falls evaluation completed Falls evaluation completed     FALL RISK PREVENTION PERTAINING TO THE HOME:  Any stairs in or around the home? No  If so, are there any without handrails? No  Home free of loose throw rugs in walkways, pet beds, electrical cords, etc? Yes  Adequate lighting in your home to reduce risk of falls? Yes   ASSISTIVE DEVICES UTILIZED TO PREVENT FALLS:  Life alert? No  Use of a cane, walker or w/c? Yes  Grab bars in the bathroom? Yes  Shower chair or bench in shower? No  Elevated toilet seat or a handicapped toilet? No    Cognitive Function:    11/22/2017    8:56 AM 03/25/2016    2:22 PM  MMSE - Mini Mental State Exam  Orientation to time 5 5  Orientation to Place 5 5  Registration 3 3  Attention/ Calculation 0 0  Recall 3 3  Language- name 2 objects 0 0  Language- repeat 1 1  Language- follow 3 step command 3 0  Language- follow 3 step command-comments  this was not assessed due to pt is legally blind  Language- read & follow direction 0 0  Write a sentence 0 0  Copy design 0 0  Total score 20 17        12/09/2021   11:31 AM  6CIT Screen  What Year? 0 points  What month? 0 points  What time? 0 points  Count back from 20 0 points  Months in reverse 0 points  Repeat phrase 0 points  Total Score 0 points    Immunizations Immunization History   Administered Date(s) Administered   Influenza Inj Mdck Quad Pf 10/21/2015, 10/19/2017, 10/10/2020   Influenza Split 01/01/2011   Influenza,inj,Quad PF,6+ Mos 10/30/2014, 10/19/2017, 12/01/2018, 12/03/2019, 10/20/2021   Influenza-Unspecified 10/27/2012, 10/21/2015, 10/19/2016   Moderna Covid-19 Vaccine Bivalent Booster 51yrs & up 10/10/2020   Moderna Sars-Covid-2 Vaccination 04/11/2019, 05/15/2019, 11/27/2019   PFIZER Comirnaty(Gray Top)Covid-19 Tri-Sucrose Vaccine 10/20/2021   Pneumococcal Polysaccharide-23 06/30/2011, 12/01/2018   Td 07/28/2004   Tdap 10/30/2014    TDAP status: Up to date  Flu Vaccine status: Up to date  Pneumococcal vaccine status: Up to date  Covid-19 vaccine status: Completed vaccines  Qualifies for Shingles Vaccine? Yes   Zostavax completed No   Shingrix Completed?: Yes  Screening Tests Health Maintenance  Topic Date Due   Zoster Vaccines- Shingrix (1 of 2) Never done   COLONOSCOPY (Pts 45-53yrs Insurance coverage will need to be confirmed)  03/17/2022 (Originally 10/03/2019)   HIV Screening  02/20/2023 (Originally 07/13/1975)   MAMMOGRAM  12/15/2021   COVID-19 Vaccine (6 - 2023-24  season) 12/15/2021   Medicare Annual Wellness (AWV)  12/10/2022   PAP SMEAR-Modifier  05/21/2024   INFLUENZA VACCINE  Completed   Hepatitis C Screening  Completed   HPV VACCINES  Aged Out    Health Maintenance  Health Maintenance Due  Topic Date Due   Zoster Vaccines- Shingrix (1 of 2) Never done    Colorectal cancer screening: Type of screening: Cologuard. Completed 04/06/21. Repeat every 3 years  Mammogram status: Ordered 05/21/21. Pt provided with contact info and advised to call to schedule appt. - is going for one in December/January  [facility] declined referral  Lung Cancer Screening: (Low Dose CT Chest recommended if Age 61-80 years, 30 pack-year currently smoking OR have quit w/in 15years.) does not qualify.   Additional Screening:  Hepatitis C Screening: does  qualify; Completed 10/23/14  Vision Screening: Recommended annual ophthalmology exams for early detection of glaucoma and other disorders of the eye. Is the patient up to date with their annual eye exam?  Yes  Who is the provider or what is the name of the office in which the patient attends annual eye exams? [facility] If pt is not established with a provider, would they like to be referred to a provider to establish care? No .   Dental Screening: Recommended annual dental exams for proper oral hygiene  Community Resource Referral / Chronic Care Management: CRR required this visit?  No   CCM required this visit?  No      Plan:     I have personally reviewed and noted the following in the patient's chart:   Medical and social history Use of alcohol, tobacco or illicit drugs  Current medications and supplements including opioid prescriptions. Patient is currently taking opioid prescriptions. Information provided to patient regarding non-opioid alternatives. Patient advised to discuss non-opioid treatment plan with their provider. Functional ability and status Nutritional status Physical activity Advanced directives List of other physicians Hospitalizations, surgeries, and ER visits in previous 12 months Vitals Screenings to include cognitive, depression, and falls Referrals and appointments  In addition, I have reviewed and discussed with patient certain preventive protocols, quality metrics, and best practice recommendations. A written personalized care plan for preventive services as well as general preventive health recommendations were provided to patient.     Hal Hope, LPN   [id number]   Nurse Notes: none

## 2021-12-09 NOTE — Patient Instructions (Signed)
Monica Madden , Thank you for taking time to come for your Medicare Wellness Visit. I appreciate your ongoing commitment to your health goals. Please review the following plan we discussed and let me know if I can assist you in the future.   Screening recommendations/referrals: Colonoscopy: Cologuard 04/06/21 Mammogram: ordered 05/21/21, will have in December/January w/ her aunt Bone Density: declined Recommended yearly ophthalmology/optometry visit for glaucoma screening and checkup Recommended yearly dental visit for hygiene and checkup  Vaccinations: Influenza vaccine: 10/20/21 Pneumococcal vaccine: 12/01/18 Tdap vaccine: 10/30/14 Shingles vaccine: n/d   Covid-19:04/11/19, 05/15/19, 11/27/19, 10/10/20, 10/20/21  Advanced directives: no  Conditions/risks identified: none  Next appointment: Follow up in one year for your annual wellness visit 12/13/22 @ 11:30 am by phone   Preventive Care 65 Years and Older, Female Preventive care refers to lifestyle choices and visits with your health care provider that can promote health and wellness. What does preventive care include? A yearly physical exam. This is also called an annual well check. Dental exams once or twice a year. Routine eye exams. Ask your health care provider how often you should have your eyes checked. Personal lifestyle choices, including: Daily care of your teeth and gums. Regular physical activity. Eating a healthy diet. Avoiding tobacco and drug use. Limiting alcohol use. Practicing safe sex. Taking low-dose aspirin every day. Taking vitamin and mineral supplements as recommended by your health care provider. What happens during an annual well check? The services and screenings done by your health care provider during your annual well check will depend on your age, overall health, lifestyle risk factors, and family history of disease. Counseling  Your health care provider may ask you questions about your: Alcohol  use. Tobacco use. Drug use. Emotional well-being. Home and relationship well-being. Sexual activity. Eating habits. History of falls. Memory and ability to understand (cognition). Work and work Astronomer. Reproductive health. Screening  You may have the following tests or measurements: Height, weight, and BMI. Blood pressure. Lipid and cholesterol levels. These may be checked every 5 years, or more frequently if you are over 62 years old. Skin check. Lung cancer screening. You may have this screening every year starting at age 53 if you have a 30-pack-year history of smoking and currently smoke or have quit within the past 15 years. Fecal occult blood test (FOBT) of the stool. You may have this test every year starting at age 42. Flexible sigmoidoscopy or colonoscopy. You may have a sigmoidoscopy every 5 years or a colonoscopy every 10 years starting at age 75. Hepatitis C blood test. Hepatitis B blood test. Sexually transmitted disease (STD) testing. Diabetes screening. This is done by checking your blood sugar (glucose) after you have not eaten for a while (fasting). You may have this done every 1-3 years. Bone density scan. This is done to screen for osteoporosis. You may have this done starting at age 60. Mammogram. This may be done every 1-2 years. Talk to your health care provider about how often you should have regular mammograms. Talk with your health care provider about your test results, treatment options, and if necessary, the need for more tests. Vaccines  Your health care provider may recommend certain vaccines, such as: Influenza vaccine. This is recommended every year. Tetanus, diphtheria, and acellular pertussis (Tdap, Td) vaccine. You may need a Td booster every 10 years. Zoster vaccine. You may need this after age 51. Pneumococcal 13-valent conjugate (PCV13) vaccine. One dose is recommended after age 29. Pneumococcal polysaccharide (PPSV23) vaccine. One  dose is  recommended after age 47. Talk to your health care provider about which screenings and vaccines you need and how often you need them. This information is not intended to replace advice given to you by your health care provider. Make sure you discuss any questions you have with your health care provider. Document Released: 01/31/2015 Document Revised: 09/24/2015 Document Reviewed: 11/05/2014 Elsevier Interactive Patient Education  2017 [place] Prevention in the Home Falls can cause injuries. They can happen to people of all ages. There are many things you can do to make your home safe and to help prevent falls. What can I do on the outside of my home? Regularly fix the edges of walkways and driveways and fix any cracks. Remove anything that might make you trip as you walk through a door, such as a raised step or threshold. Trim any bushes or trees on the path to your home. Use bright outdoor lighting. Clear any walking paths of anything that might make someone trip, such as rocks or tools. Regularly check to see if handrails are loose or broken. Make sure that both sides of any steps have handrails. Any raised decks and porches should have guardrails on the edges. Have any leaves, snow, or ice cleared regularly. Use sand or salt on walking paths during winter. Clean up any spills in your garage right away. This includes oil or grease spills. What can I do in the bathroom? Use night lights. Install grab bars by the toilet and in the tub and shower. Do not use towel bars as grab bars. Use non-skid mats or decals in the tub or shower. If you need to sit down in the shower, use a plastic, non-slip stool. Keep the floor dry. Clean up any water that spills on the floor as soon as it happens. Remove soap buildup in the tub or shower regularly. Attach bath mats securely with double-sided non-slip rug tape. Do not have throw rugs and other things on the floor that can make you  trip. What can I do in the bedroom? Use night lights. Make sure that you have a light by your bed that is easy to reach. Do not use any sheets or blankets that are too big for your bed. They should not hang down onto the floor. Have a firm chair that has side arms. You can use this for support while you get dressed. Do not have throw rugs and other things on the floor that can make you trip. What can I do in the kitchen? Clean up any spills right away. Avoid walking on wet floors. Keep items that you use a lot in easy-to-reach places. If you need to reach something above you, use a strong step stool that has a grab bar. Keep electrical cords out of the way. Do not use floor polish or wax that makes floors slippery. If you must use wax, use non-skid floor wax. Do not have throw rugs and other things on the floor that can make you trip. What can I do with my stairs? Do not leave any items on the stairs. Make sure that there are handrails on both sides of the stairs and use them. Fix handrails that are broken or loose. Make sure that handrails are as long as the stairways. Check any carpeting to make sure that it is firmly attached to the stairs. Fix any carpet that is loose or worn. Avoid having throw rugs at the top or bottom of the  stairs. If you do have throw rugs, attach them to the floor with carpet tape. Make sure that you have a light switch at the top of the stairs and the bottom of the stairs. If you do not have them, ask someone to add them for you. What else can I do to help prevent falls? Wear shoes that: Do not have high heels. Have rubber bottoms. Are comfortable and fit you well. Are closed at the toe. Do not wear sandals. If you use a stepladder: Make sure that it is fully opened. Do not climb a closed stepladder. Make sure that both sides of the stepladder are locked into place. Ask someone to hold it for you, if possible. Clearly mark and make sure that you can  see: Any grab bars or handrails. First and last steps. Where the edge of each step is. Use tools that help you move around (mobility aids) if they are needed. These include: Canes. Walkers. Scooters. Crutches. Turn on the lights when you go into a dark area. Replace any light bulbs as soon as they burn out. Set up your furniture so you have a clear path. Avoid moving your furniture around. If any of your floors are uneven, fix them. If there are any pets around you, be aware of where they are. Review your medicines with your doctor. Some medicines can make you feel dizzy. This can increase your chance of falling. Ask your doctor what other things that you can do to help prevent falls. This information is not intended to replace advice given to you by your health care provider. Make sure you discuss any questions you have with your health care provider. Document Released: 10/31/2008 Document Revised: 06/12/2015 Document Reviewed: 02/08/2014 Elsevier Interactive Patient Education  2017 [employer].

## 2021-12-12 ENCOUNTER — Other Ambulatory Visit: Payer: Self-pay | Admitting: Family Medicine

## 2021-12-15 ENCOUNTER — Encounter: Payer: Self-pay | Admitting: Family Medicine

## 2021-12-15 NOTE — Telephone Encounter (Signed)
Refill request sent to PCP.

## 2021-12-15 NOTE — Telephone Encounter (Signed)
Name of Medication: Xanax Name of Pharmacy: [facility] [address] Last Fill or Written Date and Quantity: 08/10/21 #90 tabs/ 3 refills Last Office Visit and Type: med f/u on 11/11/21 Next Office Visit and Type: CPE on 03/17/22 Last Controlled Substance Agreement Date: 05/21/21 Last UDS:05/21/21

## 2022-01-01 DIAGNOSIS — M19011 Primary osteoarthritis, right shoulder: Secondary | ICD-10-CM | POA: Diagnosis not present

## 2022-01-01 DIAGNOSIS — M7581 Other shoulder lesions, right shoulder: Secondary | ICD-10-CM | POA: Diagnosis not present

## 2022-01-01 DIAGNOSIS — M1712 Unilateral primary osteoarthritis, left knee: Secondary | ICD-10-CM | POA: Diagnosis not present

## 2022-01-11 ENCOUNTER — Other Ambulatory Visit: Payer: Self-pay | Admitting: Family Medicine

## 2022-02-10 ENCOUNTER — Encounter: Payer: Self-pay | Admitting: Family Medicine

## 2022-02-11 ENCOUNTER — Encounter: Payer: Self-pay | Admitting: Family Medicine

## 2022-02-11 ENCOUNTER — Telehealth (INDEPENDENT_AMBULATORY_CARE_PROVIDER_SITE_OTHER): Payer: Self-pay | Admitting: Family Medicine

## 2022-02-11 DIAGNOSIS — G8929 Other chronic pain: Secondary | ICD-10-CM

## 2022-02-11 MED ORDER — HYDROCODONE-ACETAMINOPHEN 5-325 MG PO TABS: 1.0000 | ORAL_TABLET | Freq: Four times a day (QID) | ORAL | 0 refills | Status: DC | PRN

## 2022-02-11 NOTE — Patient Instructions (Signed)
Continue current medicines Be as active as you can be  Keep working on weight loss  No change in medicines  Continue orthopedic follow up

## 2022-02-11 NOTE — Progress Notes (Signed)
   Subjective:    Patient ID: Monica Madden, female    DOB: [birth year], 62 y.o.   MRN: [id number]  HPI Pt presents for enc for chronic pain management   Doing about the same  Tends to stay in most of the time   Indication for chronic opioid: OA pain knees and shoulders Her pain was worse today / knee and lower back especially -worse with the weather changes as well  Has had to do more cleaning recently   A lot going on at home  Garage door broke/ tracking mud in house Driveway washed out  Internet went out    Appt with Dr Arvella Nigh on 12/15 Had some injections- they really do help  Helped for a month   Knee repl will have to happen   Has lost more weight  Eating better and eating well/better   Thinks she was 304or 306 in dec Wt Readings from Last 3 Encounters:  12/09/21 (!) 314 lb (142.4 kg)  11/11/21 (!) 309 lb (140.2 kg)  08/10/21 (!) 311 lb (141.1 kg)   Goal is to be below 300   Added collagen recently (supplement) and she thinks it helped a bit with joint pain  Can afford 1/2 of the dose    Medication and dose: norco 5-325 mg up to every 6 hours prn   # pills per month: 90  Last UDS date: 05/21/21 low risk  Opioid Treatment Agreement signed (Y/N): yes   Opioid Treatment Agreement last reviewed with patient:  05/21/21    [facility] reviewed this encounter (include red flags):  no red flags Reviewed today  Has annual exam planned in February    Review of Systems     Objective:   Physical Exam        Assessment & Plan:

## 2022-02-11 NOTE — Progress Notes (Signed)
Virtual Visit via Telephone Note  I connected with Monica Madden on 02/11/22 at 10:30 AM EST by telephone and verified that I am speaking with the correct person using two identifiers.  Location: Patient: home Provider: office    I discussed the limitations, risks, security and privacy concerns of performing an evaluation and management service by telephone and the availability of in person appointments. I also discussed with the patient that there may be a patient responsible charge related to this service. The patient expressed understanding and agreed to proceed.  Parties involved in encounter  Patient: Monica Madden  Provider:  Loura Pardon MD   History of Present Illness:   Pt presents for enc for chronic pain management   Doing about the same  Tends to stay in most of the time   Indication for chronic opioid: OA pain knees and shoulders Her pain was worse today / knee and lower back especially -worse with the weather changes as well  Has had to do more cleaning recently   A lot going on at home  Garage door broke/ tracking mud in house Driveway washed out  Internet went out    Appt with Dr Arvella Nigh on 12/15 Had some injections- they really do help  Helped for a month   Knee repl will have to happen   Has lost more weight  Eating better and eating well/better   Thinks she was 304or 306 in dec Wt Readings from Last 3 Encounters:  12/09/21 (!) 314 lb (142.4 kg)  11/11/21 (!) 309 lb (140.2 kg)  08/10/21 (!) 311 lb (141.1 kg)   Goal is to be below 300   Added collagen recently (supplement) and she thinks it helped a bit with joint pain  Can afford 1/2 of the dose    Medication and dose: norco 5-325 mg up to every 6 hours prn   # pills per month: 90  Last UDS date: 05/21/21 low risk  Opioid Treatment Agreement signed (Y/N): yes   Opioid Treatment Agreement last reviewed with patient:  05/21/21    N[facility]SRS reviewed this encounter (include red flags):  no red  flags Reviewed today  Has annual exam planned in February   Patient Active Problem List   Diagnosis Date Noted   Colon cancer screening 03/17/2021   Paresthesia of both hands 08/06/2020   Medicare annual wellness visit, subsequent 12/01/2018   Encounter for chronic pain management 02/07/2017   Chronic pain 11/17/2015   Elevated glucose level 11/09/2015   Mild intermittent asthma without complication 93/79/0240   Osteoarthritis of knee 07/18/2013   Routine general medical examination at a health care facility 06/30/2011   Amenorrhea 06/30/2011   DYSPEPSIA 09/26/2009   IRRITABLE BOWEL SYNDROME 08/07/2009   KNEE PAIN, BILATERAL 08/07/2009   Pain in joint involving lower leg 08/07/2009   CONSTIPATION 01/15/2009   LEG PAIN, LEFT 10/09/2008   Hyperlipidemia 05/09/2008   Essential hypertension 03/29/2008   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 06/05/2007   INCI HERNIA WITHOUT MENTION OBSTRUCTION/GANGRENE 05/12/2007   FREQUENCY, URINARY 05/12/2007   ARTHRALGIA 11/03/2006   DEPENDENT EDEMA, LEGS 11/03/2006   Morbid obesity ([place]) 07/15/2006   Dysthymic disorder 07/15/2006   MIGRAINE HEADACHE 07/15/2006   RETINITIS PIGMENTOSA 07/15/2006   VARICOSE VEINS, LOWER EXTREMITIES 07/15/2006   ALLERGIC RHINITIS 07/15/2006   ASTHMA 07/15/2006   GERD 07/15/2006   BACK PAIN, LUMBAR, CHRONIC 07/15/2006   INCONTINENCE, URGE 07/15/2006   Asthma 07/15/2006   Retinitis pigmentosa 07/15/2006  Past Medical History:  Diagnosis Date   Allergy    allergic rhinitis   Anemia    Anxiety    ATTACKS   Arthritis    Asthma    Cataract    left cataract removal 12/2008   Chronic back pain    OA of spine   Depression    GERD (gastroesophageal reflux disease)    Endo negative 02/2000   Hypertension 03/2008   IBS (irritable bowel syndrome)    MRSA infection 2008   Right leg   Obesity    Ovarian cyst    Retinitis pigmentosa    blind   Past Surgical History:  Procedure Laterality  Date   APPENDECTOMY     BLADDER SURGERY  1968   age 50 ? dilitation   CATARACT EXTRACTION W/PHACO Right 11/11/2014   Procedure: CATARACT EXTRACTION PHACO AND INTRAOCULAR LENS PLACEMENT (IOC);  Surgeon: Sallee Lange, MD;  Location: [facility];  Service: Ophthalmology;  Laterality: Right;  LOT PACK: [id number] H [place]: 00:29.7 AP: 11.2 CDE: 7.14   CESAREAN SECTION     CHOLECYSTECTOMY     CYSTOSCOPY  03/2008   negative// Dr. Wanda Plump urologist   ENDOMETRIAL BIOPSY  07/1998   negative   EYE SURGERY  12/2008   cataract removal left   HERNIA REPAIR  05/2006   ruptured hernia repair after fall and then two more surgeries within as many weeks for complications(05/2006) Umbilical hernia repair (06/1997)   KNEE ARTHROSCOPY W/ MENISCAL REPAIR     left knne   Social History   Tobacco Use   Smoking status: Never   Smokeless tobacco: Never  Vaping Use   Vaping Use: Never used  Substance Use Topics   Alcohol use: Yes    Comment: twice a year   Drug use: Yes    Types: Marijuana   Family History  Problem Relation Age of Onset   Depression Mother    Heart disease Father    Diabetes Maternal Uncle    Hypertension Maternal Grandmother    Heart disease Paternal Grandmother    Heart disease Paternal Grandfather    Depression Brother        Depression secondary to MVA injuries   Hypertension Brother    Breast cancer Neg Hx    Allergies  Allergen Reactions   Diclofenac Sodium     REACTION: allergic/ made asthma bad   Etodolac     REACTION: GI   Metronidazole     REACTION: GI side eff   Nsaids Other (See Comments)    GI Upset   Oxybutynin Itching   Rosuvastatin     Myalgias    Sulfamethoxazole-Trimethoprim    Tolterodine Tartrate     REACTION: None Effective   Tramadol Other (See Comments)    Caused elevated BP   Sulfa Antibiotics Itching and Rash   Current Outpatient Medications on File Prior to Visit  Medication Sig Dispense Refill   Albuterol Sulfate (PROAIR HFA IN)  Inhale 2 puffs into the lungs every 6 (six) hours as needed.     ALLERGY RELIEF 180 MG tablet TAKE 1 TABLET BY MOUTH EVERY DAY 90 tablet 1   ALPRAZolam (XANAX) 1 MG tablet TAKE 1 TABLET(1 MG) BY MOUTH THREE TIMES DAILY AS NEEDED 90 tablet 3   Ascorbic Acid (VITAMIN C) 1000 MG tablet Take 1,000 mg by mouth daily.     Black Elderberry,Berry-Flower, 575 MG CAPS Take 1 capsule by mouth daily.     Calcium Carbonate-Vitamin D 600-400 MG-UNIT tablet  Take 1 tablet by mouth daily.     Cholecalciferol (VITAMIN D3 PO) Take 1,000 Units by mouth daily.     cyclobenzaprine (FLEXERIL) 10 MG tablet Take 1 tablet (10 mg total) by mouth 3 (three) times daily as needed for muscle spasms. Caution of sedation 30 tablet 3   fluticasone (FLONASE) 50 MCG/ACT nasal spray SHAKE LIQUID AND USE 2 SPRAYS IN EACH NOSTRIL TWICE DAILY 48 g 1   lisinopril (ZESTRIL) 5 MG tablet TAKE 1 TABLET(5 MG) BY MOUTH EVERY MORNING 90 tablet 2   montelukast (SINGULAIR) 10 MG tablet TAKE 1 TABLET BY MOUTH EVERY DAY 90 tablet 2   Omega 3-6-9 Fatty Acids (OMEGA 3-6-9 COMPLEX PO) Take one tablet by mouth daily     omeprazole (PRILOSEC) 40 MG capsule TAKE 1 CAPSULE BY MOUTH EVERY DAY 90 capsule 2   OVER THE COUNTER MEDICATION daily as needed. HYLAND'S CELL SALTS #8 MAGNESIA PHOSPHORICA     sertraline (ZOLOFT) 50 MG tablet TAKE 1 TABLET(50 MG) BY MOUTH DAILY 90 tablet 2   tolterodine (DETROL LA) 4 MG 24 hr capsule TAKE 1 CAPSULE(4 MG) BY MOUTH DAILY AS NEEDED 90 capsule 0   No current facility-administered medications on file prior to visit.   Review of Systems  Constitutional:  Negative for chills, fever and malaise/fatigue.  HENT:  Negative for congestion, ear pain, sinus pain and sore throat.   Eyes:  Negative for blurred vision, discharge and redness.  Respiratory:  Negative for cough, shortness of breath and stridor.   Cardiovascular:  Negative for chest pain, palpitations and leg swelling.  Gastrointestinal:  Negative for abdominal pain,  diarrhea, nausea and vomiting.  Musculoskeletal:  Positive for back pain and joint pain. Negative for myalgias.  Skin:  Negative for rash.  Neurological:  Negative for dizziness and headaches.  Psychiatric/Behavioral:  The patient is nervous/anxious.     Observations/Objective: Pt sounds well Like usual self  Not distressed Voices frustration and anxiety over her health but mood is generally good Talkative  Nl cognition/good historian  Assessment and Plan: Problem List Items Addressed This Visit       Other   Chronic pain    Knee/shoulders back  Continues norco Ortho f/u   Needs knee repl and trying to loose wt to be a candidate for that       Relevant Medications   HYDROcodone-acetaminophen ([place]) 5-325 MG tablet   Encounter for chronic pain management - Primary    Doing well with norco 5-325 mg up to every 6 h prn for knee and shoulder arthritis pain #90 per month  No side effects UDS and contract review on 05/21/21, up to date with low risk drug screen NCCSRS reviewed with no ref flags Refills done to fill on /after 1/26, 2/26 and 04/13/22 Continues orthopedic care also         Follow Up Instructions: Continue current medicines Be as active as you can be  Keep working on weight loss  No change in medicines  Continue orthopedic follow up   I discussed the assessment and treatment plan with the patient. The patient was provided an opportunity to ask questions and all were answered. The patient agreed with the plan and demonstrated an understanding of the instructions.   The patient was advised to call back or seek an in-person evaluation if the symptoms worsen or if the condition fails to improve as anticipated.  I provided 17 minutes of non-face-to-face time during this encounter.   Loura Pardon, MD

## 2022-02-11 NOTE — Assessment & Plan Note (Signed)
Knee/shoulders back  Continues norco Ortho f/u   Needs knee repl and trying to loose wt to be a candidate for that

## 2022-02-11 NOTE — Assessment & Plan Note (Signed)
Doing well with norco 5-325 mg up to every 6 h prn for knee and shoulder arthritis pain #90 per month  No side effects UDS and contract review on 05/21/21, up to date with low risk drug screen NCCSRS reviewed with no ref flags Refills done to fill on /after 1/26, 2/26 and 04/13/22 Continues orthopedic care also

## 2022-02-23 ENCOUNTER — Ambulatory Visit
Admission: RE | Admit: 2022-02-23 | Discharge: 2022-02-23 | Disposition: A | Discharge status: Home / Self Care | Payer: PPO | Source: Ambulatory Visit | Attending: Advanced Practice Midwife | Admitting: Advanced Practice Midwife

## 2022-02-23 DIAGNOSIS — R92323 Mammographic fibroglandular density, bilateral breasts: Secondary | ICD-10-CM | POA: Diagnosis not present

## 2022-02-23 DIAGNOSIS — Z01419 Encounter for gynecological examination (general) (routine) without abnormal findings: Secondary | ICD-10-CM | POA: Diagnosis not present

## 2022-02-23 DIAGNOSIS — Z1231 Encounter for screening mammogram for malignant neoplasm of breast: Secondary | ICD-10-CM | POA: Insufficient documentation

## 2022-03-09 ENCOUNTER — Telehealth: Payer: Self-pay | Admitting: Family Medicine

## 2022-03-09 DIAGNOSIS — E7800 Pure hypercholesterolemia, unspecified: Secondary | ICD-10-CM

## 2022-03-09 DIAGNOSIS — R7309 Other abnormal glucose: Secondary | ICD-10-CM

## 2022-03-09 DIAGNOSIS — I10 Essential (primary) hypertension: Secondary | ICD-10-CM

## 2022-03-09 NOTE — Telephone Encounter (Signed)
-----   Message from Velna Hatchet, RT sent at 02/22/2022 11:24 AM EST ----- Regarding: Wed 2/21 lab Patient is scheduled for cpx, please order future labs.  Thanks, Anda Kraft

## 2022-03-10 ENCOUNTER — Other Ambulatory Visit (INDEPENDENT_AMBULATORY_CARE_PROVIDER_SITE_OTHER): Payer: PPO

## 2022-03-10 DIAGNOSIS — R7309 Other abnormal glucose: Secondary | ICD-10-CM

## 2022-03-10 DIAGNOSIS — I10 Essential (primary) hypertension: Secondary | ICD-10-CM

## 2022-03-10 DIAGNOSIS — E7800 Pure hypercholesterolemia, unspecified: Secondary | ICD-10-CM | POA: Diagnosis not present

## 2022-03-10 LAB — COMPREHENSIVE METABOLIC PANEL
ALT: 17 U/L (ref 0–35)
AST: 14 U/L (ref 0–37)
Albumin: 4.3 g/dL (ref 3.5–5.2)
Alkaline Phosphatase: 67 U/L (ref 39–117)
BUN: 16 mg/dL (ref 6–23)
CO2: 29 mEq/L (ref 19–32)
Calcium: 10 mg/dL (ref 8.4–10.5)
Chloride: 101 mEq/L (ref 96–112)
Creatinine, Ser: 0.73 mg/dL (ref 0.40–1.20)
GFR: 89.87 mL/min (ref >60.00)
Glucose, Bld: 110 mg/dL — ABNORMAL HIGH (ref 70–99)
Potassium: 4.1 mEq/L (ref 3.5–5.1)
Sodium: 141 mEq/L (ref 135–145)
Total Bilirubin: 0.5 mg/dL (ref 0.2–1.2)
Total Protein: 6.9 g/dL (ref 6.0–8.3)

## 2022-03-10 LAB — CBC WITH DIFFERENTIAL/PLATELET
Basophils Absolute: 0 10*3/uL (ref 0.0–0.1)
Basophils Relative: 0.6 % (ref 0.0–3.0)
Eosinophils Absolute: 0.3 10*3/uL (ref 0.0–0.7)
Eosinophils Relative: 3.6 % (ref 0.0–5.0)
HCT: 40.3 % (ref 36.0–46.0)
Hemoglobin: 13.2 g/dL (ref 12.0–15.0)
Lymphocytes Relative: 24.8 % (ref 12.0–46.0)
Lymphs Abs: 1.9 10*3/uL (ref 0.7–4.0)
MCHC: 32.8 g/dL (ref 30.0–36.0)
MCV: 86.8 fl (ref 78.0–100.0)
Monocytes Absolute: 0.6 10*3/uL (ref 0.1–1.0)
Monocytes Relative: 7.6 % (ref 3.0–12.0)
Neutro Abs: 4.8 10*3/uL (ref 1.4–7.7)
Neutrophils Relative %: 63.4 % (ref 43.0–77.0)
Platelets: 291 10*3/uL (ref 150.0–400.0)
RBC: 4.64 Mil/uL (ref 3.87–5.11)
RDW: 14.5 % (ref 11.5–15.5)
WBC: 7.6 10*3/uL (ref 4.0–10.5)

## 2022-03-10 LAB — LIPID PANEL
Cholesterol: 214 mg/dL — ABNORMAL HIGH (ref 0–200)
HDL: 46.2 mg/dL (ref >39.00)
LDL Cholesterol: 133 mg/dL — ABNORMAL HIGH (ref 0–99)
NonHDL: 167.86
Total CHOL/HDL Ratio: 5
Triglycerides: 176 mg/dL — ABNORMAL HIGH (ref 0.0–149.0)
VLDL: 35.2 mg/dL (ref 0.0–40.0)

## 2022-03-10 LAB — HEMOGLOBIN A1C: Hgb A1c MFr Bld: 5.9 % (ref 4.6–6.5)

## 2022-03-10 LAB — TSH: TSH: 0.97 u[IU]/mL (ref 0.35–5.50)

## 2022-03-15 ENCOUNTER — Other Ambulatory Visit: Payer: Self-pay | Admitting: Family Medicine

## 2022-03-15 NOTE — Telephone Encounter (Signed)
Please verify that she is still taking it

## 2022-03-15 NOTE — Telephone Encounter (Signed)
Last reviewed #90 on 12/22/20. Should patient still be taking?

## 2022-03-17 ENCOUNTER — Encounter: Payer: Self-pay | Admitting: Family Medicine

## 2022-03-17 ENCOUNTER — Ambulatory Visit (INDEPENDENT_AMBULATORY_CARE_PROVIDER_SITE_OTHER): Payer: PPO | Admitting: Family Medicine

## 2022-03-17 VITALS — BP 130/68 | HR 102 | Temp 97.8°F | Ht 60.0 in | Wt 297.4 lb

## 2022-03-17 DIAGNOSIS — Z1211 Encounter for screening for malignant neoplasm of colon: Secondary | ICD-10-CM | POA: Diagnosis not present

## 2022-03-17 DIAGNOSIS — R7309 Other abnormal glucose: Secondary | ICD-10-CM | POA: Diagnosis not present

## 2022-03-17 DIAGNOSIS — E7800 Pure hypercholesterolemia, unspecified: Secondary | ICD-10-CM | POA: Diagnosis not present

## 2022-03-17 DIAGNOSIS — Z0000 Encounter for general adult medical examination without abnormal findings: Secondary | ICD-10-CM

## 2022-03-17 DIAGNOSIS — K219 Gastro-esophageal reflux disease without esophagitis: Secondary | ICD-10-CM

## 2022-03-17 DIAGNOSIS — F341 Dysthymic disorder: Secondary | ICD-10-CM

## 2022-03-17 DIAGNOSIS — J4520 Mild intermittent asthma, uncomplicated: Secondary | ICD-10-CM

## 2022-03-17 DIAGNOSIS — I10 Essential (primary) hypertension: Secondary | ICD-10-CM | POA: Diagnosis not present

## 2022-03-17 DIAGNOSIS — N912 Amenorrhea, unspecified: Secondary | ICD-10-CM | POA: Diagnosis not present

## 2022-03-17 MED ORDER — EZETIMIBE 10 MG PO TABS: 10.0000 mg | ORAL_TABLET | Freq: Every day | ORAL | 3 refills | Status: DC

## 2022-03-17 MED ORDER — TOLTERODINE TARTRATE ER 4 MG PO CP24: ORAL_CAPSULE | ORAL | 3 refills | Status: DC

## 2022-03-17 NOTE — Assessment & Plan Note (Signed)
Doing well with mood Continues sertraline and xanax prn PHQ of 1

## 2022-03-17 NOTE — Assessment & Plan Note (Addendum)
Suspect menopausal  Sees gyn yearly

## 2022-03-17 NOTE — Assessment & Plan Note (Signed)
Fairly good control with omeprazole 40 mg daily  Working on wt loss and better diet also

## 2022-03-17 NOTE — Assessment & Plan Note (Signed)
Lab Results  Component Value Date   HGBA1C 5.9 03/10/2022   This is stable disc imp of low glycemic diet and wt loss to prevent DM2

## 2022-03-17 NOTE — Assessment & Plan Note (Signed)
Reviewed health habits including diet and exercise and skin cancer prevention Reviewed appropriate screening tests for age  Also reviewed health mt list, fam hx and immunization status , as well as social and family history   See HPI Labs rev  Transportation insecure and some financial limitations / legally blind Offered SW ref-pt declines for now but may consider later Plans to get shingrix at pharmacy  Mammogram utd 02/2022 Sees gyn and pap utd  Cologuard neg 03/2021  Mood is stable Working on wt loss

## 2022-03-17 NOTE — Assessment & Plan Note (Signed)
Disc goals for lipids and reasons to control them Rev last labs with pt Rev low sat fat diet in detail  LDL up off statin (did not tolerate crestor due to muscle pain) Will try zetia 10 mg daily  Handout given  Re check lab at f/u in late April

## 2022-03-17 NOTE — Assessment & Plan Note (Signed)
bp in fair control at this time  BP Readings from Last 1 Encounters:  03/17/22 130/68   No changes needed Most recent labs reviewed  Disc lifstyle change with low sodium diet and exercise  Plan to continue lisinopril 5 mg daily

## 2022-03-17 NOTE — Assessment & Plan Note (Signed)
Discussed how this problem influences overall health and the risks it imposes  Reviewed plan for weight loss with lower calorie diet (via better food choices and also portion control or program like weight watchers) and exercise building up to or more than 30 minutes 5 days per week including some aerobic activity   Limited exercise due to OA   Significant wt loss since nov- 17 lb Urged to keep working on it

## 2022-03-17 NOTE — Assessment & Plan Note (Signed)
Cologuard negative 03/2021  This is good for 3 years

## 2022-03-17 NOTE — Patient Instructions (Addendum)
Think about some light weights or exercise bands to work on upper body strength   If you are interested in the new shingles vaccine (Shingrix) - call your local pharmacy to check on coverage and availability  If affordable, get on a wait list at your pharmacy to get the vaccine.  Let me know if you want me to look into help with transportation    Try zetia 10 mg daily for cholesterol  We will do labs at your next appt in late April

## 2022-03-17 NOTE — Progress Notes (Signed)
Subjective:    Patient ID: Monica Madden, female    DOB: 10/06/1960, 62 y.o.   MRN: [id number]  HPI Here for health maintenance exam and to review chronic medical problems    Wt Readings from Last 3 Encounters:  03/17/22 297 lb 6 oz (134.9 kg)  12/09/21 (!) 314 lb (142.4 kg)  11/11/21 (!) 309 lb (140.2 kg)   58.08 kg/m  Thrilled with wt loss Eating less  (now getting full quicker)  Better breakfast/oatmeal  Quit chips / more veg and fruit  Drinks water all day   Still walks in house despite knee issues  Some stretching   Daughter is moving out for job / also Market researcher (not moving far)  Will still do shopping for her  It will be hard on her but happy for her   Husband has bad back problems and is not very mobile   She is very careful not to fall  Has a large support system     Immunization History  Administered Date(s) Administered   Influenza Inj Mdck Quad Pf 10/21/2015, 10/19/2017, 10/10/2020   Influenza Split 01/01/2011   Influenza,inj,Quad PF,6+ Mos 10/30/2014, 10/19/2017, 12/01/2018, 12/03/2019, 10/20/2021   Influenza-Unspecified 10/27/2012, 10/21/2015, 10/19/2016   Moderna Covid-19 Vaccine Bivalent Booster 21yr & up 10/10/2020   Moderna Sars-Covid-2 Vaccination 04/11/2019, 05/15/2019, 11/27/2019   PFIZER Comirnaty(Gray Top)Covid-19 Tri-Sucrose Vaccine 10/20/2021   Pneumococcal Polysaccharide-23 06/30/2011, 12/01/2018   Td 07/28/2004   Tdap 10/30/2014   Health Maintenance Due  Topic Date Due   Zoster Vaccines- Shingrix (1 of 2) Never done   Shingrix: plans to get at pharmacy  Needs to get transportation   Mammogram 02/2022 Self breast exam no lumps  Last gyn visit 05/2021   Pap 05/2021 at gyn [facility] with neg HPV  Colonoscopy 09/2009  Cologuard neg 03/2021   Mood    12/09/2021   11:21 AM 12/03/2020   11:24 AM 12/03/2019   11:18 AM 12/01/2018   10:51 AM 12/01/2018   10:27 AM  Depression screen PHQ 2/9  Decreased Interest 0 0 0 0 0  Down,  Depressed, Hopeless 1 0 0 0 0  PHQ - 2 Score 1 0 0 0 0  Altered sleeping 0   1   Tired, decreased energy 0   0   Change in appetite 0   0   Feeling bad or failure about yourself  0   0   Trouble concentrating 0   0   Moving slowly or fidgety/restless 0   0   Suicidal thoughts 0   0   PHQ-9 Score 1   1   Difficult doing work/chores Not difficult at all   Not difficult at all     Zoloft 50 mg daily - has a good outlook  Xanax prn   HTN bp is stable today  No cp or palpitations or headaches or edema  No side effects to medicines  BP Readings from Last 3 Encounters:  03/17/22 130/68  05/21/21 139/83  04/08/21 136/74    Pulse Readings from Last 3 Encounters:  03/17/22 (!) 102  05/21/21 83  04/08/21 79    Lisinopril 5 mg daily   Lab Results  Component Value Date   CREATININE 0.73 03/10/2022   BUN 16 03/10/2022   NA 141 03/10/2022   K 4.1 03/10/2022   CL 101 03/10/2022   CO2 29 03/10/2022   GFR 89.8  H/o asthma Uses singulair 10 mg daily   GERD Omeprazole  40 mg daily  No results found for: "VITAMINB12"   Elevated glucose level in past Lab Results  Component Value Date   HGBA1C 5.9 03/10/2022   This is stable  Hyperlipidemia Lab Results  Component Value Date   CHOL 214 (H) 03/10/2022   CHOL 160 03/10/2021   CHOL 217 (H) 11/26/2019   Lab Results  Component Value Date   HDL 46.20 03/10/2022   HDL 46.60 03/10/2021   HDL 44.60 11/26/2019   Lab Results  Component Value Date   LDLCALC 133 (H) 03/10/2022   LDLCALC 75 03/10/2021   LDLCALC 137 (H) 11/26/2019   Lab Results  Component Value Date   TRIG 176.0 (H) 03/10/2022   TRIG 188.0 (H) 03/10/2021   TRIG 177.0 (H) 11/26/2019   Lab Results  Component Value Date   CHOLHDL 5 03/10/2022   CHOLHDL 3 03/10/2021   CHOLHDL 5 11/26/2019   Lab Results  Component Value Date   LDLDIRECT 140.0 11/24/2018   LDLDIRECT 135.0 11/22/2017   LDLDIRECT 139.0 11/11/2015   Crestor 5 mg daily in the past- had  to stop it for myalgias   Urinary incontinence Takes detrol LA 4 mg daily   In the past could not take oxybutinin - it caused itching/rash / was allergic   Other labs  Lab Results  Component Value Date   WBC 7.6 03/10/2022   HGB 13.2 03/10/2022   HCT 40.3 03/10/2022   MCV 86.8 03/10/2022   PLT 291.0 03/10/2022   Lab Results  Component Value Date   TSH 0.97 03/10/2022   Lab Results  Component Value Date   ALT 17 03/10/2022   AST 14 03/10/2022   ALKPHOS 67 03/10/2022   BILITOT 0.5 03/10/2022    Patient Active Problem List   Diagnosis Date Noted   Colon cancer screening 03/17/2021   Paresthesia of both hands 08/06/2020   Medicare annual wellness visit, subsequent 12/01/2018   Encounter for chronic pain management 02/07/2017   Chronic pain 11/17/2015   Elevated glucose level 11/09/2015   Mild intermittent asthma without complication [id number]   Osteoarthritis of knee 07/18/2013   Routine general medical examination at a health care facility 06/30/2011   Amenorrhea 06/30/2011   IRRITABLE BOWEL SYNDROME 08/07/2009   KNEE PAIN, BILATERAL 08/07/2009   Pain in joint involving lower leg 08/07/2009   CONSTIPATION 01/15/2009   LEG PAIN, LEFT 10/09/2008   Hyperlipidemia 05/09/2008   Essential hypertension 03/29/2008   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 06/05/2007   INCI HERNIA WITHOUT MENTION OBSTRUCTION/GANGRENE 05/12/2007   FREQUENCY, URINARY 05/12/2007   DEPENDENT EDEMA, LEGS 11/03/2006   Morbid obesity ([place]) 07/15/2006   Dysthymic disorder 07/15/2006   MIGRAINE HEADACHE 07/15/2006   RETINITIS PIGMENTOSA 07/15/2006   VARICOSE VEINS, LOWER EXTREMITIES 07/15/2006   ALLERGIC RHINITIS 07/15/2006   GERD 07/15/2006   BACK PAIN, LUMBAR, CHRONIC 07/15/2006   INCONTINENCE, URGE 07/15/2006   Asthma 07/15/2006   Retinitis pigmentosa 07/15/2006   Past Medical History:  Diagnosis Date   Allergy    allergic rhinitis   Anemia    Anxiety    ATTACKS    Arthritis    Asthma    Cataract    left cataract removal 12/2008   Chronic back pain    OA of spine   Depression    GERD (gastroesophageal reflux disease)    Endo negative 02/2000   Hypertension 03/2008   IBS (irritable bowel syndrome)    MRSA infection 2008   Right leg  Obesity    Ovarian cyst    Retinitis pigmentosa    blind   Past Surgical History:  Procedure Laterality Date   APPENDECTOMY     BLADDER SURGERY  1968   age 58 ? dilitation   CATARACT EXTRACTION W/PHACO Right 11/11/2014   Procedure: CATARACT EXTRACTION PHACO AND INTRAOCULAR LENS PLACEMENT ([place]);  Surgeon: Estill Cotta, MD;  Location: [facility] OR[facility];  Service: Ophthalmology;  Laterality: Right;  LOT PACK: [id number] H [place]: 00:29.7 AP: 11.2 CDE: 7.14   CESAREAN SECTION     CHOLECYSTECTOMY     CYSTOSCOPY  03/2008   negative// Dr. Reece Agar urologist   ENDOMETRIAL BIOPSY  07/1998   negative   EYE SURGERY  12/2008   cataract removal left   HERNIA REPAIR  05/2006   ruptured hernia repair after fall and then two more surgeries within as many weeks for [id number]) Umbilical hernia repair (06/1997)   KNEE ARTHROSCOPY W/ MENISCAL REPAIR     left knne   Social History   Tobacco Use   Smoking status: Never   Smokeless tobacco: Never  Vaping Use   Vaping Use: Never used  Substance Use Topics   Alcohol use: Yes    Comment: twice a year   Drug use: Yes    Types: Marijuana   Family History  Problem Relation Age of Onset   Depression Mother    Heart disease Father    Diabetes Maternal Uncle    Hypertension Maternal Grandmother    Heart disease Paternal Grandmother    Heart disease Paternal Grandfather    Depression Brother        Depression secondary to MVA injuries   Hypertension Brother    Breast cancer Neg Hx    Allergies  Allergen Reactions   Diclofenac Sodium     REACTION: allergic/ made asthma bad   Etodolac     REACTION: GI   Metronidazole     REACTION: GI side eff   Nsaids Other  (See Comments)    GI Upset   Oxybutynin Itching   Rosuvastatin     Myalgias    Sulfamethoxazole-Trimethoprim    Tramadol Other (See Comments)    Caused elevated BP   Sulfa Antibiotics Itching and Rash   Current Outpatient Medications on File Prior to Visit  Medication Sig Dispense Refill   Albuterol Sulfate (PROAIR HFA IN) Inhale 2 puffs into the lungs every 6 (six) hours as needed.     ALLERGY RELIEF 180 MG tablet TAKE 1 TABLET BY MOUTH EVERY DAY 90 tablet 1   ALPRAZolam (XANAX) 1 MG tablet TAKE 1 TABLET(1 MG) BY MOUTH THREE TIMES DAILY AS NEEDED 90 tablet 3   Ascorbic Acid (VITAMIN C) 1000 MG tablet Take 1,000 mg by mouth daily.  Vitamin C with Rosehips     Bee Pollen 500 MG CHEW Chew 500 mg by mouth in the morning and at bedtime.     BLACK ELDERBERRY PO Take 1,150 mg by mouth daily.     Calcium Carbonate-Vitamin D 600-400 MG-UNIT tablet Take 1 tablet by mouth daily.     Coenzyme Q10 (CO Q 10) 100 MG CAPS Take 1 Capful by mouth daily.     Cranberry-Vitamin C-Probiotic (AZO CRANBERRY PO) Take 500 mg by mouth every other day.     fluticasone (FLONASE) 50 MCG/ACT nasal spray SHAKE LIQUID AND USE 2 SPRAYS IN EACH NOSTRIL TWICE DAILY 48 g 1   HYDROcodone-acetaminophen (NORCO) 5-325 MG tablet Take 1 tablet by mouth every 6 (six) hours  as needed for moderate pain or severe pain. Do not fill before  04/13/22 90 tablet 0   lisinopril (ZESTRIL) 5 MG tablet TAKE 1 TABLET(5 MG) BY MOUTH EVERY MORNING 90 tablet 2   montelukast (SINGULAIR) 10 MG tablet TAKE 1 TABLET BY MOUTH EVERY DAY 90 tablet 2   Omega 3-6-9 Fatty Acids (OMEGA 3-6-9 COMPLEX PO) Take one tablet by mouth daily     omeprazole (PRILOSEC) 40 MG capsule TAKE 1 CAPSULE BY MOUTH EVERY DAY 90 capsule 2   OVER THE COUNTER MEDICATION daily as needed. HYLAND'S CELL SALTS #8 MAGNESIA PHOSPHORICA     OVER THE COUNTER MEDICATION Take 10 mg by mouth daily. VITAL PROTEINS COLLAGEN PEPTIDES     sertraline (ZOLOFT) 50 MG tablet TAKE 1 TABLET(50 MG)  BY MOUTH DAILY 90 tablet 2   No current facility-administered medications on file prior to visit.      Review of Systems  Constitutional:  Negative for activity change, appetite change, fatigue, fever and unexpected weight change.  HENT:  Negative for congestion, ear pain, rhinorrhea, sinus pressure and sore throat.   Eyes:  Positive for visual disturbance. Negative for pain and redness.       Legally blind   Respiratory:  Negative for cough, shortness of breath and wheezing.   Cardiovascular:  Negative for chest pain and palpitations.  Gastrointestinal:  Negative for abdominal pain, blood in stool, constipation and diarrhea.  Endocrine: Negative for polydipsia and polyuria.  Genitourinary:  Negative for dysuria, frequency and urgency.  Musculoskeletal:  Positive for arthralgias and back pain. Negative for myalgias.  Skin:  Negative for pallor and rash.  Allergic/Immunologic: Negative for environmental allergies.  Neurological:  Negative for dizziness, syncope and headaches.  Hematological:  Negative for adenopathy. Does not bruise/bleed easily.  Psychiatric/Behavioral:  Positive for dysphoric mood. Negative for decreased concentration. The patient is nervous/anxious.        Objective:   Physical Exam Constitutional:      General: She is not in acute distress.    Appearance: Normal appearance. She is well-developed. She is obese. She is not ill-appearing or diaphoretic.  HENT:     Head: Normocephalic and atraumatic.     Right Ear: Tympanic membrane, ear canal and external ear normal.     Left Ear: Tympanic membrane, ear canal and external ear normal.     Nose: Nose normal. No congestion.     Mouth/Throat:     Mouth: Mucous membranes are moist.     Pharynx: Oropharynx is clear. No posterior oropharyngeal erythema.  Eyes:     General: No scleral icterus.    Extraocular Movements: Extraocular movements intact.     Conjunctiva/sclera: Conjunctivae normal.     Pupils: Pupils are  equal, round, and reactive to light.  Neck:     Thyroid: No thyromegaly.     Vascular: No carotid bruit or JVD.  Cardiovascular:     Rate and Rhythm: Normal rate and regular rhythm.     Pulses: Normal pulses.     Heart sounds: Normal heart sounds.     No gallop.  Pulmonary:     Effort: Pulmonary effort is normal. No respiratory distress.     Breath sounds: Normal breath sounds. No wheezing.     Comments: Good air exch Chest:     Chest wall: No tenderness.  Abdominal:     General: Bowel sounds are normal. There is no distension or abdominal bruit.     Palpations: Abdomen is soft. There is  no mass.     Tenderness: There is no abdominal tenderness.     Hernia: No hernia is present.  Genitourinary:    Comments: Breast exam: No mass, nodules, thickening, tenderness, bulging, retraction, inflamation, nipple discharge or skin changes noted.  No axillary or clavicular LA.     Musculoskeletal:        General: No tenderness. Normal range of motion.     Cervical back: Normal range of motion and neck supple. No rigidity. No muscular tenderness.     Right lower leg: No edema.     Left lower leg: No edema.     Comments: No kyphosis   Lymphadenopathy:     Cervical: No cervical adenopathy.  Skin:    General: Skin is warm and dry.     Coloration: Skin is not pale.     Findings: No erythema or rash.     Comments: Solar lentigines diffusely   Neurological:     Mental Status: She is alert. Mental status is at baseline.     Cranial Nerves: No cranial nerve deficit.     Motor: No abnormal muscle tone.     Coordination: Coordination normal.     Gait: Gait normal.     Deep Tendon Reflexes: Reflexes are normal and symmetric. Reflexes normal.  Psychiatric:        Mood and Affect: Mood normal.        Cognition and Memory: Cognition and memory normal.           Assessment & Plan:   Problem List Items Addressed This Visit       Cardiovascular and Mediastinum   Essential hypertension     bp in fair control at this time  BP Readings from Last 1 Encounters:  03/17/22 130/68  No changes needed Most recent labs reviewed  Disc lifstyle change with low sodium diet and exercise  Plan to continue lisinopril 5 mg daily      Relevant Medications   ezetimibe (ZETIA) 10 MG tablet     Respiratory   Asthma    Does well with Singulair 10 mg daily        Digestive   GERD    Fairly good control with omeprazole 40 mg daily  Working on wt loss and better diet also        Other   Amenorrhea    Suspect menopausal  Sees gyn yearly      Colon cancer screening    Cologuard negative 03/2021  This is good for 3 years       Dysthymic disorder    Doing well with mood Continues sertraline and xanax prn PHQ of 1      Elevated glucose level    Lab Results  Component Value Date   HGBA1C 5.9 03/10/2022  This is stable disc imp of low glycemic diet and wt loss to prevent DM2       Hyperlipidemia    Disc goals for lipids and reasons to control them Rev last labs with pt Rev low sat fat diet in detail  LDL up off statin (did not tolerate crestor due to muscle pain) Will try zetia 10 mg daily  Handout given  Re check lab at f/u in late April       Relevant Medications   ezetimibe (ZETIA) 10 MG tablet   Morbid obesity ([place])    Discussed how this problem influences overall health and the risks it imposes  Reviewed plan for weight loss  with lower calorie diet (via better food choices and also portion control or program like weight watchers) and exercise building up to or more than 30 minutes 5 days per week including some aerobic activity   Limited exercise due to OA   Significant wt loss since nov- 17 lb Urged to keep working on it       Routine general medical examination at a health care facility - Primary    Reviewed health habits including diet and exercise and skin cancer prevention Reviewed appropriate screening tests for age  Also reviewed health mt list,  fam hx and immunization status , as well as social and family history   See HPI Labs rev  Transportation insecure and some financial limitations / legally blind Offered SW ref-pt declines for now but may consider later Plans to get shingrix at pharmacy  Mammogram utd 02/2022 Sees gyn and pap utd  Cologuard neg 03/2021  Mood is stable Working on wt loss

## 2022-03-17 NOTE — Assessment & Plan Note (Signed)
Does well with Singulair 10 mg daily

## 2022-04-07 DIAGNOSIS — M1712 Unilateral primary osteoarthritis, left knee: Secondary | ICD-10-CM | POA: Diagnosis not present

## 2022-04-07 DIAGNOSIS — M19011 Primary osteoarthritis, right shoulder: Secondary | ICD-10-CM | POA: Diagnosis not present

## 2022-04-07 DIAGNOSIS — M7581 Other shoulder lesions, right shoulder: Secondary | ICD-10-CM | POA: Diagnosis not present

## 2022-04-12 ENCOUNTER — Other Ambulatory Visit: Payer: Self-pay | Admitting: Family Medicine

## 2022-04-13 NOTE — Telephone Encounter (Signed)
Name of Medication: Xanax Name of Pharmacy: [facility] [address] Last Fill or Written Date and Quantity: 12/15/21 #90 tabs/ 3 refills Last Office Visit and Type: CPE on 03/17/22 Next Office Visit and Type: f/u 05/12/22 Last Controlled Substance Agreement Date: 05/21/21 Last UDS:05/21/21

## 2022-04-14 ENCOUNTER — Other Ambulatory Visit: Payer: Self-pay | Admitting: Family Medicine

## 2022-05-12 ENCOUNTER — Encounter: Payer: Self-pay | Admitting: Family Medicine

## 2022-05-12 ENCOUNTER — Ambulatory Visit (INDEPENDENT_AMBULATORY_CARE_PROVIDER_SITE_OTHER): Payer: PPO | Admitting: Family Medicine

## 2022-05-12 VITALS — BP 110/62 | HR 75 | Temp 97.8°F | Ht 60.0 in | Wt 286.5 lb

## 2022-05-12 DIAGNOSIS — E7800 Pure hypercholesterolemia, unspecified: Secondary | ICD-10-CM | POA: Diagnosis not present

## 2022-05-12 DIAGNOSIS — G8929 Other chronic pain: Secondary | ICD-10-CM

## 2022-05-12 DIAGNOSIS — M170 Bilateral primary osteoarthritis of knee: Secondary | ICD-10-CM

## 2022-05-12 LAB — ALT: ALT: 12 U/L (ref 0–35)

## 2022-05-12 LAB — LIPID PANEL
Cholesterol: 195 mg/dL (ref 0–200)
HDL: 44.7 mg/dL (ref >39.00)
NonHDL: 150.39
Total CHOL/HDL Ratio: 4
Triglycerides: 203 mg/dL — ABNORMAL HIGH (ref 0.0–149.0)
VLDL: 40.6 mg/dL — ABNORMAL HIGH (ref 0.0–40.0)

## 2022-05-12 LAB — LDL CHOLESTEROL, DIRECT: Direct LDL: 136 mg/dL

## 2022-05-12 LAB — AST: AST: 12 U/L (ref 0–37)

## 2022-05-12 MED ORDER — HYDROCODONE-ACETAMINOPHEN 5-325 MG PO TABS: 1.0000 | ORAL_TABLET | Freq: Four times a day (QID) | ORAL | 0 refills | Status: DC | PRN

## 2022-05-12 NOTE — Patient Instructions (Addendum)
Keep working with the 3 lb weights  You can work on bicep and tricep muscles without moving shoulders   We will refill the norco   Urine drug screen today   Keep working on weight loss  Cholesterol labs today   See you in 3 months

## 2022-05-12 NOTE — Assessment & Plan Note (Signed)
Disc goals for lipids and reasons to control them Rev last labs with pt Rev low sat fat diet in detail  Last visit we started zetia 10 mg  Tolerating well  Labs today  Eating better

## 2022-05-12 NOTE — Progress Notes (Signed)
Subjective:    Patient ID: Monica Madden, female    DOB: 04-04-1960, 62 y.o.   MRN: [id number]  HPI Pt presents for encounter for chronic pain management  UDS is due  Cholesterol - needs check on zetia    Wt Readings from Last 3 Encounters:  05/12/22 286 lb 8 oz (130 kg)  03/17/22 297 lb 6 oz (134.9 kg)  12/09/21 (!) 314 lb (142.4 kg)   55.95 kg/m  Vitals:   05/12/22 0930  BP: 110/62  Pulse: 75  Temp: 97.8 F (36.6 C)  SpO2: 96%   Doing ok overall   Tried collagen for her arthritis- helped some but expensive   Wt is doming down Hopes to be safe for knee surgery in the future  Knee/shoulder injection last time   Eating a lot healthier now and feels better  Avoiding the sweets    Indication for chronic opioid: osteoarthritis  Ortho visit was 3/20   Trying to use some 3 lb hand weights for exercise  Watching the shoulder   Medication and dose: norco 5-325 mg every 6 hours prn Last fill 04/13/22   # pills per month: 90    Last UDS date: 05/21/21 low risk  Opioid Treatment Agreement signed (Y/N): yes  Opioid Treatment Agreement last reviewed with patient:   05/21/21  [facility] reviewed this encounter (include red flags):  today    Stress Father has to have a heart valve surgery / no blockages Dealing with it ok  Husb has health problems also  Continues prn xanax    Taking zetia for cholesterol Tolerating well Diet is improved also  Due to check this   Patient Active Problem List   Diagnosis Date Noted   Colon cancer screening 03/17/2021   Paresthesia of both hands 08/06/2020   Medicare annual wellness visit, subsequent 12/01/2018   Encounter for chronic pain management 02/07/2017   Chronic pain 11/17/2015   Elevated glucose level 11/09/2015   Mild intermittent asthma without complication 10/21/2015   Osteoarthritis of knee 07/18/2013   Routine general medical examination at a health care facility 06/30/2011   Amenorrhea 06/30/2011   IRRITABLE  BOWEL SYNDROME 08/07/2009   KNEE PAIN, BILATERAL 08/07/2009   Pain in joint involving lower leg 08/07/2009   CONSTIPATION 01/15/2009   LEG PAIN, LEFT 10/09/2008   Hyperlipidemia 05/09/2008   Essential hypertension 03/29/2008   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 06/05/2007   INCI HERNIA WITHOUT MENTION OBSTRUCTION/GANGRENE 05/12/2007   FREQUENCY, URINARY 05/12/2007   DEPENDENT EDEMA, LEGS 11/03/2006   Morbid obesity 07/15/2006   Dysthymic disorder 07/15/2006   MIGRAINE HEADACHE 07/15/2006   RETINITIS PIGMENTOSA 07/15/2006   VARICOSE VEINS, LOWER EXTREMITIES 07/15/2006   ALLERGIC RHINITIS 07/15/2006   GERD 07/15/2006   BACK PAIN, LUMBAR, CHRONIC 07/15/2006   INCONTINENCE, URGE 07/15/2006   Asthma 07/15/2006   Retinitis pigmentosa 07/15/2006   Past Medical History:  Diagnosis Date   Allergy    allergic rhinitis   Anemia    Anxiety    ATTACKS   Arthritis    Asthma    Cataract    left cataract removal 12/2008   Chronic back pain    OA of spine   Depression    GERD (gastroesophageal reflux disease)    Endo negative 02/2000   Hypertension 03/2008   IBS (irritable bowel syndrome)    MRSA infection 2008   Right leg   Obesity    Ovarian cyst    Retinitis pigmentosa  blind   Past Surgical History:  Procedure Laterality Date   APPENDECTOMY     BLADDER SURGERY  1968   age 62 ? dilitation   CATARACT EXTRACTION W/PHACO Right 11/11/2014   Procedure: CATARACT EXTRACTION PHACO AND INTRAOCULAR LENS PLACEMENT (IOC);  Surgeon: Sallee Lange, MD;  Location: [facility] OR[facility];  Service: Ophthalmology;  Laterality: Right;  LOT PACK: [id number] H [place]: 00:29.7 AP: 11.2 CDE: 7.14   CESAREAN SECTION     CHOLECYSTECTOMY     CYSTOSCOPY  03/2008   negative// Dr. Wanda Plump urologist   ENDOMETRIAL BIOPSY  07/1998   negative   EYE SURGERY  12/2008   cataract removal left   HERNIA REPAIR  05/2006   ruptured hernia repair after fall and then two more surgeries within as many weeks  for complications(05/2006) Umbilical hernia repair (06/1997)   KNEE ARTHROSCOPY W/ MENISCAL REPAIR     left knne   Social History   Tobacco Use   Smoking status: Never   Smokeless tobacco: Never  Vaping Use   Vaping Use: Never used  Substance Use Topics   Alcohol use: Yes    Comment: twice a year   Drug use: Yes    Types: Marijuana   Family History  Problem Relation Age of Onset   Depression Mother    Heart disease Father    Diabetes Maternal Uncle    Hypertension Maternal Grandmother    Heart disease Paternal Grandmother    Heart disease Paternal Grandfather    Depression Brother        Depression secondary to MVA injuries   Hypertension Brother    Breast cancer Neg Hx    Allergies  Allergen Reactions   Diclofenac Sodium     REACTION: allergic/ made asthma bad   Etodolac     REACTION: GI   Metronidazole     REACTION: GI side eff   Nsaids Other (See Comments)    GI Upset   Oxybutynin Itching   Rosuvastatin     Myalgias    Sulfamethoxazole-Trimethoprim    Tramadol Other (See Comments)    Caused elevated BP   Sulfa Antibiotics Itching and Rash   Current Outpatient Medications on File Prior to Visit  Medication Sig Dispense Refill   Albuterol Sulfate (PROAIR HFA IN) Inhale 2 puffs into the lungs every 6 (six) hours as needed.     ALLERGY RELIEF 180 MG tablet TAKE 1 TABLET BY MOUTH EVERY DAY 90 tablet 1   ALPRAZolam (XANAX) 1 MG tablet TAKE 1 TABLET(1 MG) BY MOUTH THREE TIMES DAILY AS NEEDED 90 tablet 3   Ascorbic Acid (VITAMIN C) 1000 MG tablet Take 1,000 mg by mouth daily.  Vitamin C with Rosehips     Bee Pollen 500 MG CHEW Chew 500 mg by mouth in the morning and at bedtime.     BLACK ELDERBERRY PO Take 1,150 mg by mouth daily.     Calcium Carbonate-Vitamin D 600-400 MG-UNIT tablet Take 1 tablet by mouth daily.     Coenzyme Q10 (CO Q 10) 100 MG CAPS Take 1 Capful by mouth daily.     Cranberry-Vitamin C-Probiotic (AZO CRANBERRY PO) Take 500 mg by mouth every  other day.     ezetimibe (ZETIA) 10 MG tablet Take 1 tablet (10 mg total) by mouth daily. 90 tablet 3   fluticasone (FLONASE) 50 MCG/ACT nasal spray SHAKE LIQUID AND USE 2 SPRAYS IN EACH NOSTRIL TWICE DAILY 48 g 1   lisinopril (ZESTRIL) 5 MG tablet TAKE 1 TABLET(5  MG) BY MOUTH EVERY MORNING 90 tablet 2   montelukast (SINGULAIR) 10 MG tablet TAKE 1 TABLET BY MOUTH EVERY DAY 90 tablet 2   Omega 3-6-9 Fatty Acids (OMEGA 3-6-9 COMPLEX PO) Take one tablet by mouth daily     omeprazole (PRILOSEC) 40 MG capsule TAKE 1 CAPSULE BY MOUTH EVERY DAY 90 capsule 2   OVER THE COUNTER MEDICATION daily as needed. HYLAND'S CELL SALTS #8 MAGNESIA PHOSPHORICA     OVER THE COUNTER MEDICATION Take 10 mg by mouth daily. VITAL PROTEINS COLLAGEN PEPTIDES     sertraline (ZOLOFT) 50 MG tablet TAKE 1 TABLET(50 MG) BY MOUTH DAILY 90 tablet 2   tolterodine (DETROL LA) 4 MG 24 hr capsule TAKE 1 CAPSULE(4 MG) BY MOUTH DAILY AS NEEDED 90 capsule 3   No current facility-administered medications on file prior to visit.    Review of Systems  Constitutional:  Negative for activity change, appetite change, fatigue, fever and unexpected weight change.  HENT:  Negative for congestion, ear pain, rhinorrhea, sinus pressure and sore throat.   Eyes:  Positive for visual disturbance. Negative for pain and redness.  Respiratory:  Negative for cough, shortness of breath and wheezing.   Cardiovascular:  Negative for chest pain and palpitations.  Gastrointestinal:  Negative for abdominal pain, blood in stool, constipation and diarrhea.  Endocrine: Negative for polydipsia and polyuria.  Genitourinary:  Negative for dysuria, frequency and urgency.  Musculoskeletal:  Positive for arthralgias. Negative for back pain and myalgias.  Skin:  Negative for pallor and rash.  Allergic/Immunologic: Negative for environmental allergies.  Neurological:  Negative for dizziness, syncope and headaches.  Hematological:  Negative for adenopathy. Does not  bruise/bleed easily.  Psychiatric/Behavioral:  Negative for decreased concentration and dysphoric mood. The patient is nervous/anxious.        Objective:   Physical Exam Constitutional:      General: She is not in acute distress.    Appearance: Normal appearance. She is well-developed. She is obese. She is not ill-appearing or diaphoretic.  HENT:     Head: Normocephalic and atraumatic.  Eyes:     Conjunctiva/sclera: Conjunctivae normal.     Pupils: Pupils are equal, round, and reactive to light.  Neck:     Thyroid: No thyromegaly.     Vascular: No carotid bruit or JVD.  Cardiovascular:     Rate and Rhythm: Normal rate and regular rhythm.     Heart sounds: Normal heart sounds.     No gallop.  Pulmonary:     Effort: Pulmonary effort is normal. No respiratory distress.     Breath sounds: Normal breath sounds. No wheezing or rales.  Abdominal:     General: There is no distension or abdominal bruit.     Palpations: Abdomen is soft.  Musculoskeletal:     Cervical back: Normal range of motion and neck supple.     Right lower leg: No edema.     Left lower leg: No edema.     Comments: Limited rom of knees and shoulders   Lymphadenopathy:     Cervical: No cervical adenopathy.  Skin:    General: Skin is warm and dry.     Coloration: Skin is not pale.     Findings: No rash.  Neurological:     Mental Status: She is alert.     Coordination: Coordination normal.     Deep Tendon Reflexes: Reflexes are normal and symmetric. Reflexes normal.  Psychiatric:        Mood and  Affect: Mood normal.     Comments: Pleasant            Assessment & Plan:   Problem List Items Addressed This Visit       Musculoskeletal and Integument   Osteoarthritis of knee    Continues injections  Norco for chronic pain  Working on wt loss to eventually be candidate for knee replacement   Rev last prog note with Dr Floyce Stakes      Relevant Medications   HYDROcodone-acetaminophen (NORCO) 5-325 MG  tablet   Other Relevant Orders   DRUG MONITORING, PANEL 8 WITH CONFIRMATION, URINE     Other   Encounter for chronic pain management - Primary    Doing well with norco 5-325 mg up to every 6 h prn for knee and shoulder arthritis pain #90 per month  No side effects UDS and contract review on 05/21/21, up to date with low risk drug screen- due soon so this was done today [facility] reviewed with no ref flags Refills done to fill on /after 4/26, 5/26, and 07/14/22  for the norco Continues orthopedic care also Enc continued wt loss       Relevant Orders   DRUG MONITORING, PANEL 8 WITH CONFIRMATION, URINE   Hyperlipidemia    Disc goals for lipids and reasons to control them Rev last labs with pt Rev low sat fat diet in detail  Last visit we started zetia 10 mg  Tolerating well  Labs today  Eating better       Relevant Orders   Lipid panel   ALT   AST   Morbid obesity    Wt is down almost 10 more lb with better diet   Disc low glycemic diet goals Disc options for strength training in light of ortho problems (today rev bicep/tricep weight exercises that would not hurt shoulders) Commended  With more wt loss may be safe candidate for knee repl surg which she needs

## 2022-05-12 NOTE — Assessment & Plan Note (Signed)
Wt is down almost 10 more lb with better diet   Disc low glycemic diet goals Disc options for strength training in light of ortho problems (today rev bicep/tricep weight exercises that would not hurt shoulders) Commended  With more wt loss may be safe candidate for knee repl surg which she needs

## 2022-05-12 NOTE — Assessment & Plan Note (Signed)
Continues injections  Norco for chronic pain  Working on wt loss to eventually be candidate for knee replacement   Rev last prog note with Dr Floyce Stakes

## 2022-05-12 NOTE — Assessment & Plan Note (Signed)
Doing well with norco 5-325 mg up to every 6 h prn for knee and shoulder arthritis pain #90 per month  No side effects UDS and contract review on 05/21/21, up to date with low risk drug screen- due soon so this was done today [facility] reviewed with no ref flags Refills done to fill on /after 4/26, 5/26, and 07/14/22  for the norco Continues orthopedic care also Enc continued wt loss

## 2022-05-15 LAB — DRUG MONITORING, PANEL 8 WITH CONFIRMATION, URINE
6 Acetylmorphine: NEGATIVE ng/mL (ref <10)
Alcohol Metabolites: NEGATIVE ng/mL (ref <500)
Alphahydroxyalprazolam: 266 ng/mL — ABNORMAL HIGH (ref <25)
Alphahydroxymidazolam: NEGATIVE ng/mL (ref <50)
Alphahydroxytriazolam: NEGATIVE ng/mL (ref <50)
Aminoclonazepam: NEGATIVE ng/mL (ref <25)
Amphetamines: NEGATIVE ng/mL (ref <500)
Benzodiazepines: POSITIVE ng/mL — AB (ref <100)
Buprenorphine, Urine: NEGATIVE ng/mL (ref <5)
Cocaine Metabolite: NEGATIVE ng/mL (ref <150)
Codeine: NEGATIVE ng/mL (ref <50)
Creatinine: 93.9 mg/dL (ref >=20.0)
Hydrocodone: 534 ng/mL — ABNORMAL HIGH (ref <50)
Hydromorphone: 94 ng/mL — ABNORMAL HIGH (ref <50)
Hydroxyethylflurazepam: NEGATIVE ng/mL (ref <50)
Lorazepam: NEGATIVE ng/mL (ref <50)
MDMA: NEGATIVE ng/mL (ref <500)
Marijuana Metabolite: NEGATIVE ng/mL (ref <20)
Morphine: NEGATIVE ng/mL (ref <50)
Nordiazepam: NEGATIVE ng/mL (ref <50)
Norhydrocodone: 680 ng/mL — ABNORMAL HIGH (ref <50)
Opiates: POSITIVE ng/mL — AB (ref <100)
Oxazepam: NEGATIVE ng/mL (ref <50)
Oxidant: NEGATIVE ug/mL (ref <200)
Oxycodone: NEGATIVE ng/mL (ref <100)
Temazepam: NEGATIVE ng/mL (ref <50)
pH: 6.5 (ref 4.5–9.0)

## 2022-05-15 LAB — DM TEMPLATE

## 2022-06-29 ENCOUNTER — Encounter: Payer: Self-pay | Admitting: Family Medicine

## 2022-06-29 MED ORDER — CYCLOBENZAPRINE HCL 10 MG PO TABS: 10.0000 mg | ORAL_TABLET | Freq: Three times a day (TID) | ORAL | 3 refills | Status: DC | PRN

## 2022-07-11 ENCOUNTER — Other Ambulatory Visit: Payer: Self-pay | Admitting: Family Medicine

## 2022-07-13 ENCOUNTER — Encounter: Payer: Self-pay | Admitting: Family Medicine

## 2022-07-14 DIAGNOSIS — M19011 Primary osteoarthritis, right shoulder: Secondary | ICD-10-CM | POA: Diagnosis not present

## 2022-07-14 DIAGNOSIS — M1712 Unilateral primary osteoarthritis, left knee: Secondary | ICD-10-CM | POA: Diagnosis not present

## 2022-07-14 DIAGNOSIS — M7581 Other shoulder lesions, right shoulder: Secondary | ICD-10-CM | POA: Diagnosis not present

## 2022-07-14 MED ORDER — HYDROCODONE-ACETAMINOPHEN 5-325 MG PO TABS: 1.0000 | ORAL_TABLET | Freq: Four times a day (QID) | ORAL | 0 refills | Status: DC | PRN

## 2022-07-14 MED ORDER — ALPRAZOLAM 1 MG PO TABS: ORAL_TABLET | ORAL | 2 refills | Status: DC

## 2022-08-11 ENCOUNTER — Ambulatory Visit (INDEPENDENT_AMBULATORY_CARE_PROVIDER_SITE_OTHER): Payer: PPO | Admitting: Family Medicine

## 2022-08-11 ENCOUNTER — Encounter: Payer: Self-pay | Admitting: Family Medicine

## 2022-08-11 VITALS — BP 114/68 | HR 77 | Temp 97.8°F | Ht 60.0 in | Wt 290.5 lb

## 2022-08-11 DIAGNOSIS — M170 Bilateral primary osteoarthritis of knee: Secondary | ICD-10-CM

## 2022-08-11 DIAGNOSIS — G8929 Other chronic pain: Secondary | ICD-10-CM | POA: Diagnosis not present

## 2022-08-11 MED ORDER — HYDROCODONE-ACETAMINOPHEN 5-325 MG PO TABS: 1.0000 | ORAL_TABLET | Freq: Four times a day (QID) | ORAL | 0 refills | Status: DC | PRN

## 2022-08-11 NOTE — Progress Notes (Signed)
Subjective:    Patient ID: Monica Madden, female    DOB: 07/24/1960, 62 y.o.   MRN: [id number]  HPI  Wt Readings from Last 3 Encounters:  08/11/22 290 lb 8 oz (131.8 kg)  05/12/22 286 lb 8 oz (130 kg)  03/17/22 297 lb 6 oz (134.9 kg)   56.73 kg/m  Vitals:   08/11/22 0949  BP: 114/68  Pulse: 77  Temp: 97.8 F (36.6 C)  SpO2: 97%   Pt presents for 3 month encounter for chronic pain management  Doing about the same  Staying home mostly  Went swimming a few weeks ago   Doing a little exercise at home- walking inside house  Light weights for upper body   Chronic pain of knees and shoulders From OA   Ortho follow up was 6/26 Had injection of left knee and right shoulder that day- did help  Discussed need for weight loss and eventual knee replacement  Sees Dr Floyce Stakes   Weather change makes knees worse    Also prn flexeril for spasm in shoulder   Medication and dose: norco 5-325 mg every 6 hours prn Last fill 07/15/22 90 pills (told not to fill before 6/26 on that prescription)   # pills per month: 90    Last UDS date: 05/12/22   low risk  Opioid Treatment Agreement signed (Y/N): yes  Opioid Treatment Agreement last reviewed with patient:   05/12/22  [facility] reviewed this encounter (include red flags):  today / no red flags   Also takes xanax for anxiety- aware      Patient Active Problem List   Diagnosis Date Noted   Colon cancer screening 03/17/2021   Paresthesia of both hands 08/06/2020   Medicare annual wellness visit, subsequent 12/01/2018   Encounter for chronic pain management 02/07/2017   Chronic pain 11/17/2015   Elevated glucose level 11/09/2015   Mild intermittent asthma without complication 10/21/2015   Osteoarthritis of knee 07/18/2013   Routine general medical examination at a health care facility 06/30/2011   Amenorrhea 06/30/2011   IRRITABLE BOWEL SYNDROME 08/07/2009   KNEE PAIN, BILATERAL 08/07/2009   Pain in joint involving lower  leg 08/07/2009   CONSTIPATION 01/15/2009   LEG PAIN, LEFT 10/09/2008   Hyperlipidemia 05/09/2008   Essential hypertension 03/29/2008   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 06/05/2007   INCI HERNIA WITHOUT MENTION OBSTRUCTION/GANGRENE 05/12/2007   FREQUENCY, URINARY 05/12/2007   DEPENDENT EDEMA, LEGS 11/03/2006   Morbid obesity (HCC) 07/15/2006   Dysthymic disorder 07/15/2006   MIGRAINE HEADACHE 07/15/2006   RETINITIS PIGMENTOSA 07/15/2006   VARICOSE VEINS, LOWER EXTREMITIES 07/15/2006   ALLERGIC RHINITIS 07/15/2006   GERD 07/15/2006   BACK PAIN, LUMBAR, CHRONIC 07/15/2006   INCONTINENCE, URGE 07/15/2006   Asthma 07/15/2006   Retinitis pigmentosa 07/15/2006   Past Medical History:  Diagnosis Date   Allergy    allergic rhinitis   Anemia    Anxiety    ATTACKS   Arthritis    Asthma    Cataract    left cataract removal 12/2008   Chronic back pain    OA of spine   Depression    GERD (gastroesophageal reflux disease)    Endo negative 02/2000   Hypertension 03/2008   IBS (irritable bowel syndrome)    MRSA infection 2008   Right leg   Obesity    Ovarian cyst    Retinitis pigmentosa    blind   Past Surgical History:  Procedure Laterality Date  APPENDECTOMY     BLADDER SURGERY  1968   age 69 ? dilitation   CATARACT EXTRACTION W/PHACO Right 11/11/2014   Procedure: CATARACT EXTRACTION PHACO AND INTRAOCULAR LENS PLACEMENT (IOC);  Surgeon: Sallee Lange, MD;  Location: [facility];  Service: Ophthalmology;  Laterality: Right;  LOT PACK: [id number] H [place]: 00:29.7 AP: 11.2 CDE: 7.14   CESAREAN SECTION     CHOLECYSTECTOMY     CYSTOSCOPY  03/2008   negative// Dr. Wanda Plump urologist   ENDOMETRIAL BIOPSY  07/1998   negative   EYE SURGERY  12/2008   cataract removal left   HERNIA REPAIR  05/2006   ruptured hernia repair after fall and then two more surgeries within as many weeks for complications(05/2006) Umbilical hernia repair (06/1997)   KNEE ARTHROSCOPY W/  MENISCAL REPAIR     left knne   Social History   Tobacco Use   Smoking status: Never   Smokeless tobacco: Never  Vaping Use   Vaping status: Never Used  Substance Use Topics   Alcohol use: Yes    Comment: twice a year   Drug use: Yes    Types: Marijuana   Family History  Problem Relation Age of Onset   Depression Mother    Heart disease Father    Diabetes Maternal Uncle    Hypertension Maternal Grandmother    Heart disease Paternal Grandmother    Heart disease Paternal Grandfather    Depression Brother        Depression secondary to MVA injuries   Hypertension Brother    Breast cancer Neg Hx    Allergies  Allergen Reactions   Diclofenac Sodium     REACTION: allergic/ made asthma bad   Etodolac     REACTION: GI   Metronidazole     REACTION: GI side eff   Nsaids Other (See Comments)    GI Upset   Oxybutynin Itching   Rosuvastatin     Myalgias    Sulfamethoxazole-Trimethoprim    Tramadol Other (See Comments)    Caused elevated BP   Sulfa Antibiotics Itching and Rash   Current Outpatient Medications on File Prior to Visit  Medication Sig Dispense Refill   Albuterol Sulfate (PROAIR HFA IN) Inhale 2 puffs into the lungs every 6 (six) hours as needed.     ALLERGY RELIEF 180 MG tablet TAKE 1 TABLET BY MOUTH EVERY DAY 90 tablet 1   ALPRAZolam (XANAX) 1 MG tablet TAKE 1 TABLET(1 MG) BY MOUTH THREE TIMES DAILY AS NEEDED 90 tablet 2   Ascorbic Acid (VITAMIN C) 1000 MG tablet Take 1,000 mg by mouth daily.  Vitamin C with Rosehips     Bee Pollen 500 MG CHEW Chew 500 mg by mouth in the morning and at bedtime.     BLACK ELDERBERRY PO Take 1,150 mg by mouth daily.     Calcium Carbonate-Vitamin D 600-400 MG-UNIT tablet Take 1 tablet by mouth daily.     Coenzyme Q10 (CO Q 10) 100 MG CAPS Take 1 Capful by mouth daily.     Cranberry-Vitamin C-Probiotic (AZO CRANBERRY PO) Take 500 mg by mouth every other day.     cyclobenzaprine (FLEXERIL) 10 MG tablet Take 1 tablet (10 mg total)  by mouth 3 (three) times daily as needed for muscle spasms. Caution of sedation 30 tablet 3   ezetimibe (ZETIA) 10 MG tablet Take 1 tablet (10 mg total) by mouth daily. 90 tablet 3   fluticasone (FLONASE) 50 MCG/ACT nasal spray SHAKE LIQUID AND USE 2 SPRAYS  IN EACH NOSTRIL TWICE DAILY 48 g 1   lisinopril (ZESTRIL) 5 MG tablet TAKE 1 TABLET(5 MG) BY MOUTH EVERY MORNING 90 tablet 1   montelukast (SINGULAIR) 10 MG tablet TAKE 1 TABLET BY MOUTH EVERY DAY 90 tablet 1   Omega 3-6-9 Fatty Acids (OMEGA 3-6-9 COMPLEX PO) Take one tablet by mouth daily     omeprazole (PRILOSEC) 40 MG capsule TAKE 1 CAPSULE BY MOUTH EVERY DAY 90 capsule 1   OVER THE COUNTER MEDICATION daily as needed. HYLAND'S CELL SALTS #8 MAGNESIA PHOSPHORICA     OVER THE COUNTER MEDICATION Take 10 mg by mouth daily. VITAL PROTEINS COLLAGEN PEPTIDES     sertraline (ZOLOFT) 50 MG tablet TAKE 1 TABLET(50 MG) BY MOUTH DAILY 90 tablet 1   tolterodine (DETROL LA) 4 MG 24 hr capsule TAKE 1 CAPSULE(4 MG) BY MOUTH DAILY AS NEEDED 90 capsule 3   No current facility-administered medications on file prior to visit.    Review of Systems  Constitutional:  Negative for activity change, appetite change, fatigue, fever and unexpected weight change.  HENT:  Negative for congestion, ear pain, rhinorrhea, sinus pressure and sore throat.   Eyes:  Negative for pain, redness and visual disturbance.  Respiratory:  Negative for cough, shortness of breath and wheezing.   Cardiovascular:  Negative for chest pain and palpitations.  Gastrointestinal:  Negative for abdominal pain, blood in stool, constipation and diarrhea.  Endocrine: Negative for polydipsia and polyuria.  Genitourinary:  Negative for dysuria, frequency and urgency.  Musculoskeletal:  Positive for arthralgias and back pain. Negative for myalgias.  Skin:  Negative for pallor and rash.  Allergic/Immunologic: Negative for environmental allergies.  Neurological:  Negative for dizziness, syncope  and headaches.  Hematological:  Negative for adenopathy. Does not bruise/bleed easily.  Psychiatric/Behavioral:  Positive for dysphoric mood. Negative for decreased concentration. The patient is nervous/anxious.        Objective:   Physical Exam Constitutional:      General: She is not in acute distress.    Appearance: Normal appearance. She is obese. She is not ill-appearing.  Eyes:     Pupils: Pupils are equal, round, and reactive to light.     Comments: Vision impaired   Cardiovascular:     Rate and Rhythm: Regular rhythm.  Pulmonary:     Effort: Pulmonary effort is normal. No respiratory distress.     Breath sounds: Normal breath sounds. No wheezing.  Musculoskeletal:     Comments: Limited rom knees/ worse on left  Limited abd of UEs due to shoulder pain  Walks with cane  No oa changes in hands  No swelling    Neurological:     Mental Status: She is alert.     Sensory: No sensory deficit.     Motor: No weakness.  Psychiatric:        Attention and Perception: Attention normal.        Mood and Affect: Mood is anxious.        Cognition and Memory: Cognition and memory normal.     Comments: Pleasant  Candidly discusses symptoms and stressors    Discussed mood ups/downs  Some depression at times  Some emot eating   Anxiety is stable           Assessment & Plan:   Problem List Items Addressed This Visit       Musculoskeletal and Integument   Osteoarthritis of knee    Continues follow up Dr Floyce Stakes for injections Reviewed last  note in 07/14/22 Had injection  Needs knee replacement but needs to loose weight for this to be safe        Relevant Medications   HYDROcodone-acetaminophen (NORCO) 5-325 MG tablet     Other   Morbid obesity (HCC)    Pt continues to struggle with this  Limited exercise due to severe OA  Also vision impaired  Is walking as tolerated- encouraged more strength training with light weights in chair as tolerated  Daughter no longer  cooks for her - a challenge  Recommend mediterranean diet / low glycemic   May be candidate for GLP in future if cost goes down Not candidate for contrave due to being on narcotic   Discussed possible wellbutrin for depression in future - may help mood/cravings and emotional eating Given info for family to look at /pt will reach out if interested       Encounter for chronic pain management - Primary    Doing well with norco 5-325 mg up to every 6 h prn for knee and shoulder arthritis pain #90 per month  No side effects UDS and contract review on 05/12/22, up to date  [facility] reviewed with no ref flags today (also takes xanax) Refills done to fill on /after 7/26, 8/26 and 9/26 for the norco  Continues orthopedic care also- had injections in knee and shoulder on 6/26 with Dr Caren Hazy continued work on [employer] loss/ still struggled with this

## 2022-08-11 NOTE — Patient Instructions (Addendum)
Take care of yourself   Keep working on weight loss  Try to get most of your carbohydrates from produce (with the exception of white potatoes) and whole grains Eat less bread/pasta/rice/snack foods/cereals/sweets and other items from the middle of the grocery store (processed carbs)   Any strength training you can do is helpful Add some strength training to your routine, this is important for bone and brain health and can reduce your risk of falls and help your body use insulin properly and regulate weight  Light weights, exercise bands , and internet videos are a good way to start  Yoga (chair or regular), machines , floor exercises or a gym with machines are also good options   No change in medications   Follow up in 3 months for chronic pain

## 2022-08-11 NOTE — Assessment & Plan Note (Signed)
Continues follow up Dr Floyce Stakes for injections Reviewed last note in 07/14/22 Had injection  Needs knee replacement but needs to loose weight for this to be safe

## 2022-08-11 NOTE — Assessment & Plan Note (Signed)
Pt continues to struggle with this  Limited exercise due to severe OA  Also vision impaired  Is walking as tolerated- encouraged more strength training with light weights in chair as tolerated  Daughter no longer cooks for her - a challenge  Recommend mediterranean diet / low glycemic   May be candidate for GLP in future if cost goes down Not candidate for contrave due to being on narcotic   Discussed possible wellbutrin for depression in future - may help mood/cravings and emotional eating Given info for family to look at /pt will reach out if interested

## 2022-08-11 NOTE — Assessment & Plan Note (Signed)
Doing well with norco 5-325 mg up to every 6 h prn for knee and shoulder arthritis pain #90 per month  No side effects UDS and contract review on 05/12/22, up to date  NCCSRS reviewed with no ref flags today (also takes xanax) Refills done to fill on /after 7/26, 8/26 and 9/26 for the norco  Continues orthopedic care also- had injections in knee and shoulder on 6/26 with Dr Caren Hazy continued work on [employer] loss/ still struggled with this

## 2022-09-12 ENCOUNTER — Encounter: Payer: Self-pay | Admitting: Family Medicine

## 2022-09-13 MED ORDER — BUPROPION HCL ER (XL) 150 MG PO TB24: 150.0000 mg | ORAL_TABLET | Freq: Every day | ORAL | 1 refills | Status: DC

## 2022-09-13 MED ORDER — ALBUTEROL SULFATE HFA 108 (90 BASE) MCG/ACT IN AERS: 2.0000 | INHALATION_SPRAY | Freq: Four times a day (QID) | RESPIRATORY_TRACT | 5 refills | Status: AC | PRN

## 2022-10-10 ENCOUNTER — Other Ambulatory Visit: Payer: Self-pay | Admitting: Family Medicine

## 2022-10-13 ENCOUNTER — Other Ambulatory Visit: Payer: Self-pay | Admitting: Family Medicine

## 2022-10-14 ENCOUNTER — Other Ambulatory Visit: Payer: Self-pay | Admitting: Family Medicine

## 2022-10-14 NOTE — Telephone Encounter (Signed)
Name of Medication: xanax  Name of Pharmacy: [facility] or Written Date and Quantity: 07/14/22 #90 tab/ 2 refills Last Office Visit and Type: 08/11/22 3 month f/u Next Office Visit and Type: 11/12/22 3 month f/u Last Controlled Substance Agreement Date: 05/12/22 Last UDS:05/12/22

## 2022-10-17 ENCOUNTER — Encounter: Payer: Self-pay | Admitting: Family Medicine

## 2022-10-18 MED ORDER — FEXOFENADINE HCL 180 MG PO TABS: 180.0000 mg | ORAL_TABLET | Freq: Every day | ORAL | 1 refills | Status: AC

## 2022-10-29 DIAGNOSIS — M5441 Lumbago with sciatica, right side: Secondary | ICD-10-CM | POA: Diagnosis not present

## 2022-10-29 DIAGNOSIS — M47816 Spondylosis without myelopathy or radiculopathy, lumbar region: Secondary | ICD-10-CM | POA: Diagnosis not present

## 2022-11-11 ENCOUNTER — Encounter: Payer: Self-pay | Admitting: Family Medicine

## 2022-11-11 ENCOUNTER — Ambulatory Visit: Payer: PPO | Admitting: Family Medicine

## 2022-11-11 VITALS — BP 118/66 | HR 85 | Temp 98.1°F | Ht 60.0 in | Wt 290.5 lb

## 2022-11-11 DIAGNOSIS — F341 Dysthymic disorder: Secondary | ICD-10-CM

## 2022-11-11 DIAGNOSIS — G8929 Other chronic pain: Secondary | ICD-10-CM

## 2022-11-11 MED ORDER — ALPRAZOLAM 1 MG PO TABS: ORAL_TABLET | ORAL | 3 refills | Status: DC

## 2022-11-11 MED ORDER — HYDROCODONE-ACETAMINOPHEN 5-325 MG PO TABS: 1.0000 | ORAL_TABLET | Freq: Four times a day (QID) | ORAL | 0 refills | Status: DC | PRN

## 2022-11-11 NOTE — Progress Notes (Signed)
Subjective:    Patient ID: Monica Madden, female    DOB: 01/17/1961, 62 y.o.   MRN: [id number]  HPI  Wt Readings from Last 3 Encounters:  11/11/22 290 lb 8 oz (131.8 kg)  08/11/22 290 lb 8 oz (131.8 kg)  05/12/22 286 lb 8 oz (130 kg)   56.73 kg/m  Vitals:   11/11/22 1033  BP: 118/66  Pulse: 85  Temp: 98.1 F (36.7 C)  SpO2: 98%     Pt presents for chronic pain encounter  Also follow up for wellbutrin   Indication for chronic opioid: arthritis pain knees and shoulders    Medication and dose: norco 5-325 mg every 6 h prn # pills per month: 90 Last UDS date: 05/12/22  low risk Opioid Treatment Agreement signed (Y/N): yes Opioid Treatment Agreement last reviewed with patient:  05/12/22  NCCSRS reviewed this encounter (include red flags): Yestoday, no red flags   Last refill 10/14/22   Will be switching pharmacies next week   Also takes xanax for anxiety   Saw ortho on 10/11 for knees/shoulders and mentioned some new pain in right leg Called in prednisone- 10 days It did help  Thought it was coming from low back  Gets cortisone shots left knee and right shoulder tomorrow   Wellbutrin xl 150 mg  Not changing appetite  She is a little more jittery / tapping fingers  Feels like it helps her depression   Also sertraline 50 mg daily    Stress Her health  Husband's health is worse- she has to do a lot     Patient Active Problem List   Diagnosis Date Noted   Colon cancer screening 03/17/2021   Paresthesia of both hands 08/06/2020   Medicare annual wellness visit, subsequent 12/01/2018   Encounter for chronic pain management 02/07/2017   Chronic pain 11/17/2015   Elevated glucose level 11/09/2015   Mild intermittent asthma without complication 10/21/2015   Osteoarthritis of knee 07/18/2013   Routine general medical examination at a health care facility 06/30/2011   Amenorrhea 06/30/2011   IRRITABLE BOWEL SYNDROME 08/07/2009   KNEE PAIN, BILATERAL  08/07/2009   Pain in joint involving lower leg 08/07/2009   Constipation 01/15/2009   LEG PAIN, LEFT 10/09/2008   Hyperlipidemia 05/09/2008   Essential hypertension 03/29/2008   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 06/05/2007   Incisional hernia 05/12/2007   FREQUENCY, URINARY 05/12/2007   DEPENDENT EDEMA, LEGS 11/03/2006   Morbid obesity (HCC) 07/15/2006   Dysthymic disorder 07/15/2006   Migraine headache 07/15/2006   RETINITIS PIGMENTOSA 07/15/2006   VARICOSE VEINS, LOWER EXTREMITIES 07/15/2006   Allergic rhinitis 07/15/2006   GERD 07/15/2006   BACK PAIN, LUMBAR, CHRONIC 07/15/2006   INCONTINENCE, URGE 07/15/2006   Asthma 07/15/2006   Retinitis pigmentosa 07/15/2006   Past Medical History:  Diagnosis Date   Allergy    allergic rhinitis   Anemia    Anxiety    ATTACKS   Arthritis    Asthma    Cataract    left cataract removal 12/2008   Chronic back pain    OA of spine   Depression    GERD (gastroesophageal reflux disease)    Endo negative 02/2000   Hypertension 03/2008   IBS (irritable bowel syndrome)    MRSA infection 2008   Right leg   Obesity    Ovarian cyst    Retinitis pigmentosa    blind   Past Surgical History:  Procedure Laterality Date  APPENDECTOMY     BLADDER SURGERY  1968   age 12 ? dilitation   CATARACT EXTRACTION W/PHACO Right 11/11/2014   Procedure: CATARACT EXTRACTION PHACO AND INTRAOCULAR LENS PLACEMENT (IOC);  Surgeon: Sallee Lange, MD;  Location: [facility] OR[facility];  Service: Ophthalmology;  Laterality: Right;  LOT PACK: [id number] H [place]: 00:29.7 AP: 11.2 CDE: 7.14   CESAREAN SECTION     CHOLECYSTECTOMY     CYSTOSCOPY  03/2008   negative// Dr. Wanda Plump urologist   ENDOMETRIAL BIOPSY  07/1998   negative   EYE SURGERY  12/2008   cataract removal left   HERNIA REPAIR  05/2006   ruptured hernia repair after fall and then two more surgeries within as many weeks for complications(05/2006) Umbilical hernia repair (06/1997)   KNEE  ARTHROSCOPY W/ MENISCAL REPAIR     left knne   Social History   Tobacco Use   Smoking status: Never   Smokeless tobacco: Never  Vaping Use   Vaping status: Never Used  Substance Use Topics   Alcohol use: Yes    Comment: twice a year   Drug use: Yes    Types: Marijuana   Family History  Problem Relation Age of Onset   Depression Mother    Heart disease Father    Diabetes Maternal Uncle    Hypertension Maternal Grandmother    Heart disease Paternal Grandmother    Heart disease Paternal Grandfather    Depression Brother        Depression secondary to MVA injuries   Hypertension Brother    Breast cancer Neg Hx    Allergies  Allergen Reactions   Diclofenac Sodium     REACTION: allergic/ made asthma bad   Etodolac     REACTION: GI   Metronidazole     REACTION: GI side eff   Nsaids Other (See Comments)    GI Upset   Oxybutynin Itching   Rosuvastatin     Myalgias    Sulfamethoxazole-Trimethoprim    Tramadol Other (See Comments)    Caused elevated BP   Sulfa Antibiotics Itching and Rash   Current Outpatient Medications on File Prior to Visit  Medication Sig Dispense Refill   albuterol (PROAIR HFA) 108 (90 Base) MCG/ACT inhaler Inhale 2 puffs into the lungs every 6 (six) hours as needed for wheezing or shortness of breath. 1 each 5   Ascorbic Acid (VITAMIN C) 1000 MG tablet Take 1,000 mg by mouth daily.  Vitamin C with Rosehips     Bee Pollen 500 MG CHEW Chew 500 mg by mouth in the morning and at bedtime.     BLACK ELDERBERRY PO Take 1,150 mg by mouth daily.     buPROPion (WELLBUTRIN XL) 150 MG 24 hr tablet Take 1 tablet (150 mg total) by mouth daily. 90 tablet 1   Calcium Carbonate-Vitamin D 600-400 MG-UNIT tablet Take 1 tablet by mouth daily.     Coenzyme Q10 (CO Q 10) 100 MG CAPS Take 1 Capful by mouth daily.     Cranberry-Vitamin C-Probiotic (AZO CRANBERRY PO) Take 500 mg by mouth every other day.     cyclobenzaprine (FLEXERIL) 10 MG tablet Take 1 tablet (10 mg  total) by mouth 3 (three) times daily as needed for muscle spasms. Caution of sedation 30 tablet 3   ezetimibe (ZETIA) 10 MG tablet Take 1 tablet (10 mg total) by mouth daily. 90 tablet 3   fexofenadine (ALLERGY RELIEF) 180 MG tablet Take 1 tablet (180 mg total) by mouth daily. 90 tablet 1  fluticasone (FLONASE) 50 MCG/ACT nasal spray SHAKE LIQUID AND USE 2 SPRAYS IN EACH NOSTRIL TWICE DAILY 48 g 1   lisinopril (ZESTRIL) 5 MG tablet TAKE 1 TABLET(5 MG) BY MOUTH EVERY MORNING 90 tablet 1   montelukast (SINGULAIR) 10 MG tablet TAKE 1 TABLET BY MOUTH EVERY DAY 90 tablet 1   Omega 3-6-9 Fatty Acids (OMEGA 3-6-9 COMPLEX PO) Take one tablet by mouth daily     omeprazole (PRILOSEC) 40 MG capsule TAKE 1 CAPSULE BY MOUTH EVERY DAY 90 capsule 1   OVER THE COUNTER MEDICATION daily as needed. HYLAND'S CELL SALTS #8 MAGNESIA PHOSPHORICA     OVER THE COUNTER MEDICATION Take 10 mg by mouth daily. VITAL PROTEINS COLLAGEN PEPTIDES     sertraline (ZOLOFT) 50 MG tablet TAKE 1 TABLET(50 MG) BY MOUTH DAILY 90 tablet 1   tolterodine (DETROL LA) 4 MG 24 hr capsule TAKE 1 CAPSULE(4 MG) BY MOUTH DAILY AS NEEDED 90 capsule 3   No current facility-administered medications on file prior to visit.    Review of Systems  Constitutional:  Negative for activity change, appetite change, fatigue, fever and unexpected weight change.  HENT:  Negative for congestion, ear pain, rhinorrhea, sinus pressure and sore throat.   Eyes:  Negative for pain, redness and visual disturbance.       Legally blind  Respiratory:  Negative for cough, shortness of breath and wheezing.   Cardiovascular:  Negative for chest pain and palpitations.  Gastrointestinal:  Negative for abdominal pain, blood in stool, constipation and diarrhea.  Endocrine: Negative for polydipsia and polyuria.  Genitourinary:  Negative for dysuria, frequency and urgency.  Musculoskeletal:  Positive for arthralgias and back pain. Negative for myalgias.  Skin:  Negative  for pallor and rash.  Allergic/Immunologic: Negative for environmental allergies.  Neurological:  Negative for dizziness, syncope and headaches.  Hematological:  Negative for adenopathy. Does not bruise/bleed easily.  Psychiatric/Behavioral:  Positive for dysphoric mood. The patient is nervous/anxious.        Mood is overall improved        Objective:   Physical Exam Constitutional:      General: She is not in acute distress.    Appearance: Normal appearance. She is well-developed. She is obese. She is not ill-appearing or diaphoretic.  HENT:     Head: Normocephalic and atraumatic.  Eyes:     Conjunctiva/sclera: Conjunctivae normal.     Pupils: Pupils are equal, round, and reactive to light.     Comments: Vision is impaired   Neck:     Thyroid: No thyromegaly.     Vascular: No carotid bruit or JVD.  Cardiovascular:     Rate and Rhythm: Normal rate and regular rhythm.     Heart sounds: Normal heart sounds.     No gallop.  Pulmonary:     Effort: Pulmonary effort is normal. No respiratory distress.     Breath sounds: Normal breath sounds. No wheezing or rales.  Abdominal:     General: There is no distension or abdominal bruit.     Palpations: Abdomen is soft.  Musculoskeletal:     Cervical back: Normal range of motion and neck supple.     Right lower leg: No edema.     Left lower leg: No edema.  Lymphadenopathy:     Cervical: No cervical adenopathy.  Skin:    General: Skin is warm and dry.     Coloration: Skin is not pale.     Findings: No bruising or rash.  Neurological:     Mental Status: She is alert.     Coordination: Coordination normal.     Deep Tendon Reflexes: Reflexes are normal and symmetric. Reflexes normal.     Comments: No tremor   Psychiatric:        Mood and Affect: Mood normal.           Assessment & Plan:   Problem List Items Addressed This Visit       Other   Dysthymic disorder    Stress levels remain high  Continues sertraline   Addition of wellbutrin xl 150 mg helped depression symptoms She wants to continue this despite some side effect of restlessness/ jitters For this reason , will not advance dose  Encouraged good self care   Also refilled long term xanax for anxiety - unfortunately this is necessary and has taken for years       Relevant Medications   ALPRAZolam (XANAX) 1 MG tablet   Encounter for chronic pain management - Primary    Doing well with norco 5-325 mg up to every 6 h prn for knee and shoulder arthritis pain #90 per month  No side effects UDS and contract review on 05/12/22, up to date  NCCSRS reviewed with no ref flags today (also takes xanax) Refills done to fill on /after 10/26, 11/26 and 01/13/23  for the norco  Continues orthopedic care also- had injections in knee and shoulder planned soon withDr Caren Hazy continued work on [employer] loss/ still struggled with this       Morbid obesity (HCC)    Unfotunately wellbutrin has not helped appetite

## 2022-11-11 NOTE — Patient Instructions (Signed)
Take care of yourself  No change in medicines

## 2022-11-11 NOTE — Assessment & Plan Note (Addendum)
Stress levels remain high  Continues sertraline  Addition of wellbutrin xl 150 mg helped depression symptoms She wants to continue this despite some side effect of restlessness/ jitters For this reason , will not advance dose  Encouraged good self care   Also refilled long term xanax for anxiety - unfortunately this is necessary and has taken for years

## 2022-11-11 NOTE — Assessment & Plan Note (Signed)
Unfotunately wellbutrin has not helped appetite

## 2022-11-11 NOTE — Assessment & Plan Note (Signed)
Doing well with norco 5-325 mg up to every 6 h prn for knee and shoulder arthritis pain #90 per month  No side effects UDS and contract review on 05/12/22, up to date  NCCSRS reviewed with no ref flags today (also takes xanax) Refills done to fill on /after 10/26, 11/26 and 01/13/23  for the norco  Continues orthopedic care also- had injections in knee and shoulder planned soon withDr Caren Hazy continued work on [employer] loss/ still struggled with this

## 2022-11-12 ENCOUNTER — Ambulatory Visit: Payer: PPO | Admitting: Family Medicine

## 2022-11-12 DIAGNOSIS — M1712 Unilateral primary osteoarthritis, left knee: Secondary | ICD-10-CM | POA: Diagnosis not present

## 2022-11-12 DIAGNOSIS — M7581 Other shoulder lesions, right shoulder: Secondary | ICD-10-CM | POA: Diagnosis not present

## 2022-11-24 ENCOUNTER — Encounter: Payer: Self-pay | Admitting: Family Medicine

## 2022-11-25 MED ORDER — BUPROPION HCL ER (XL) 300 MG PO TB24: 300.0000 mg | ORAL_TABLET | Freq: Every day | ORAL | 3 refills | Status: DC

## 2022-12-09 ENCOUNTER — Encounter: Payer: Self-pay | Admitting: Family Medicine

## 2022-12-10 MED ORDER — EZETIMIBE 10 MG PO TABS: 10.0000 mg | ORAL_TABLET | Freq: Every day | ORAL | 1 refills | Status: DC

## 2022-12-13 ENCOUNTER — Ambulatory Visit (INDEPENDENT_AMBULATORY_CARE_PROVIDER_SITE_OTHER): Payer: PPO

## 2022-12-13 VITALS — Ht 60.0 in | Wt 290.0 lb

## 2022-12-13 DIAGNOSIS — Z0000 Encounter for general adult medical examination without abnormal findings: Secondary | ICD-10-CM | POA: Diagnosis not present

## 2022-12-13 NOTE — Progress Notes (Addendum)
Subjective:   Monica Madden is a 62 y.o. female who presents for Medicare Annual (Subsequent) preventive examination.  Visit Complete: Virtual I connected with  [name] on 12/13/22 by a audio enabled telemedicine application and verified that I am speaking with the correct person using two identifiers.  Patient Location: Home  Provider Location: Home Office  I discussed the limitations of evaluation and management by telemedicine. The patient expressed understanding and agreed to proceed.  Vital Signs: Because this visit was a virtual/telehealth visit, some criteria may be missing or patient reported. Any vitals not documented were not able to be obtained and vitals that have been documented are patient reported.  Patient Medicare AWV questionnaire was completed by the patient on (not done); I have confirmed that all information answered by patient is correct and no changes since this date.  Cardiac Risk Factors include: advanced age (>54men, >76 women);dyslipidemia;hypertension;sedentary lifestyle;obesity (BMI >30kg/m2)     Objective:    Today's Vitals   12/13/22 1116 12/13/22 1118  Weight: 290 lb (131.5 kg)   Height: 5' (1.524 m)   PainSc:  2    Body mass index is 56.64 kg/m.     12/13/2022   11:38 AM 12/09/2021   11:24 AM 12/03/2020   11:22 AM 11/22/2017    8:58 AM 03/25/2016    1:55 PM 11/11/2014    7:23 AM  Advanced Directives  Does Patient Have a Medical Advance Directive? Yes No Yes Yes Yes Yes  Type of Estate agent of [place];Living will  Healthcare Power of [place];Living will Living will Healthcare Power of [place];Living will Living will  Does patient want to make changes to medical advance directive?   Yes (MAU/Ambulatory/Procedural Areas - Information given)   No - Patient declined  Copy of Healthcare Power of Attorney in Chart? No - copy requested    No - copy requested No - copy requested  Would patient like information on  creating a medical advance directive?  No - Patient declined        Current Medications (verified) Outpatient Encounter Medications as of 12/13/2022  Medication Sig   albuterol (PROAIR HFA) 108 (90 Base) MCG/ACT inhaler Inhale 2 puffs into the lungs every 6 (six) hours as needed for wheezing or shortness of breath.   ALPRAZolam (XANAX) 1 MG tablet TAKE 1 TABLET(1 MG) BY MOUTH THREE TIMES DAILY AS NEEDED   Ascorbic Acid (VITAMIN C) 1000 MG tablet Take 1,000 mg by mouth daily.  Vitamin C with [place]   Bee Pollen 500 MG CHEW Chew 500 mg by mouth in the morning and at bedtime.   BLACK ELDERBERRY PO Take 1,150 mg by mouth daily.   buPROPion (WELLBUTRIN XL) 300 MG 24 hr tablet Take 1 tablet (300 mg total) by mouth daily.   Calcium Carbonate-Vitamin D 600-400 MG-UNIT tablet Take 1 tablet by mouth daily.   Coenzyme Q10 (CO Q 10) 100 MG CAPS Take 1 Capful by mouth daily.   Cranberry-Vitamin C-Probiotic (AZO CRANBERRY PO) Take 500 mg by mouth every other day.   cyclobenzaprine (FLEXERIL) 10 MG tablet Take 1 tablet (10 mg total) by mouth 3 (three) times daily as needed for muscle spasms. Caution of sedation   ezetimibe (ZETIA) 10 MG tablet Take 1 tablet (10 mg total) by mouth daily.   fexofenadine (ALLERGY RELIEF) 180 MG tablet Take 1 tablet (180 mg total) by mouth daily.   fluticasone (FLONASE) 50 MCG/ACT nasal spray SHAKE LIQUID AND USE 2 SPRAYS IN  EACH NOSTRIL TWICE DAILY   HYDROcodone-acetaminophen (NORCO) 5-325 MG tablet Take 1 tablet by mouth every 6 (six) hours as needed for moderate pain (pain score 4-6) or severe pain (pain score 7-10). Do not fill before  01/13/23   lisinopril (ZESTRIL) 5 MG tablet TAKE 1 TABLET(5 MG) BY MOUTH EVERY MORNING   montelukast (SINGULAIR) 10 MG tablet TAKE 1 TABLET BY MOUTH EVERY DAY   Omega 3-6-9 Fatty Acids (OMEGA 3-6-9 COMPLEX PO) Take one tablet by mouth daily   omeprazole (PRILOSEC) 40 MG capsule TAKE 1 CAPSULE BY MOUTH EVERY DAY   OVER THE COUNTER MEDICATION  daily as needed. HYLAND'S CELL SALTS #8 MAGNESIA PHOSPHORICA   OVER THE COUNTER MEDICATION Take 10 mg by mouth daily. VITAL PROTEINS COLLAGEN PEPTIDES   sertraline (ZOLOFT) 50 MG tablet TAKE 1 TABLET(50 MG) BY MOUTH DAILY   tolterodine (DETROL LA) 4 MG 24 hr capsule TAKE 1 CAPSULE(4 MG) BY MOUTH DAILY AS NEEDED   No facility-administered encounter medications on file as of 12/13/2022.    Allergies (verified) Diclofenac sodium, Etodolac, Metronidazole, Nsaids, Oxybutynin, Rosuvastatin, Sulfamethoxazole-trimethoprim, Tramadol, and Sulfa antibiotics   History: Past Medical History:  Diagnosis Date   Allergy    allergic rhinitis   Anemia    Anxiety    ATTACKS   Arthritis    Asthma    Cataract    left cataract removal 12/2008   Chronic back pain    OA of spine   Depression    GERD (gastroesophageal reflux disease)    Endo negative 02/2000   Hypertension 03/2008   IBS (irritable bowel syndrome)    MRSA infection 2008   Right leg   Obesity    Ovarian cyst    Retinitis pigmentosa    blind   Past Surgical History:  Procedure Laterality Date   APPENDECTOMY     BLADDER SURGERY  1968   age 18 ? dilitation   CATARACT EXTRACTION W/PHACO Right 11/11/2014   Procedure: CATARACT EXTRACTION PHACO AND INTRAOCULAR LENS PLACEMENT (IOC);  Surgeon: Sallee Lange, MD;  Location: [facility] ORS;  Service: Ophthalmology;  Laterality: Right;  LOT PACK: [id number] H [place]: 00:29.7 AP: 11.2 CDE: 7.14   CESAREAN SECTION     CHOLECYSTECTOMY     CYSTOSCOPY  03/2008   negative// Dr. Wanda Plump urologist   ENDOMETRIAL BIOPSY  07/1998   negative   EYE SURGERY  12/2008   cataract removal left   HERNIA REPAIR  05/2006   ruptured hernia repair after fall and then two more surgeries within as many weeks for complications(05/2006) Umbilical hernia repair (06/1997)   KNEE ARTHROSCOPY W/ MENISCAL REPAIR     left knne   Family History  Problem Relation Age of Onset   Depression Mother    Heart disease Father     Diabetes Maternal Uncle    Hypertension Maternal Grandmother    Heart disease Paternal Grandmother    Heart disease Paternal Grandfather    Depression Brother        Depression secondary to MVA injuries   Hypertension Brother    Breast cancer Neg Hx    Social History   Socioeconomic History   Marital status: Married    Spouse name: Not on file   Number of children: Not on file   Years of education: Not on file   Highest education level: Not on file  Occupational History   Not on file  Tobacco Use   Smoking status: Never   Smokeless tobacco: Never  Vaping Use  Vaping status: Never Used  Substance and Sexual Activity   Alcohol use: Yes    Comment: twice a year   Drug use: Yes    Types: Marijuana   Sexual activity: Never  Other Topics Concern   Not on file  Social History Narrative   Vision impaired    Social Determinants of Health   Financial Resource Strain: Low Risk  (12/13/2022)   Overall Financial Resource Strain (CARDIA)    Difficulty of Paying Living Expenses: Not hard at all  Food Insecurity: No Food Insecurity (12/13/2022)   Hunger Vital Sign    Worried About Running Out of Food in the Last Year: Never true    Ran Out of Food in the Last Year: Never true  Transportation Needs: No Transportation Needs (12/13/2022)   PRAPARE - Administrator, Civil Service (Medical): No    Lack of Transportation (Non-Medical): No  Physical Activity: Inactive (12/13/2022)   Exercise Vital Sign    Days of Exercise per Week: 0 days    Minutes of Exercise per Session: 0 min  Stress: No Stress Concern Present (12/13/2022)   [employer] of Occupational Health - Occupational Stress Questionnaire    Feeling of Stress : Only a little  Social Connections: Moderately Isolated (12/13/2022)   Social Connection and Isolation Panel [NHANES]    Frequency of Communication with Friends and Family: More than three times a week    Frequency of Social Gatherings with  Friends and Family: Once a week    Attends Religious Services: Never    Database administrator or Organizations: No    Attends Engineer, structural: Never    Marital Status: Married    Tobacco Counseling Counseling given: Not Answered  Clinical Intake:  Pre-visit preparation completed: Yes  Pain : 0-10 Pain Score: 2  Pain Type: Chronic pain Pain Location: Knee (lower back) Pain Orientation: Left Pain Descriptors / Indicators: Aching Pain Onset: More than a month ago Pain Frequency: Intermittent Pain Relieving Factors: medications, extra steps, heat/ice packs  Pain Relieving Factors: medications, extra steps, heat/ice packs  BMI - recorded: 56.64 Nutritional Status: BMI > 30  Obese Nutritional Risks: None Diabetes: No  How often do you need to have someone help you when you read instructions, pamphlets, or other written materials from your doctor or pharmacy?: 5 - Always  Interpreter Needed?: No  Comments: lives with husband Information entered by :: B.Maricela Schreur,LPN   Activities of Daily Living    12/13/2022   11:39 AM  In your present state of health, do you have any difficulty performing the following activities:  Hearing? 0  Vision? 1  Difficulty concentrating or making decisions? 0  Walking or climbing stairs? 1  Dressing or bathing? 0  Doing errands, shopping? 1  Preparing Food and eating ? Y  Using the Toilet? Y  In the past six months, have you accidently leaked urine? N  Do you have problems with loss of bowel control? N  Managing your Medications? Y  Managing your Finances? Y  Housekeeping or managing your Housekeeping? Y    Patient Care Team: Tower, Audrie Gallus, MD as PCP - General Dingeldein, Viviann Spare, MD (Ophthalmology)  Indicate any recent Medical Services you may have received from other than [facility] providers in the past year (date may be approximate).     Assessment:   This is a routine wellness examination for Chamaine.  Hearing/Vision  screen Hearing Screening - Comments:: Pt says hearing is good Vision  Screening - Comments:: Pt says she is legally blind: no peripheral vision and cannot read small print; no night vision   Goals Addressed               This Visit's Progress     COMPLETED: Eat more fruits and vegetables        Starting 11/22/2017, I will attempt to eat 4-5 servings of fresh fruits and vegetables.       Patient Stated   Not on track     Would like to lose weight.      Weight (lb) < 200 lb (90.7 kg) (pt-stated)   290 lb (131.5 kg)     Has to lose weight for knee replacement-keeping goals       Depression Screen    12/13/2022   11:34 AM 11/11/2022   11:09 AM 12/09/2021   11:21 AM 12/03/2020   11:24 AM 12/03/2019   11:18 AM 12/01/2018   10:51 AM 12/01/2018   10:27 AM  PHQ 2/9 Scores  PHQ - 2 Score 0 1 1 0 0 0 0  PHQ- 9 Score  5 1   1      Fall Risk    12/13/2022   11:24 AM 11/11/2022   11:09 AM 12/09/2021   11:25 AM 12/03/2020   11:24 AM 12/03/2019   11:18 AM  Fall Risk   Falls in the past year? 0 0 0 0 0  Number falls in past yr: 0  0 0   Injury with Fall? 0  0 0   Risk for fall due to : No Fall Risks  No Fall Risks Impaired mobility   Follow up Falls prevention discussed;Education provided  Falls prevention discussed;Falls evaluation completed Falls prevention discussed Falls evaluation completed    MEDICARE RISK AT HOME: Medicare Risk at Home Any stairs in or around the home?: Yes If so, are there any without handrails?: Yes Home free of loose throw rugs in walkways, pet beds, electrical cords, etc?: Yes Adequate lighting in your home to reduce risk of falls?: Yes Life alert?: No Use of a cane, walker or w/c?: Yes (cane) Grab bars in the bathroom?: Yes Shower chair or bench in shower?: No Elevated toilet seat or a handicapped toilet?: No  TIMED UP AND GO:  Was the test performed?  No    Cognitive Function:    11/22/2017    8:56 AM 03/25/2016    2:22 PM  MMSE - Mini  Mental State Exam  Orientation to time 5 5  Orientation to Place 5 5  Registration 3 3  Attention/ Calculation 0 0  Recall 3 3  Language- name 2 objects 0 0  Language- repeat 1 1  Language- follow 3 step command 3 0  Language- follow 3 step command-comments  this was not assessed due to pt is legally blind  Language- read & follow direction 0 0  Write a sentence 0 0  Copy design 0 0  Total score 20 17        12/13/2022   11:42 AM 12/09/2021   11:31 AM  6CIT Screen  What Year? 0 points 0 points  What month? 0 points 0 points  What time? 0 points 0 points  Count back from 20 0 points 0 points  Months in reverse 0 points 0 points  Repeat phrase 0 points 0 points  Total Score 0 points 0 points    Immunizations Immunization History  Administered Date(s) Administered   Influenza Inj Mdck  Quad Pf 10/21/2015, 10/19/2017, 10/10/2020   Influenza Split 01/01/2011   Influenza,inj,Quad PF,6+ Mos 10/30/2014, 10/19/2017, 12/01/2018, 12/03/2019, 10/20/2021, 09/28/2022   Influenza-Unspecified 10/27/2012, 10/21/2015, 10/19/2016   Moderna Covid-19 Vaccine Bivalent Booster 55yrs & up 10/10/2020   Moderna Sars-Covid-2 Vaccination 04/11/2019, 05/15/2019, 11/27/2019   PFIZER Comirnaty(Gray Top)Covid-19 Tri-Sucrose Vaccine 10/20/2021   Pfizer(Comirnaty)Fall Seasonal Vaccine 12 years and older 09/28/2022   Pneumococcal Polysaccharide-23 06/30/2011, 12/01/2018   Td 07/28/2004   Tdap 10/30/2014    TDAP status: Up to date  Flu Vaccine status: Up to date  Pneumococcal vaccine status: Up to date  Covid-19 vaccine status: Completed vaccines  Qualifies for Shingles Vaccine? Yes   Zostavax completed No   Shingrix Completed?: No.    Education has been provided regarding the importance of this vaccine. Patient has been advised to call insurance company to determine out of pocket expense if they have not yet received this vaccine. Advised may also receive vaccine at local pharmacy or Health Dept.  Verbalized acceptance and understanding.  Screening Tests Health Maintenance  Topic Date Due   HIV Screening  02/20/2023 (Originally 07/13/1975)   Zoster Vaccines- Shingrix (1 of 2) 03/15/2023 (Originally 07/13/2012)   MAMMOGRAM  02/24/2023   Medicare Annual Wellness (AWV)  12/13/2023   Fecal DNA (Cologuard)  03/29/2024   DTaP/Tdap/Td (3 - Td or Tdap) 10/29/2024   Cervical Cancer Screening (HPV/Pap Cotest)  05/22/2026   INFLUENZA VACCINE  Completed   COVID-19 Vaccine  Completed   Hepatitis C Screening  Completed   HPV VACCINES  Aged Out   Colonoscopy  Discontinued    Health Maintenance  There are no preventive care reminders to display for this patient.   Colorectal cancer screening: Type of screening: Cologuard. Completed 03/29/2021. Repeat every 5-10 years  Mammogram status: Completed 02/24/23. Repeat every year  Lung Cancer Screening: (Low Dose CT Chest recommended if Age 43-80 years, 20 pack-year currently smoking OR have quit w/in 15years.) does not qualify.   Lung Cancer Screening Referral: no  Additional Screening:  Hepatitis C Screening: does not qualify; Completed 10/22/24  Vision Screening: Recommended annual ophthalmology exams for early detection of glaucoma and other disorders of the eye. Is the patient up to date with their annual eye exam?  Yes  Who is the provider or what is the name of the office in which the patient attends annual eye exams? Dr Dellie Burns If pt is not established with a provider, would they like to be referred to a provider to establish care? No .   Dental Screening: Recommended annual dental exams for proper oral hygiene  Diabetic Foot Exam:   Community Resource Referral / Chronic Care Management: CRR required this visit?  No   CCM required this visit?  No    Plan:     I have personally reviewed and noted the following in the patient's chart:   Medical and social history Use of alcohol, tobacco or illicit drugs  Current  medications and supplements including opioid prescriptions. Patient is not currently taking opioid prescriptions. Functional ability and status Nutritional status Physical activity Advanced directives List of other physicians Hospitalizations, surgeries, and ER visits in previous 12 months Vitals Screenings to include cognitive, depression, and falls Referrals and appointments  In addition, I have reviewed and discussed with patient certain preventive protocols, quality metrics, and best practice recommendations. A written personalized care plan for preventive services as well as general preventive health recommendations were provided to patient.    Sue Lush, LPN   16/10/[id number]  After Visit Summary: (MyChart) Due to this being a telephonic visit, the after visit summary with patients personalized plan was offered to patient via MyChart   Nurse Notes: The patient states she is doing well and has no concerns or questions at this time.

## 2022-12-13 NOTE — Patient Instructions (Addendum)
Ms. Galan , Thank you for taking time to come for your Medicare Wellness Visit. I appreciate your ongoing commitment to your health goals. Please review the following plan we discussed and let me know if I can assist you in the future.   Referrals/Orders/Follow-Ups/Clinician Recommendations: none  This is a list of the screening recommended for you and due dates:  Health Maintenance  Topic Date Due   HIV Screening  02/20/2023*   Zoster (Shingles) Vaccine (1 of 2) 03/15/2023*   Mammogram  02/24/2023   Medicare Annual Wellness Visit  12/13/2023   Cologuard (Stool DNA test)  03/29/2024   DTaP/Tdap/Td vaccine (3 - Td or Tdap) 10/29/2024   Pap with HPV screening  05/22/2026   Flu Shot  Completed   COVID-19 Vaccine  Completed   Hepatitis C Screening  Completed   HPV Vaccine  Aged Out   Colon Cancer Screening  Discontinued  *Topic was postponed. The date shown is not the original due date.    Advanced directives: (Copy Requested) Please bring a copy of your health care power of attorney and living will to the office to be added to your chart at your convenience.  Next Medicare Annual Wellness Visit scheduled for next year: Yes 12/14/2023 @ 11:30am telephone

## 2023-01-04 ENCOUNTER — Encounter: Payer: Self-pay | Admitting: Family Medicine

## 2023-01-06 MED ORDER — OMEPRAZOLE 40 MG PO CPDR: DELAYED_RELEASE_CAPSULE | ORAL | 1 refills | Status: DC

## 2023-01-06 MED ORDER — SERTRALINE HCL 50 MG PO TABS: 50.0000 mg | ORAL_TABLET | Freq: Every day | ORAL | 1 refills | Status: DC

## 2023-01-06 MED ORDER — LISINOPRIL 5 MG PO TABS: 5.0000 mg | ORAL_TABLET | Freq: Every day | ORAL | 1 refills | Status: DC

## 2023-01-06 MED ORDER — MONTELUKAST SODIUM 10 MG PO TABS: ORAL_TABLET | ORAL | 1 refills | Status: DC

## 2023-01-07 ENCOUNTER — Other Ambulatory Visit: Payer: Self-pay | Admitting: Family Medicine

## 2023-02-11 ENCOUNTER — Ambulatory Visit: Payer: PPO | Admitting: Family Medicine

## 2023-02-14 ENCOUNTER — Encounter: Payer: Self-pay | Admitting: Family Medicine

## 2023-02-14 ENCOUNTER — Ambulatory Visit (INDEPENDENT_AMBULATORY_CARE_PROVIDER_SITE_OTHER): Payer: PPO | Admitting: Family Medicine

## 2023-02-14 VITALS — BP 124/82 | HR 83 | Temp 98.4°F | Ht 60.0 in | Wt 291.0 lb

## 2023-02-14 DIAGNOSIS — M170 Bilateral primary osteoarthritis of knee: Secondary | ICD-10-CM | POA: Diagnosis not present

## 2023-02-14 DIAGNOSIS — G8929 Other chronic pain: Secondary | ICD-10-CM

## 2023-02-14 DIAGNOSIS — Z6841 Body Mass Index (BMI) 40.0 and over, adult: Secondary | ICD-10-CM | POA: Diagnosis not present

## 2023-02-14 DIAGNOSIS — H3552 Pigmentary retinal dystrophy: Secondary | ICD-10-CM | POA: Diagnosis not present

## 2023-02-14 MED ORDER — HYDROCODONE-ACETAMINOPHEN 5-325 MG PO TABS: 1.0000 | ORAL_TABLET | Freq: Four times a day (QID) | ORAL | 0 refills | Status: DC | PRN

## 2023-02-14 NOTE — Assessment & Plan Note (Signed)
Continues to progress Legally blind  Cannot drive

## 2023-02-14 NOTE — Assessment & Plan Note (Signed)
Doing well with norco 5-325 mg up to every 6 h prn for knee and shoulder arthritis pain #90 per month  No side effects UDS and contract review on 05/12/22, up to date  [place]CCS[place] reviewed with no ref flags today (also takes xanax) Refills done to fill today, on or after 03/17/23, and on or after 04/14/23  for the norco  Continues orthopedic care also- had injections in knee and shoulder planned soon withDr Floyce Stakes (tomorrow)  Enc continued work on [employer] loss/ still struggled with this

## 2023-02-14 NOTE — Progress Notes (Signed)
Subjective:    Patient ID: Monica Madden, female    DOB: 04/07/1960, 63 y.o.   MRN: [id number]  HPI  Wt Readings from Last 3 Encounters:  02/14/23 291 lb (132 kg)  12/13/22 290 lb (131.5 kg)  11/11/22 290 lb 8 oz (131.8 kg)   56.83 kg/m  Vitals:   02/14/23 0907  BP: 124/82  Pulse: 83  Temp: 98.4 F (36.9 C)  SpO2: 99%    Here for encounter for chronic pain management  Also needs handicapped parking form filled out for vision loss and arthritis    Indication for chronic opioid  arthritis pain knees/shoulders  Medication and dose: norco 5-325 mg Q 6 h prn  # pills per month: 90 Last UDS date: 05/12/22 low risk  Opioid Treatment Agreement signed (Y/N): yes Opioid Treatment Agreement last reviewed with patient:  05/12/22 NCCSRS reviewed this encounter (include red flags): today, no red flags   Last refill   01/13/23    Also takes alprazolam / refilled same day  Sees dr Floyce Stakes for ortho care  Has injection planned tomorrow - shoulder and knee    Cannot have surgery for knee repl until she looses weight   Morbid obesity    Last annual exam was 03/17/22    Trying to eat healthy  Harder around the holidays  More sweets  Back to normal now      Patient Active Problem List   Diagnosis Date Noted   Colon cancer screening 03/17/2021   Paresthesia of both hands 08/06/2020   Medicare annual wellness visit, subsequent 12/01/2018   Encounter for chronic pain management 02/07/2017   Chronic pain 11/17/2015   Elevated glucose level 11/09/2015   Mild intermittent asthma without complication 10/21/2015   Osteoarthritis of knee 07/18/2013   Routine general medical examination at a health care facility 06/30/2011   Amenorrhea 06/30/2011   IRRITABLE BOWEL SYNDROME 08/07/2009   KNEE PAIN, BILATERAL 08/07/2009   Pain in joint involving lower leg 08/07/2009   Constipation 01/15/2009   LEG PAIN, LEFT 10/09/2008   Hyperlipidemia 05/09/2008   Essential hypertension  03/29/2008   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 06/05/2007   Incisional hernia 05/12/2007   FREQUENCY, URINARY 05/12/2007   DEPENDENT EDEMA, LEGS 11/03/2006   Morbid obesity (HCC) 07/15/2006   Dysthymic disorder 07/15/2006   Migraine headache 07/15/2006   RETINITIS PIGMENTOSA 07/15/2006   VARICOSE VEINS, LOWER EXTREMITIES 07/15/2006   Allergic rhinitis 07/15/2006   GERD 07/15/2006   BACK PAIN, LUMBAR, CHRONIC 07/15/2006   INCONTINENCE, URGE 07/15/2006   Asthma 07/15/2006   Past Medical History:  Diagnosis Date   Allergy    allergic rhinitis   Anemia    Anxiety    ATTACKS   Arthritis    Asthma    Cataract    left cataract removal 12/2008   Chronic back pain    OA of spine   Depression    GERD (gastroesophageal reflux disease)    Endo negative 02/2000   Hypertension 03/2008   IBS (irritable bowel syndrome)    MRSA infection 2008   Right leg   Obesity    Ovarian cyst    Retinitis pigmentosa    blind   Past Surgical History:  Procedure Laterality Date   APPENDECTOMY     BLADDER SURGERY  1968   age 49 ? dilitation   CATARACT EXTRACTION W/PHACO Right 11/11/2014   Procedure: CATARACT EXTRACTION PHACO AND INTRAOCULAR LENS PLACEMENT (IOC);  Surgeon: Sallee Lange, MD;  Location: [facility] ORS;  Service: Ophthalmology;  Laterality: Right;  LOT PACK: [id number] H [place]: 00:29.7 AP: 11.2 CDE: 7.14   CESAREAN SECTION     CHOLECYSTECTOMY     CYSTOSCOPY  03/2008   negative// Dr. Wanda Plump urologist   ENDOMETRIAL BIOPSY  07/1998   negative   EYE SURGERY  12/2008   cataract removal left   HERNIA REPAIR  05/2006   ruptured hernia repair after fall and then two more surgeries within as many weeks for complications(05/2006) Umbilical hernia repair (06/1997)   KNEE ARTHROSCOPY W/ MENISCAL REPAIR     left knne   Social History   Tobacco Use   Smoking status: Never   Smokeless tobacco: Never  Vaping Use   Vaping status: Never Used  Substance Use Topics   Alcohol  use: Yes    Comment: twice a year   Drug use: Yes    Types: Marijuana   Family History  Problem Relation Age of Onset   Depression Mother    Heart disease Father    Diabetes Maternal Uncle    Hypertension Maternal Grandmother    Heart disease Paternal Grandmother    Heart disease Paternal Grandfather    Depression Brother        Depression secondary to MVA injuries   Hypertension Brother    Breast cancer Neg Hx    Allergies  Allergen Reactions   Diclofenac Sodium     REACTION: allergic/ made asthma bad   Etodolac     REACTION: GI   Metronidazole     REACTION: GI side eff   Nsaids Other (See Comments)    GI Upset   Oxybutynin Itching   Rosuvastatin     Myalgias    Sulfamethoxazole-Trimethoprim    Tramadol Other (See Comments)    Caused elevated BP   Sulfa Antibiotics Itching and Rash   Current Outpatient Medications on File Prior to Visit  Medication Sig Dispense Refill   albuterol (PROAIR HFA) 108 (90 Base) MCG/ACT inhaler Inhale 2 puffs into the lungs every 6 (six) hours as needed for wheezing or shortness of breath. 1 each 5   ALPRAZolam (XANAX) 1 MG tablet TAKE 1 TABLET(1 MG) BY MOUTH THREE TIMES DAILY AS NEEDED 90 tablet 3   Ascorbic Acid (VITAMIN C) 1000 MG tablet Take 1,000 mg by mouth daily.  Vitamin C with Rosehips     Bee Pollen 500 MG CHEW Chew 500 mg by mouth in the morning and at bedtime.     BLACK ELDERBERRY PO Take 1,150 mg by mouth daily.     buPROPion (WELLBUTRIN XL) 300 MG 24 hr tablet Take 1 tablet (300 mg total) by mouth daily. 90 tablet 3   Calcium Carbonate-Vitamin D 600-400 MG-UNIT tablet Take 1 tablet by mouth daily.     Coenzyme Q10 (CO Q 10) 100 MG CAPS Take 1 Capful by mouth daily.     Cranberry-Vitamin C-Probiotic (AZO CRANBERRY PO) Take 500 mg by mouth every other day.     cyclobenzaprine (FLEXERIL) 10 MG tablet Take 1 tablet (10 mg total) by mouth 3 (three) times daily as needed for muscle spasms. Caution of sedation 30 tablet 3    ezetimibe (ZETIA) 10 MG tablet Take 1 tablet (10 mg total) by mouth daily. 90 tablet 1   fexofenadine (ALLERGY RELIEF) 180 MG tablet Take 1 tablet (180 mg total) by mouth daily. 90 tablet 1   fluticasone (FLONASE) 50 MCG/ACT nasal spray SHAKE LIQUID AND USE 2 SPRAYS IN EACH NOSTRIL TWICE  DAILY 48 g 1   lisinopril (ZESTRIL) 5 MG tablet Take 1 tablet (5 mg total) by mouth daily. 90 tablet 1   montelukast (SINGULAIR) 10 MG tablet TAKE 1 TABLET BY MOUTH EVERY DAY 90 tablet 1   Omega 3-6-9 Fatty Acids (OMEGA 3-6-9 COMPLEX PO) Take one tablet by mouth daily     omeprazole (PRILOSEC) 40 MG capsule TAKE 1 CAPSULE BY MOUTH EVERY DAY 90 capsule 1   OVER THE COUNTER MEDICATION daily as needed. HYLAND'S CELL SALTS #8 MAGNESIA PHOSPHORICA     OVER THE COUNTER MEDICATION Take 10 mg by mouth daily. VITAL PROTEINS COLLAGEN PEPTIDES     sertraline (ZOLOFT) 50 MG tablet Take 1 tablet (50 mg total) by mouth daily. 90 tablet 1   tolterodine (DETROL LA) 4 MG 24 hr capsule TAKE 1 CAPSULE(4 MG) BY MOUTH DAILY AS NEEDED 90 capsule 3   No current facility-administered medications on file prior to visit.    Review of Systems  Constitutional:  Negative for activity change, appetite change, fatigue, fever and unexpected weight change.  HENT:  Negative for congestion, ear pain, rhinorrhea, sinus pressure and sore throat.   Eyes:  Positive for visual disturbance. Negative for pain and redness.       Legally blind  Respiratory:  Negative for cough, shortness of breath and wheezing.   Cardiovascular:  Negative for chest pain and palpitations.  Gastrointestinal:  Negative for abdominal pain, blood in stool, constipation and diarrhea.  Endocrine: Negative for polydipsia and polyuria.  Genitourinary:  Negative for dysuria, frequency and urgency.  Musculoskeletal:  Positive for arthralgias. Negative for back pain and myalgias.       Knees and shoulders  Limited rom  Skin:  Negative for pallor and rash.   Allergic/Immunologic: Negative for environmental allergies.  Neurological:  Negative for dizziness, syncope and headaches.  Hematological:  Negative for adenopathy. Does not bruise/bleed easily.  Psychiatric/Behavioral:  Negative for decreased concentration and dysphoric mood. The patient is not nervous/anxious.        Objective:   Physical Exam Constitutional:      General: She is not in acute distress.    Appearance: Normal appearance. She is well-developed. She is obese. She is not ill-appearing or diaphoretic.  HENT:     Head: Normocephalic and atraumatic.  Eyes:     Conjunctiva/sclera: Conjunctivae normal.     Pupils: Pupils are equal, round, and reactive to light.  Neck:     Thyroid: No thyromegaly.     Vascular: No carotid bruit or JVD.  Cardiovascular:     Rate and Rhythm: Normal rate and regular rhythm.     Heart sounds: Normal heart sounds.     No gallop.  Pulmonary:     Effort: Pulmonary effort is normal. No respiratory distress.     Breath sounds: Normal breath sounds. No wheezing or rales.  Abdominal:     General: There is no distension or abdominal bruit.     Palpations: Abdomen is soft.  Musculoskeletal:     Cervical back: Normal range of motion and neck supple.     Right lower leg: No edema.     Left lower leg: No edema.     Comments: Limited rom knees and shoulders  No ankle swelling  No deformities of hands   Uses a cane for ambulation  Lymphadenopathy:     Cervical: No cervical adenopathy.  Skin:    General: Skin is warm and dry.     Coloration: Skin is  not pale.     Findings: No rash.  Neurological:     Mental Status: She is alert.     Coordination: Coordination normal.     Deep Tendon Reflexes: Reflexes are normal and symmetric. Reflexes normal.  Psychiatric:        Mood and Affect: Mood normal.           Assessment & Plan:   Problem List Items Addressed This Visit       Musculoskeletal and Integument   Osteoarthritis of knee    Continues ortho care from Dr Floyce Stakes For appointment and injection tomorrow   If she could loose weight would consider knee replacement which she really needs       Relevant Medications   HYDROcodone-acetaminophen (NORCO) 5-325 MG tablet     Other   RETINITIS PIGMENTOSA   Continues to progress Legally blind  Cannot drive       Morbid obesity (HCC)   Discussed how this problem influences overall health and the risks it imposes  Reviewed plan for weight loss with lower calorie diet (via better food choices (lower glycemic and portion control) along with exercise building up to or more than 30 minutes 5 days per week including some aerobic activity and strength training   Limited by vision issues and transportation problems in terms of exercise  Ate poorly over holidays Getting back on track   Would be a potential candidate for GLP-1 if ever covered by ins       Encounter for chronic pain management - Primary   Doing well with norco 5-325 mg up to every 6 h prn for knee and shoulder arthritis pain #90 per month  No side effects UDS and contract review on 05/12/22, up to date  NCCSRS reviewed with no ref flags today (also takes xanax) Refills done to fill today, on or after 03/17/23, and on or after 04/14/23  for the norco  Continues orthopedic care also- had injections in knee and shoulder planned soon withDr Floyce Stakes (tomorrow)  Enc continued work on [employer] loss/ still struggled with this       Chronic pain   Encouraged for chronic pain management today  Norco  Knees and shoulders  Causes mobility impairment (along with blindness)   Handout for handicapped parking completed today      Relevant Medications   HYDROcodone-acetaminophen (NORCO) 5-325 MG tablet

## 2023-02-14 NOTE — Assessment & Plan Note (Addendum)
Encouraged for chronic pain management today  Norco  Knees and shoulders  Causes mobility impairment (along with blindness)   Handout for handicapped parking completed today

## 2023-02-14 NOTE — Patient Instructions (Addendum)
Take care of yourself   Work on weight loss the best you can Try to get most of your carbohydrates from produce (with the exception of white potatoes) and whole grains Eat less bread/pasta/rice/snack foods/cereals/sweets and other items from the middle of the grocery store (processed carbs)  I sent norco Let us know if any problems

## 2023-02-14 NOTE — Assessment & Plan Note (Signed)
Continues ortho care from Dr Floyce Stakes For appointment and injection tomorrow   If she could loose weight would consider knee replacement which she really needs

## 2023-02-14 NOTE — Assessment & Plan Note (Signed)
Discussed how this problem influences overall health and the risks it imposes  Reviewed plan for weight loss with lower calorie diet (via better food choices (lower glycemic and portion control) along with exercise building up to or more than 30 minutes 5 days per week including some aerobic activity and strength training   Limited by vision issues and transportation problems in terms of exercise  Ate poorly over holidays Getting back on track   Would be a potential candidate for GLP-1 if ever covered by ins

## 2023-02-15 DIAGNOSIS — M1712 Unilateral primary osteoarthritis, left knee: Secondary | ICD-10-CM | POA: Diagnosis not present

## 2023-02-15 DIAGNOSIS — M7581 Other shoulder lesions, right shoulder: Secondary | ICD-10-CM | POA: Diagnosis not present

## 2023-02-27 ENCOUNTER — Telehealth: Payer: Self-pay | Admitting: Family Medicine

## 2023-02-27 DIAGNOSIS — R7309 Other abnormal glucose: Secondary | ICD-10-CM

## 2023-02-27 DIAGNOSIS — E7800 Pure hypercholesterolemia, unspecified: Secondary | ICD-10-CM

## 2023-02-27 DIAGNOSIS — I10 Essential (primary) hypertension: Secondary | ICD-10-CM

## 2023-02-27 DIAGNOSIS — Z79899 Other long term (current) drug therapy: Secondary | ICD-10-CM | POA: Insufficient documentation

## 2023-02-27 NOTE — Telephone Encounter (Signed)
-----   Message from Gerry Krone sent at 02/23/2023  2:35 PM EST ----- Regarding: Lab orders for Mon, 2.10.25 Patient is scheduled for CPX labs, please order future labs, Thanks , Anselmo Kings

## 2023-03-14 ENCOUNTER — Other Ambulatory Visit (INDEPENDENT_AMBULATORY_CARE_PROVIDER_SITE_OTHER): Payer: PPO

## 2023-03-14 DIAGNOSIS — Z79899 Other long term (current) drug therapy: Secondary | ICD-10-CM

## 2023-03-14 DIAGNOSIS — E7800 Pure hypercholesterolemia, unspecified: Secondary | ICD-10-CM

## 2023-03-14 DIAGNOSIS — R7309 Other abnormal glucose: Secondary | ICD-10-CM

## 2023-03-14 DIAGNOSIS — I10 Essential (primary) hypertension: Secondary | ICD-10-CM

## 2023-03-14 LAB — COMPREHENSIVE METABOLIC PANEL
ALT: 12 U/L (ref 0–35)
AST: 11 U/L (ref 0–37)
Albumin: 4.4 g/dL (ref 3.5–5.2)
Alkaline Phosphatase: 64 U/L (ref 39–117)
BUN: 24 mg/dL — ABNORMAL HIGH (ref 6–23)
CO2: 30 meq/L (ref 19–32)
Calcium: 9.6 mg/dL (ref 8.4–10.5)
Chloride: 99 meq/L (ref 96–112)
Creatinine, Ser: 0.88 mg/dL (ref 0.40–1.20)
GFR: 71.31 mL/min (ref >60.00)
Glucose, Bld: 94 mg/dL (ref 70–99)
Potassium: 4.4 meq/L (ref 3.5–5.1)
Sodium: 138 meq/L (ref 135–145)
Total Bilirubin: 0.7 mg/dL (ref 0.2–1.2)
Total Protein: 6.9 g/dL (ref 6.0–8.3)

## 2023-03-14 LAB — LIPID PANEL
Cholesterol: 191 mg/dL (ref 0–200)
HDL: 59.7 mg/dL (ref >39.00)
LDL Cholesterol: 103 mg/dL — ABNORMAL HIGH (ref 0–99)
NonHDL: 131.47
Total CHOL/HDL Ratio: 3
Triglycerides: 143 mg/dL (ref 0.0–149.0)
VLDL: 28.6 mg/dL (ref 0.0–40.0)

## 2023-03-14 LAB — CBC WITH DIFFERENTIAL/PLATELET
Basophils Absolute: 0.1 10*3/uL (ref 0.0–0.1)
Basophils Relative: 0.7 % (ref 0.0–3.0)
Eosinophils Absolute: 0.2 10*3/uL (ref 0.0–0.7)
Eosinophils Relative: 2.1 % (ref 0.0–5.0)
HCT: 40.8 % (ref 36.0–46.0)
Hemoglobin: 13.6 g/dL (ref 12.0–15.0)
Lymphocytes Relative: 19.5 % (ref 12.0–46.0)
Lymphs Abs: 1.6 10*3/uL (ref 0.7–4.0)
MCHC: 33.3 g/dL (ref 30.0–36.0)
MCV: 90.2 fl (ref 78.0–100.0)
Monocytes Absolute: 0.5 10*3/uL (ref 0.1–1.0)
Monocytes Relative: 6.6 % (ref 3.0–12.0)
Neutro Abs: 5.8 10*3/uL (ref 1.4–7.7)
Neutrophils Relative %: 71.1 % (ref 43.0–77.0)
Platelets: 262 10*3/uL (ref 150.0–400.0)
RBC: 4.53 Mil/uL (ref 3.87–5.11)
RDW: 13.9 % (ref 11.5–15.5)
WBC: 8.1 10*3/uL (ref 4.0–10.5)

## 2023-03-14 LAB — HEMOGLOBIN A1C: Hgb A1c MFr Bld: 5.8 % (ref 4.6–6.5)

## 2023-03-14 LAB — VITAMIN B12: Vitamin B-12: 144 pg/mL — ABNORMAL LOW (ref 211–911)

## 2023-03-14 LAB — TSH: TSH: 1.92 u[IU]/mL (ref 0.35–5.50)

## 2023-03-15 ENCOUNTER — Other Ambulatory Visit: Payer: Self-pay | Admitting: Family Medicine

## 2023-03-16 NOTE — Telephone Encounter (Signed)
 Name of Medication: Xanax Name of Pharmacy: [facility] [address] [address] Last Fill or Written Date and Quantity: 11/11/22 #90 tabs/ 3 refills  Last Office Visit and Type: pain med f/u 02/14/23 Next Office Visit and Type: CPE 03/21/23 Last Controlled Substance Agreement Date: 05/12/22 Last UDS:05/12/22

## 2023-03-21 ENCOUNTER — Ambulatory Visit (INDEPENDENT_AMBULATORY_CARE_PROVIDER_SITE_OTHER): Payer: PPO | Admitting: Family Medicine

## 2023-03-21 ENCOUNTER — Encounter: Payer: Self-pay | Admitting: Family Medicine

## 2023-03-21 ENCOUNTER — Other Ambulatory Visit: Payer: Self-pay | Admitting: Family Medicine

## 2023-03-21 VITALS — BP 128/70 | HR 74 | Temp 98.3°F | Ht 61.0 in | Wt 288.4 lb

## 2023-03-21 DIAGNOSIS — B079 Viral wart, unspecified: Secondary | ICD-10-CM | POA: Insufficient documentation

## 2023-03-21 DIAGNOSIS — Z0000 Encounter for general adult medical examination without abnormal findings: Secondary | ICD-10-CM | POA: Diagnosis not present

## 2023-03-21 DIAGNOSIS — R7303 Prediabetes: Secondary | ICD-10-CM

## 2023-03-21 DIAGNOSIS — F341 Dysthymic disorder: Secondary | ICD-10-CM

## 2023-03-21 DIAGNOSIS — I10 Essential (primary) hypertension: Secondary | ICD-10-CM | POA: Diagnosis not present

## 2023-03-21 DIAGNOSIS — K219 Gastro-esophageal reflux disease without esophagitis: Secondary | ICD-10-CM

## 2023-03-21 DIAGNOSIS — E538 Deficiency of other specified B group vitamins: Secondary | ICD-10-CM

## 2023-03-21 DIAGNOSIS — L989 Disorder of the skin and subcutaneous tissue, unspecified: Secondary | ICD-10-CM | POA: Insufficient documentation

## 2023-03-21 DIAGNOSIS — J4520 Mild intermittent asthma, uncomplicated: Secondary | ICD-10-CM

## 2023-03-21 DIAGNOSIS — E7800 Pure hypercholesterolemia, unspecified: Secondary | ICD-10-CM

## 2023-03-21 DIAGNOSIS — H3552 Pigmentary retinal dystrophy: Secondary | ICD-10-CM | POA: Diagnosis not present

## 2023-03-21 DIAGNOSIS — Z79899 Other long term (current) drug therapy: Secondary | ICD-10-CM | POA: Diagnosis not present

## 2023-03-21 DIAGNOSIS — Z1211 Encounter for screening for malignant neoplasm of colon: Secondary | ICD-10-CM

## 2023-03-21 DIAGNOSIS — Z1231 Encounter for screening mammogram for malignant neoplasm of breast: Secondary | ICD-10-CM

## 2023-03-21 MED ORDER — CYANOCOBALAMIN 1000 MCG/ML IJ SOLN
1000.0000 ug | Freq: Once | INTRAMUSCULAR | Status: AC
  Administered 2023-03-21: 1000 ug via INTRAMUSCULAR

## 2023-03-21 NOTE — Assessment & Plan Note (Signed)
 Legally blind at this point

## 2023-03-21 NOTE — Assessment & Plan Note (Signed)
 Discussed how this problem influences overall health and the risks it imposes  Reviewed plan for weight loss with lower calorie diet (via better food choices (lower glycemic and portion control) along with exercise building up to or more than 30 minutes 5 days per week including some aerobic activity and strength training   Limited by vision issues and transportation problems in terms of exercise  Ate poorly over holidays Getting back on track  Using some 3 lb arm weights now / encouraged to add core training if able in chair program  Would be a potential candidate for GLP-1 if ever covered by ins

## 2023-03-21 NOTE — Assessment & Plan Note (Signed)
 Lab Results  Component Value Date   HGBA1C 5.8 03/14/2023   HGBA1C 5.9 03/10/2022   HGBA1C 5.9 03/10/2021   disc imp of low glycemic diet and wt loss to prevent DM2

## 2023-03-21 NOTE — Assessment & Plan Note (Signed)
 Mid back  [place] with darker spot in middle Irreg shape Ref to derm

## 2023-03-21 NOTE — Assessment & Plan Note (Signed)
 New Lab Results  Component Value Date   VITAMINB12 144 (L) 03/14/2023   On ppi   Shot today  Will start B12 1000 mcg daily Re check at visit in April

## 2023-03-21 NOTE — Assessment & Plan Note (Signed)
Cologuard negative 03/2021  This is good for 3 years

## 2023-03-21 NOTE — Patient Instructions (Addendum)
 If you are interested in the new shingles vaccine (Shingrix) - call your local pharmacy to check on coverage and availability   Be as active as you can  Add some strength training to your routine, this is important for bone and brain health and can reduce your risk of falls and help your body use insulin properly and regulate weight  Light weights, exercise bands , and internet videos are a good way to start  Yoga (chair or regular), machines , floor exercises or a gym with machines are also good options   You have vitamin B12 deficiency  We will give you a shot today  Get vitamin B12 over the counter and take 1000 mcg every day   I want to check B12 level in 2 months  Let's check the level at next visit in April   For prediabetes Try to get most of your carbohydrates from produce (with the exception of white potatoes) and whole grains Eat less bread/pasta/rice/snack foods/cereals/sweets and other items from the middle of the grocery store (processed carbs)  I put the referral in for dermatology [facility])  Please let us know if you don't hear in 1-2 weeks     Get your mammogram scheduled  You have an order for:  []   2D Mammogram  [x]   3D Mammogram  []   Bone Density     Please call for appointment:   [x]   [facility]  [address] [place] [address]  [phone]  []   [facility])   [address]  [place], [place] [address]  [phone]  []   [facility]      [address] [place], [place]        [phone]         []   [facility]  [address] [place], [place]  [phone]  []  [facility]   [address]   [place], [place] [address]  [phone]  []  [facility]  [address] [place], [place]  [address] [phone]    Make sure to wear two piece clothing  No lotions powders or deodorants the day of the appointment Make sure to bring picture ID and insurance card.  Bring list of medications you are currently taking including any supplements.   Schedule your screening mammogram through MyChart!   [facility] [place] imaging sites can now be scheduled through MyChart.  Log into your MyChart account.  Go to 'Visit' (or 'Appointments' if  on mobile App) --> Schedule an  Appointment  Under 'Select a Reason for Visit' choose the Mammogram  Screening option.  Complete the pre-visit questions  and select the time and place that  best fits your schedule

## 2023-03-21 NOTE — Assessment & Plan Note (Signed)
 bp in fair control at this time  BP Readings from Last 1 Encounters:  03/21/23 128/70   No changes needed Most recent labs reviewed  Disc lifstyle change with low sodium diet and exercise  Plan to continue lisinopril 5 mg daily

## 2023-03-21 NOTE — Assessment & Plan Note (Signed)
 Does well with Singulair 10 mg daily Normal lung exam today

## 2023-03-21 NOTE — Assessment & Plan Note (Signed)
 Continues omeprazole 40 mg daily  Cannot go without it  Weight loss end  Avoid triggers if able B12 is low-will work on replacement

## 2023-03-21 NOTE — Progress Notes (Signed)
 Subjective:    Patient ID: Monica Madden, female    DOB: 1960/02/02, 63 y.o.   MRN: [id number]  HPI  Here for health maintenance exam and to review chronic medical problems   Wt Readings from Last 3 Encounters:  03/21/23 288 lb 6 oz (130.8 kg)  02/14/23 291 lb (132 kg)  12/13/22 290 lb (131.5 kg)   54.49 kg/m  Vitals:   03/21/23 0944 03/21/23 1020  BP: (!) 146/76 128/70  Pulse: 74   Temp: 98.3 F (36.8 C)   SpO2: 97%     Immunization History  Administered Date(s) Administered   Influenza Inj Mdck Quad Pf 10/21/2015, 10/19/2017, 10/10/2020   Influenza Split 01/01/2011   Influenza,inj,Quad PF,6+ Mos 10/30/2014, 10/19/2017, 12/01/2018, 12/03/2019, 10/20/2021, 09/28/2022   Influenza-Unspecified 10/27/2012, 10/21/2015, 10/19/2016   Moderna Covid-19 Vaccine Bivalent Booster 50yrs & up 10/10/2020   Moderna Sars-Covid-2 Vaccination 04/11/2019, 05/15/2019, 11/27/2019   PFIZER Comirnaty(Gray Top)Covid-19 Tri-Sucrose Vaccine 10/20/2021   Pfizer(Comirnaty)Fall Seasonal Vaccine 12 years and older 09/28/2022   Pneumococcal Polysaccharide-23 06/30/2011, 12/01/2018   Td 07/28/2004   Tdap 10/30/2014    Health Maintenance Due  Topic Date Due   Pneumococcal Vaccine 52-16 Years old (2 of 2 - PCV) 12/01/2019   MAMMOGRAM  02/24/2023   Pna vaccine -due in November   Shingrix - interested   Mammogram 02/2022  Self breast exam  Gyn health No problems    Colon cancer screening  Cologuard neg 02/2021   Bone health   Falls- none  Fractures-none  Supplements ca with vit D    Exercise  Walks around when she can/after she gets an injection  Doing some walking in house as tolerated  Using 3 lb hand weights   Mood    02/14/2023    9:07 AM 02/14/2023    9:06 AM 12/13/2022   11:34 AM 11/11/2022   11:09 AM 12/09/2021   11:21 AM  Depression screen PHQ 2/9  Decreased Interest 1 0 0 0 0  Down, Depressed, Hopeless 1 0 0 1 1  PHQ - 2 Score 2 0 0 1 1  Altered sleeping 1   2 0   Tired, decreased energy 1   1 0  Change in appetite 1   1 0  Feeling bad or failure about yourself  1   0 0  Trouble concentrating 1   0 0  Moving slowly or fidgety/restless 1   0 0  Suicidal thoughts 0   0 0  PHQ-9 Score 8   5 1   Difficult doing work/chores Not difficult at all    Not difficult at all   Dysthymic disorder - stable  Wellbutrin xl 150 mg daily -jittery if she takes more  Sertraline 50 mg daily  Xanax -long term for anxiety    HTN bp is stable today  No cp or palpitations or headaches or edema  No side effects to medicines  BP Readings from Last 3 Encounters:  03/21/23 128/70  02/14/23 124/82  11/11/22 118/66    Lisinopril 5 mg daily    Lab Results  Component Value Date   NA 138 03/14/2023   K 4.4 03/14/2023   CO2 30 03/14/2023   GLUCOSE 94 03/14/2023   BUN 24 (H) 03/14/2023   CREATININE 0.88 03/14/2023   CALCIUM 9.6 03/14/2023   GFR 71.31 03/14/2023   GFRNONAA 100.19 09/26/2009    GERD Omeprazole 40 mg daily -works well   Lab Results  Component Value Date   VITAMINB12 144 (  L) 03/14/2023  New No supplementation   Glucose Lab Results  Component Value Date   HGBA1C 5.8 03/14/2023   HGBA1C 5.9 03/10/2022   HGBA1C 5.9 03/10/2021  Has a sweet tooth Is hard to eat well at times  Tries to sub fruit   Allergies/asthma  Singulair  Flonase Fexofenadine   Hyperlipidemia Lab Results  Component Value Date   CHOL 191 03/14/2023   CHOL 195 05/12/2022   CHOL 214 (H) 03/10/2022   Lab Results  Component Value Date   HDL 59.70 03/14/2023   HDL 44.70 05/12/2022   HDL 46.20 03/10/2022   Lab Results  Component Value Date   LDLCALC 103 (H) 03/14/2023   LDLCALC 133 (H) 03/10/2022   LDLCALC 75 03/10/2021   Lab Results  Component Value Date   TRIG 143.0 03/14/2023   TRIG 203.0 (H) 05/12/2022   TRIG 176.0 (H) 03/10/2022   Lab Results  Component Value Date   CHOLHDL 3 03/14/2023   CHOLHDL 4 05/12/2022   CHOLHDL 5 03/10/2022   Lab  Results  Component Value Date   LDLDIRECT 136.0 05/12/2022   LDLDIRECT 140.0 11/24/2018   LDLDIRECT 135.0 11/22/2017   Intol of statins  Takes zetia 10 mg daily  Working on diet - better   Lab Results  Component Value Date   ALT 12 03/14/2023   AST 11 03/14/2023   ALKPHOS 64 03/14/2023   BILITOT 0.7 03/14/2023   Lab Results  Component Value Date   WBC 8.1 03/14/2023   HGB 13.6 03/14/2023   HCT 40.8 03/14/2023   MCV 90.2 03/14/2023   PLT 262.0 03/14/2023   Lab Results  Component Value Date   TSH 1.92 03/14/2023      Patient Active Problem List   Diagnosis Date Noted   Encounter for screening mammogram for breast cancer 03/21/2023   Vitamin B12 deficiency 03/21/2023   Skin lesion 03/21/2023   Wart of hand 03/21/2023   Current use of proton pump inhibitor 02/27/2023   Colon cancer screening 03/17/2021   Paresthesia of both hands 08/06/2020   Medicare annual wellness visit, subsequent 12/01/2018   Encounter for chronic pain management 02/07/2017   Chronic pain 11/17/2015   Prediabetes 11/09/2015   Osteoarthritis of knee 07/18/2013   Routine general medical examination at a health care facility 06/30/2011   Amenorrhea 06/30/2011   IRRITABLE BOWEL SYNDROME 08/07/2009   KNEE PAIN, BILATERAL 08/07/2009   Pain in joint involving lower leg 08/07/2009   LEG PAIN, LEFT 10/09/2008   Hyperlipidemia 05/09/2008   Essential hypertension 03/29/2008   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 06/05/2007   Incisional hernia 05/12/2007   FREQUENCY, URINARY 05/12/2007   DEPENDENT EDEMA, LEGS 11/03/2006   Morbid obesity (HCC) 07/15/2006   Dysthymic disorder 07/15/2006   Migraine headache 07/15/2006   RETINITIS PIGMENTOSA 07/15/2006   VARICOSE VEINS, LOWER EXTREMITIES 07/15/2006   Allergic rhinitis 07/15/2006   GERD 07/15/2006   BACK PAIN, LUMBAR, CHRONIC 07/15/2006   INCONTINENCE, URGE 07/15/2006   Asthma 07/15/2006   Past Medical History:  Diagnosis Date    Allergy    allergic rhinitis   Anemia    Anxiety    ATTACKS   Arthritis    Asthma    Cataract    left cataract removal 12/2008   Chronic back pain    OA of spine   Depression    GERD (gastroesophageal reflux disease)    Endo negative 02/2000   Hypertension 03/2008   IBS (irritable bowel syndrome)  MRSA infection 2008   Right leg   Obesity    Ovarian cyst    Retinitis pigmentosa    blind   Past Surgical History:  Procedure Laterality Date   APPENDECTOMY     BLADDER SURGERY  1968   age 45 ? dilitation   CATARACT EXTRACTION W/PHACO Right 11/11/2014   Procedure: CATARACT EXTRACTION PHACO AND INTRAOCULAR LENS PLACEMENT (IOC);  Surgeon: Sallee Lange, MD;  Location: [facility] ORS;  Service: Ophthalmology;  Laterality: Right;  LOT PACK: [id number] [place]: 00:29.7 AP: 11.2 CDE: 7.14   CESAREAN SECTION     CHOLECYSTECTOMY     CYSTOSCOPY  03/2008   negative// Dr. Wanda Plump urologist   ENDOMETRIAL BIOPSY  07/1998   negative   EYE SURGERY  12/2008   cataract removal left   HERNIA REPAIR  05/2006   ruptured hernia repair after fall and then two more surgeries within as many weeks for complications(05/2006) Umbilical hernia repair (06/1997)   KNEE ARTHROSCOPY W/ MENISCAL REPAIR     left knne   Social History   Tobacco Use   Smoking status: Never   Smokeless tobacco: Never  Vaping Use   Vaping status: Never Used  Substance Use Topics   Alcohol use: Yes    Comment: twice a year   Drug use: Yes    Types: Marijuana   Family History  Problem Relation Age of Onset   Depression Mother    Heart disease Father    Diabetes Maternal Uncle    Hypertension Maternal Grandmother    Heart disease Paternal Grandmother    Heart disease Paternal Grandfather    Depression Brother        Depression secondary to MVA injuries   Hypertension Brother    Breast cancer Neg Hx    Allergies  Allergen Reactions   Diclofenac Sodium     REACTION: allergic/ made asthma bad   Etodolac      REACTION: GI   Metronidazole     REACTION: GI side eff   Nsaids Other (See Comments)    GI Upset   Oxybutynin Itching   Rosuvastatin     Myalgias    Sulfamethoxazole-Trimethoprim    Tramadol Other (See Comments)    Caused elevated BP   Sulfa Antibiotics Itching and Rash   Current Outpatient Medications on File Prior to Visit  Medication Sig Dispense Refill   albuterol (PROAIR HFA) 108 (90 Base) MCG/ACT inhaler Inhale 2 puffs into the lungs every 6 (six) hours as needed for wheezing or shortness of breath. 1 each 5   ALPRAZolam (XANAX) 1 MG tablet TAKE 1 TABLET(1 MG) BY MOUTH THREE TIMES DAILY AS NEEDED 90 tablet 3   Ascorbic Acid (VITAMIN C) 1000 MG tablet Take 1,000 mg by mouth daily.  Vitamin C with Rosehips     Bee Pollen 500 MG CHEW Chew 500 mg by mouth in the morning and at bedtime.     BLACK ELDERBERRY PO Take 1,150 mg by mouth daily.     buPROPion (WELLBUTRIN XL) 300 MG 24 hr tablet Take 1 tablet (300 mg total) by mouth daily. 90 tablet 3   Calcium Carbonate-Vitamin D 600-400 MG-UNIT tablet Take 1 tablet by mouth daily.     Coenzyme Q10 (CO Q 10) 100 MG CAPS Take 1 Capful by mouth daily.     Cranberry-Vitamin C-Probiotic (AZO CRANBERRY PO) Take 500 mg by mouth every other day.     cyanocobalamin (VITAMIN B12) 1000 MCG tablet Take 1,000 mcg by mouth daily.  cyclobenzaprine (FLEXERIL) 10 MG tablet Take 1 tablet (10 mg total) by mouth 3 (three) times daily as needed for muscle spasms. Caution of sedation 30 tablet 3   ezetimibe (ZETIA) 10 MG tablet Take 1 tablet (10 mg total) by mouth daily. 90 tablet 1   fexofenadine (ALLERGY RELIEF) 180 MG tablet Take 1 tablet (180 mg total) by mouth daily. 90 tablet 1   fluticasone (FLONASE) 50 MCG/ACT nasal spray SHAKE LIQUID AND USE 2 SPRAYS IN EACH NOSTRIL TWICE DAILY 48 g 1   HYDROcodone-acetaminophen (NORCO) 5-325 MG tablet Take 1 tablet by mouth every 6 (six) hours as needed for moderate pain (pain score 4-6) or severe pain (pain score  7-10). Do not fill before  04/14/23 90 tablet 0   lisinopril (ZESTRIL) 5 MG tablet Take 1 tablet (5 mg total) by mouth daily. 90 tablet 1   montelukast (SINGULAIR) 10 MG tablet TAKE 1 TABLET BY MOUTH EVERY DAY 90 tablet 1   Omega 3-6-9 Fatty Acids (OMEGA 3-6-9 COMPLEX PO) Take one tablet by mouth daily     omeprazole (PRILOSEC) 40 MG capsule TAKE 1 CAPSULE BY MOUTH EVERY DAY 90 capsule 1   OVER THE COUNTER MEDICATION daily as needed. HYLAND'S CELL SALTS #8 MAGNESIA PHOSPHORICA     OVER THE COUNTER MEDICATION Take 10 mg by mouth daily. VITAL PROTEINS COLLAGEN PEPTIDES     sertraline (ZOLOFT) 50 MG tablet Take 1 tablet (50 mg total) by mouth daily. 90 tablet 1   tolterodine (DETROL LA) 4 MG 24 hr capsule TAKE 1 CAPSULE(4 MG) BY MOUTH DAILY AS NEEDED 90 capsule 3   No current facility-administered medications on file prior to visit.    Review of Systems  Constitutional:  Negative for activity change, appetite change, fatigue, fever and unexpected weight change.  HENT:  Negative for congestion, ear pain, rhinorrhea, sinus pressure and sore throat.   Eyes:  Positive for visual disturbance. Negative for pain and redness.  Respiratory:  Negative for cough, shortness of breath and wheezing.   Cardiovascular:  Negative for chest pain and palpitations.  Gastrointestinal:  Negative for abdominal pain, blood in stool, constipation and diarrhea.  Endocrine: Negative for polydipsia and polyuria.  Genitourinary:  Negative for dysuria, frequency and urgency.  Musculoskeletal:  Positive for arthralgias. Negative for back pain and myalgias.  Skin:  Negative for pallor and rash.  Allergic/Immunologic: Negative for environmental allergies.  Neurological:  Negative for dizziness, syncope and headaches.  Hematological:  Negative for adenopathy. Does not bruise/bleed easily.  Psychiatric/Behavioral:  Negative for decreased concentration. The patient is nervous/anxious.        Objective:   Physical  Exam Constitutional:      General: She is not in acute distress.    Appearance: Normal appearance. She is well-developed. She is obese. She is not ill-appearing or diaphoretic.  HENT:     Head: Normocephalic and atraumatic.     Right Ear: Tympanic membrane, ear canal and external ear normal.     Left Ear: Tympanic membrane, ear canal and external ear normal.     Nose: Nose normal. No congestion.     Mouth/Throat:     Mouth: Mucous membranes are moist.     Pharynx: Oropharynx is clear. No posterior oropharyngeal erythema.  Eyes:     General: No scleral icterus.    Extraocular Movements: Extraocular movements intact.     Conjunctiva/sclera: Conjunctivae normal.     Pupils: Pupils are equal, round, and reactive to light.  Neck:  Thyroid: No thyromegaly.     Vascular: No carotid bruit or JVD.  Cardiovascular:     Rate and Rhythm: Normal rate and regular rhythm.     Pulses: Normal pulses.     Heart sounds: Normal heart sounds.     No gallop.  Pulmonary:     Effort: Pulmonary effort is normal. No respiratory distress.     Breath sounds: Normal breath sounds. No wheezing.     Comments: Good air exch Chest:     Chest wall: No tenderness.  Abdominal:     General: Bowel sounds are normal. There is no distension or abdominal bruit.     Palpations: Abdomen is soft. There is no mass.     Tenderness: There is no abdominal tenderness.     Hernia: No hernia is present.  Genitourinary:    Comments: Breast exam: No mass, nodules, thickening, tenderness, bulging, retraction, inflamation, nipple discharge or skin changes noted.  No axillary or clavicular LA.     Musculoskeletal:        General: No tenderness. Normal range of motion.     Cervical back: Normal range of motion and neck supple. No rigidity. No muscular tenderness.     Right lower leg: No edema.     Left lower leg: No edema.     Comments: No kyphosis   Lymphadenopathy:     Cervical: No cervical adenopathy.  Skin:    General:  Skin is warm and dry.     Coloration: Skin is not pale.     Findings: No erythema or rash.     Comments: Multiple brown nevi of different sizes on back  One on mid back has darker center and is irreg shape   Left hand- simple warts /one abraded   Neurological:     Mental Status: She is alert. Mental status is at baseline.     Cranial Nerves: No cranial nerve deficit.     Motor: No abnormal muscle tone.     Coordination: Coordination normal.     Gait: Gait normal.     Deep Tendon Reflexes: Reflexes are normal and symmetric. Reflexes normal.  Psychiatric:        Mood and Affect: Mood normal.        Cognition and Memory: Cognition and memory normal.           Assessment & Plan:   Problem List Items Addressed This Visit       Cardiovascular and Mediastinum   Essential hypertension   bp in fair control at this time  BP Readings from Last 1 Encounters:  03/21/23 128/70   No changes needed Most recent labs reviewed  Disc lifstyle change with low sodium diet and exercise  Plan to continue lisinopril 5 mg daily        Respiratory   RESOLVED: Mild intermittent asthma without complication     Digestive   GERD   Continues omeprazole 40 mg daily  Cannot go without it  Weight loss end  Avoid triggers if able B12 is low-will work on replacement         Musculoskeletal and Integument   Wart of hand   Left hand  Several Pt has scratched them Ref to derm       Relevant Orders   Ambulatory referral to Dermatology   Skin lesion   Mid back  Slaughters with darker spot in middle Irreg shape Ref to derm      Relevant Orders   Ambulatory  referral to Dermatology     Other   Vitamin B12 deficiency   New Lab Results  Component Value Date   VITAMINB12 144 (L) 03/14/2023   On ppi   Shot today  Will start B12 1000 mcg daily Re check at visit in April       Routine general medical examination at a health care facility - Primary   Reviewed health habits including  diet and exercise and skin cancer prevention Reviewed appropriate screening tests for age  Also reviewed health mt list, fam hx and immunization status , as well as social and family history   See HPI Labs reviewed and ordered Health Maintenance  Topic Date Due   Pneumococcal Vaccination (2 of 2 - PCV) 12/01/2019   Mammogram  02/24/2023   Zoster (Shingles) Vaccine (1 of 2) 06/21/2023*   HIV Screening  03/20/2024*   COVID-19 Vaccine (7 - 2024-25 season) 04/05/2024*   Medicare Annual Wellness Visit  12/13/2023   Cologuard (Stool DNA test)  03/29/2024   DTaP/Tdap/Td vaccine (3 - Td or Tdap) 10/29/2024   Pap with HPV screening  05/22/2026   Flu Shot  Completed   Hepatitis C Screening  Completed   HPV Vaccine  Aged Out   Colon Cancer Screening  Discontinued  *Topic was postponed. The date shown is not the original due date.    Pna vaccine -5 year dud in nov  Plans to get shingrix at pharmacy  Mammogram ordered  Cologuard due in a year  Discussed fall prevention, supplements and exercise for bone density  PHQ stable at 8 , with treatment        RETINITIS PIGMENTOSA   Legally blind at this point       Prediabetes   Lab Results  Component Value Date   HGBA1C 5.8 03/14/2023   HGBA1C 5.9 03/10/2022   HGBA1C 5.9 03/10/2021   disc imp of low glycemic diet and wt loss to prevent DM2       Morbid obesity (HCC)   Discussed how this problem influences overall health and the risks it imposes  Reviewed plan for weight loss with lower calorie diet (via better food choices (lower glycemic and portion control) along with exercise building up to or more than 30 minutes 5 days per week including some aerobic activity and strength training   Limited by vision issues and transportation problems in terms of exercise  Ate poorly over holidays Getting back on track  Using some 3 lb arm weights now / encouraged to add core training if able in chair program  Would be a potential candidate for  GLP-1 if ever covered by ins       Hyperlipidemia   Disc goals for lipids and reasons to control them Rev last labs with pt Rev low sat fat diet in detail Improved Will continue zetia 10 mg daily   Intol of statins       Encounter for screening mammogram for breast cancer   Relevant Orders   MM 3D SCREENING MAMMOGRAM BILATERAL BREAST   Dysthymic disorder   Stress levels remain high but overall symptoms are stable Continues sertraline 50 mg daily  Addition of wellbutrin xl 150 mg helped depression symptoms She wants to continue this despite some side effect of restlessness/ jitters For this reason , will not advance dose  Encouraged good self care   Also refilled long term xanax for anxiety - unfortunately this is necessary and has taken for years  Current use of proton pump inhibitor   Colon cancer screening   Cologuard negative 03/2021  This is good for 3 years

## 2023-03-21 NOTE — Assessment & Plan Note (Signed)
 Reviewed health habits including diet and exercise and skin cancer prevention Reviewed appropriate screening tests for age  Also reviewed health mt list, fam hx and immunization status , as well as social and family history   See HPI Labs reviewed and ordered Health Maintenance  Topic Date Due   Pneumococcal Vaccination (2 of 2 - PCV) 12/01/2019   Mammogram  02/24/2023   Zoster (Shingles) Vaccine (1 of 2) 06/21/2023*   HIV Screening  03/20/2024*   COVID-19 Vaccine (7 - 2024-25 season) 04/05/2024*   Medicare Annual Wellness Visit  12/13/2023   Cologuard (Stool DNA test)  03/29/2024   DTaP/Tdap/Td vaccine (3 - Td or Tdap) 10/29/2024   Pap with HPV screening  05/22/2026   Flu Shot  Completed   Hepatitis C Screening  Completed   HPV Vaccine  Aged Out   Colon Cancer Screening  Discontinued  *Topic was postponed. The date shown is not the original due date.    Pna vaccine -5 year dud in nov  Plans to get shingrix at pharmacy  Mammogram ordered  Cologuard due in a year  Discussed fall prevention, supplements and exercise for bone density  PHQ stable at 8 , with treatment

## 2023-03-21 NOTE — Assessment & Plan Note (Signed)
 Disc goals for lipids and reasons to control them Rev last labs with pt Rev low sat fat diet in detail Improved Will continue zetia 10 mg daily   Intol of statins

## 2023-03-21 NOTE — Assessment & Plan Note (Signed)
 Left hand  Several Pt has scratched them Ref to derm

## 2023-03-21 NOTE — Assessment & Plan Note (Signed)
 Stress levels remain high but overall symptoms are stable Continues sertraline 50 mg daily  Addition of wellbutrin xl 150 mg helped depression symptoms She wants to continue this despite some side effect of restlessness/ jitters For this reason , will not advance dose  Encouraged good self care   Also refilled long term xanax for anxiety - unfortunately this is necessary and has taken for years

## 2023-04-05 ENCOUNTER — Ambulatory Visit
Admission: RE | Admit: 2023-04-05 | Discharge: 2023-04-05 | Disposition: A | Discharge status: Home / Self Care | Source: Ambulatory Visit | Attending: Family Medicine | Admitting: Family Medicine

## 2023-04-05 DIAGNOSIS — Z1231 Encounter for screening mammogram for malignant neoplasm of breast: Secondary | ICD-10-CM | POA: Diagnosis not present

## 2023-04-07 ENCOUNTER — Encounter: Payer: Self-pay | Admitting: Family Medicine

## 2023-04-11 ENCOUNTER — Other Ambulatory Visit: Payer: PPO

## 2023-04-13 ENCOUNTER — Other Ambulatory Visit: Payer: Self-pay | Admitting: Family Medicine

## 2023-04-17 ENCOUNTER — Encounter: Payer: Self-pay | Admitting: Family Medicine

## 2023-05-13 ENCOUNTER — Ambulatory Visit (INDEPENDENT_AMBULATORY_CARE_PROVIDER_SITE_OTHER): Admitting: Family Medicine

## 2023-05-13 ENCOUNTER — Encounter: Payer: Self-pay | Admitting: Family Medicine

## 2023-05-13 VITALS — BP 136/78 | HR 80 | Temp 98.3°F | Ht 61.0 in | Wt 297.0 lb

## 2023-05-13 DIAGNOSIS — Z0184 Encounter for antibody response examination: Secondary | ICD-10-CM | POA: Insufficient documentation

## 2023-05-13 DIAGNOSIS — E538 Deficiency of other specified B group vitamins: Secondary | ICD-10-CM | POA: Diagnosis not present

## 2023-05-13 DIAGNOSIS — G8929 Other chronic pain: Secondary | ICD-10-CM | POA: Diagnosis not present

## 2023-05-13 LAB — VITAMIN B12: Vitamin B-12: 544 pg/mL (ref 211–911)

## 2023-05-13 MED ORDER — HYDROCODONE-ACETAMINOPHEN 5-325 MG PO TABS: 1.0000 | ORAL_TABLET | Freq: Four times a day (QID) | ORAL | 0 refills | Status: DC | PRN

## 2023-05-13 MED ORDER — FLUTICASONE PROPIONATE 50 MCG/ACT NA SUSP: NASAL | 3 refills | Status: DC

## 2023-05-13 NOTE — Assessment & Plan Note (Signed)
 Doing well with norco 5-325 mg up to every 6 h prn for knee and shoulder arthritis pain #90 per month  No side effects UDS and contract review today  [facility] reviewed with no ref flags today (also takes xanax ) Refills done to fill today, on or after 05/15/23, and on or after 06/14/23, and 07/15/23   for the norco  Continues orthopedic care also- had injections in knee and shoulder planned soon withDr Glendale Landmark (next week)  Enc continued work on [employer] loss/ still struggled with this

## 2023-05-13 NOTE — Assessment & Plan Note (Signed)
 B12 level today  Taking 1000 mcg daily  Energy level is stable No new symptoms

## 2023-05-13 NOTE — Progress Notes (Signed)
 Subjective:    Patient ID: Monica Madden, female    DOB: 08-Oct-1960, 63 y.o.   MRN: [id number]  HPI  Wt Readings from Last 3 Encounters:  05/13/23 297 lb (134.7 kg)  03/21/23 288 lb 6 oz (130.8 kg)  02/14/23 291 lb (132 kg)   56.12 kg/m  Vitals:   05/13/23 1051  BP: 136/78  Pulse: 80  Temp: 98.3 F (36.8 C)  SpO2: 98%   Pt presents for encounter for chronic pain treatment  B12 deficiency-due for check  Questions about MMR immunity  Needs refill of flonase    Indication for chronic opioid: arthritis pain in knees and shoulders  Medication and dose: nocro 5-325 mg every 6 hours prn   # pills per month: 90  Last UDS date: 05/12/22  low risk -is due for   Opioid Treatment Agreement signed (Y/N): yes  Opioid Treatment Agreement last reviewed with patient:  05/12/22, will do today   NCCSRS reviewed this encounter (include red flags): Yes  today, no red flags  Last refill 04/16/23    Also takes alprazolam  for anxiety   The medication helps her to function  Avoids other pain medicines Cannot walk for long distances    Sees Dr Glendale Landmark for ortho care  Gets her next shots on Thursday  Left knee (entire leg) hurts today - since due for shots (left knee and right shoulder)   Low back hurts also   Thinks she was immunized for MMR most likely  Interested in knowing her immune status    Patient Active Problem List   Diagnosis Date Noted   Immunity status testing 05/13/2023   Encounter for screening mammogram for breast cancer 03/21/2023   Vitamin B12 deficiency 03/21/2023   Skin lesion 03/21/2023   Wart of hand 03/21/2023   Current use of proton pump inhibitor 02/27/2023   Colon cancer screening 03/17/2021   Paresthesia of both hands 08/06/2020   Medicare annual wellness visit, subsequent 12/01/2018   Encounter for chronic pain management 02/07/2017   Chronic pain 11/17/2015   Prediabetes 11/09/2015   Osteoarthritis of knee 07/18/2013   Routine general  medical examination at a health care facility 06/30/2011   Amenorrhea 06/30/2011   IRRITABLE BOWEL SYNDROME 08/07/2009   KNEE PAIN, BILATERAL 08/07/2009   Pain in joint involving lower leg 08/07/2009   LEG PAIN, LEFT 10/09/2008   Hyperlipidemia 05/09/2008   Essential hypertension 03/29/2008   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 06/05/2007   Incisional hernia 05/12/2007   FREQUENCY, URINARY 05/12/2007   DEPENDENT EDEMA, LEGS 11/03/2006   Morbid obesity (HCC) 07/15/2006   Dysthymic disorder 07/15/2006   Migraine headache 07/15/2006   RETINITIS PIGMENTOSA 07/15/2006   VARICOSE VEINS, LOWER EXTREMITIES 07/15/2006   Allergic rhinitis 07/15/2006   GERD 07/15/2006   BACK PAIN, LUMBAR, CHRONIC 07/15/2006   INCONTINENCE, URGE 07/15/2006   Asthma 07/15/2006   Past Medical History:  Diagnosis Date   Allergy    allergic rhinitis   Anemia    Anxiety    ATTACKS   Arthritis    Asthma    Cataract    left cataract removal 12/2008   Chronic back pain    OA of spine   Depression    GERD (gastroesophageal reflux disease)    Endo negative 02/2000   Hypertension 03/2008   IBS (irritable bowel syndrome)    MRSA infection 2008   Right leg   Obesity    Ovarian cyst    Retinitis pigmentosa  blind   Past Surgical History:  Procedure Laterality Date   APPENDECTOMY     BLADDER SURGERY  1968   age 60 ? dilitation   CATARACT EXTRACTION W/PHACO Right 11/11/2014   Procedure: CATARACT EXTRACTION PHACO AND INTRAOCULAR LENS PLACEMENT (IOC);  Surgeon: Steven Dingeldein, MD;  Location: [facility] ORS;  Service: Ophthalmology;  Laterality: Right;  LOT PACK: [id number] H US : 00:29.7 AP: 11.2 CDE: 7.14   CESAREAN SECTION     CHOLECYSTECTOMY     CYSTOSCOPY  03/2008   negative// Dr. Theotis Flake urologist   ENDOMETRIAL BIOPSY  07/1998   negative   EYE SURGERY  12/2008   cataract removal left   HERNIA REPAIR  05/2006   ruptured hernia repair after fall and then two more surgeries within as  many weeks for complications(05/2006) Umbilical hernia repair (06/1997)   KNEE ARTHROSCOPY W/ MENISCAL REPAIR     left knne   Social History   Tobacco Use   Smoking status: Never   Smokeless tobacco: Never  Vaping Use   Vaping status: Never Used  Substance Use Topics   Alcohol use: Yes    Comment: twice a year   Drug use: Yes    Types: Marijuana   Family History  Problem Relation Age of Onset   Depression Mother    Heart disease Father    Diabetes Maternal Uncle    Hypertension Maternal Grandmother    Heart disease Paternal Grandmother    Heart disease Paternal Grandfather    Depression Brother        Depression secondary to MVA injuries   Hypertension Brother    Breast cancer Neg Hx    Allergies  Allergen Reactions   Diclofenac Sodium     REACTION: allergic/ made asthma bad   Etodolac     REACTION: GI   Metronidazole     REACTION: GI side eff   Nsaids Other (See Comments)    GI Upset   Oxybutynin  Itching   Rosuvastatin      Myalgias    Sulfamethoxazole-Trimethoprim    Tramadol Other (See Comments)    Caused elevated BP   Sulfa Antibiotics Itching and Rash   Current Outpatient Medications on File Prior to Visit  Medication Sig Dispense Refill   albuterol  (PROAIR  HFA) 108 (90 Base) MCG/ACT inhaler Inhale 2 puffs into the lungs every 6 (six) hours as needed for wheezing or shortness of breath. 1 each 5   ALPRAZolam  (XANAX ) 1 MG tablet TAKE 1 TABLET(1 MG) BY MOUTH THREE TIMES DAILY AS NEEDED 90 tablet 3   Ascorbic Acid (VITAMIN C) 1000 MG tablet Take 1,000 mg by mouth daily.  Vitamin C with Rosehips     Bee Pollen 500 MG CHEW Chew 500 mg by mouth in the morning and at bedtime.     BLACK ELDERBERRY PO Take 1,150 mg by mouth daily.     buPROPion  (WELLBUTRIN  XL) 300 MG 24 hr tablet Take 1 tablet (300 mg total) by mouth daily. 90 tablet 3   Calcium  Carbonate-Vitamin D 600-400 MG-UNIT tablet Take 1 tablet by mouth daily.     Coenzyme Q10 (CO Q 10) 100 MG CAPS Take 1  Capful by mouth daily.     Collagen-Vitamin C-Biotin (COLLAGEN PO) Take 1 Scoop by mouth daily.     Cranberry-Vitamin C-Probiotic (AZO CRANBERRY PO) Take 500 mg by mouth every other day.     cyanocobalamin  (VITAMIN B12) 1000 MCG tablet Take 1,000 mcg by mouth daily.     cyclobenzaprine  (FLEXERIL ) 10 MG  tablet Take 1 tablet (10 mg total) by mouth 3 (three) times daily as needed for muscle spasms. Caution of sedation 30 tablet 3   ezetimibe  (ZETIA ) 10 MG tablet Take 1 tablet (10 mg total) by mouth daily. 90 tablet 1   fexofenadine  (ALLERGY RELIEF) 180 MG tablet Take 1 tablet (180 mg total) by mouth daily. 90 tablet 1   lisinopril  (ZESTRIL ) 5 MG tablet Take 1 tablet (5 mg total) by mouth daily. 90 tablet 1   montelukast  (SINGULAIR ) 10 MG tablet TAKE 1 TABLET BY MOUTH EVERY DAY 90 tablet 1   Omega 3-6-9 Fatty Acids (OMEGA 3-6-9 COMPLEX PO) Take one tablet by mouth daily     omeprazole  (PRILOSEC) 40 MG capsule TAKE 1 CAPSULE BY MOUTH EVERY DAY 90 capsule 1   OVER THE COUNTER MEDICATION daily as needed. HYLAND'S CELL SALTS #8 MAGNESIA PHOSPHORICA     OVER THE COUNTER MEDICATION Take 10 mg by mouth daily. VITAL PROTEINS COLLAGEN PEPTIDES     sertraline  (ZOLOFT ) 50 MG tablet Take 1 tablet (50 mg total) by mouth daily. 90 tablet 1   tolterodine  (DETROL  LA) 4 MG 24 hr capsule TAKE 1 CAPSULE(4 MG) BY MOUTH DAILY AS NEEDED 90 capsule 3   No current facility-administered medications on file prior to visit.    Review of Systems  Constitutional:  Negative for activity change, appetite change, fatigue, fever and unexpected weight change.  HENT:  Negative for congestion, ear pain, rhinorrhea, sinus pressure and sore throat.   Eyes:  Positive for visual disturbance. Negative for pain and redness.  Respiratory:  Negative for cough, shortness of breath and wheezing.   Cardiovascular:  Negative for chest pain and palpitations.  Gastrointestinal:  Negative for abdominal pain, blood in stool, constipation and  diarrhea.  Endocrine: Negative for polydipsia and polyuria.  Genitourinary:  Negative for dysuria, frequency and urgency.  Musculoskeletal:  Positive for arthralgias, back pain and gait problem. Negative for myalgias.  Skin:  Negative for pallor and rash.  Allergic/Immunologic: Negative for environmental allergies.  Neurological:  Negative for dizziness, syncope and headaches.  Hematological:  Negative for adenopathy. Does not bruise/bleed easily.  Psychiatric/Behavioral:  Negative for decreased concentration and dysphoric mood. The patient is nervous/anxious.        Objective:   Physical Exam Constitutional:      General: She is not in acute distress.    Appearance: Normal appearance. She is well-developed. She is obese. She is not ill-appearing or diaphoretic.  HENT:     Head: Normocephalic and atraumatic.  Eyes:     Conjunctiva/sclera: Conjunctivae normal.     Pupils: Pupils are equal, round, and reactive to light.  Neck:     Thyroid : No thyromegaly.     Vascular: No carotid bruit or JVD.  Cardiovascular:     Rate and Rhythm: Normal rate and regular rhythm.     Heart sounds: Normal heart sounds.     No gallop.  Pulmonary:     Effort: Pulmonary effort is normal. No respiratory distress.     Breath sounds: Normal breath sounds. No wheezing or rales.  Abdominal:     General: There is no distension or abdominal bruit.     Palpations: Abdomen is soft.  Musculoskeletal:     Cervical back: Normal range of motion and neck supple.     Right lower leg: No edema.     Left lower leg: No edema.     Comments: Left knee has compression sleeve- limited rom with pain  Walks with cane and assist  Limited rom shoulders   No joint changes in hands   Lymphadenopathy:     Cervical: No cervical adenopathy.  Skin:    General: Skin is warm and dry.     Coloration: Skin is not pale.     Findings: No rash.  Neurological:     Mental Status: She is alert.     Coordination: Coordination  normal.     Deep Tendon Reflexes: Reflexes are normal and symmetric. Reflexes normal.  Psychiatric:        Mood and Affect: Mood normal.     Comments: Pleasant Candidly discusses symptoms and stressors             Assessment & Plan:   Problem List Items Addressed This Visit       Other   Vitamin B12 deficiency   B12 level today  Taking 1000 mcg daily  Energy level is stable No new symptoms        Relevant Orders   Vitamin B12   Immunity status testing   Suspect but not totally sure pt was immunized for MMR and would like testing  Ordered today      Relevant Orders   Measles/Mumps/Rubella Immunity   Encounter for chronic pain management - Primary   Doing well with norco 5-325 mg up to every 6 h prn for knee and shoulder arthritis pain #90 per month  No side effects UDS and contract review today  NCCSRS reviewed with no ref flags today (also takes xanax ) Refills done to fill today, on or after 05/15/23, and on or after 06/14/23, and 07/15/23   for the norco  Continues orthopedic care also- had injections in knee and shoulder planned soon withDr Glendale Landmark (next week)  Enc continued work on [employer] loss/ still struggled with this       Relevant Orders   DRUG MONITORING, PANEL 8 WITH CONFIRMATION, URINE   Chronic pain   Encouraged for chronic pain management today  Norco  Knees and shoulders and low back  Causes mobility impairment (along with blindness)   Handout for handicapped parking up to date       Relevant Medications   HYDROcodone -acetaminophen  (NORCO) 5-325 MG tablet   Other Relevant Orders   DRUG MONITORING, PANEL 8 WITH CONFIRMATION, URINE

## 2023-05-13 NOTE — Patient Instructions (Addendum)
 Vitamin B12 level today   MMR immunity labs today   Urine drug screen today   Take care of yourself   Follow up for next chronic pain encounter in 3 months

## 2023-05-13 NOTE — Assessment & Plan Note (Signed)
 Encouraged for chronic pain management today  Norco  Knees and shoulders and low back  Causes mobility impairment (along with blindness)   Handout for handicapped parking up to date

## 2023-05-13 NOTE — Assessment & Plan Note (Signed)
 Suspect but not totally sure pt was immunized for MMR and would like testing  Ordered today

## 2023-05-15 ENCOUNTER — Encounter: Payer: Self-pay | Admitting: Family Medicine

## 2023-05-17 DIAGNOSIS — M1712 Unilateral primary osteoarthritis, left knee: Secondary | ICD-10-CM | POA: Diagnosis not present

## 2023-05-17 DIAGNOSIS — M25511 Pain in right shoulder: Secondary | ICD-10-CM | POA: Diagnosis not present

## 2023-05-17 LAB — DRUG MONITORING, PANEL 8 WITH CONFIRMATION, URINE
6 Acetylmorphine: NEGATIVE ng/mL (ref <10)
Alcohol Metabolites: NEGATIVE ng/mL (ref <500)
Alphahydroxyalprazolam: 692 ng/mL — ABNORMAL HIGH (ref <25)
Alphahydroxymidazolam: NEGATIVE ng/mL (ref <50)
Alphahydroxytriazolam: NEGATIVE ng/mL (ref <50)
Aminoclonazepam: NEGATIVE ng/mL (ref <25)
Amphetamines: NEGATIVE ng/mL (ref <500)
Benzodiazepines: POSITIVE ng/mL — AB (ref <100)
Buprenorphine, Urine: NEGATIVE ng/mL (ref <5)
Cocaine Metabolite: NEGATIVE ng/mL (ref <150)
Codeine: NEGATIVE ng/mL (ref <50)
Creatinine: 143.1 mg/dL (ref >=20.0)
Hydrocodone: 2915 ng/mL — ABNORMAL HIGH (ref <50)
Hydromorphone: NEGATIVE ng/mL (ref <50)
Hydroxyethylflurazepam: NEGATIVE ng/mL (ref <50)
Lorazepam: NEGATIVE ng/mL (ref <50)
MDMA: NEGATIVE ng/mL (ref <500)
Marijuana Metabolite: NEGATIVE ng/mL (ref <20)
Morphine: NEGATIVE ng/mL (ref <50)
Nordiazepam: NEGATIVE ng/mL (ref <50)
Norhydrocodone: 2529 ng/mL — ABNORMAL HIGH (ref <50)
Opiates: POSITIVE ng/mL — AB (ref <100)
Oxazepam: NEGATIVE ng/mL (ref <50)
Oxidant: NEGATIVE ug/mL (ref <200)
Oxycodone: NEGATIVE ng/mL (ref <100)
Temazepam: NEGATIVE ng/mL (ref <50)
pH: 6 (ref 4.5–9.0)

## 2023-05-17 LAB — DM TEMPLATE

## 2023-05-17 LAB — MEASLES/MUMPS/RUBELLA IMMUNITY
Mumps IgG: 28.2 [AU]/ml
Rubella: 22.3 {index}
Rubeola IgG: >300 [AU]/ml

## 2023-06-08 ENCOUNTER — Other Ambulatory Visit: Payer: Self-pay | Admitting: Family Medicine

## 2023-06-22 DIAGNOSIS — H3552 Pigmentary retinal dystrophy: Secondary | ICD-10-CM | POA: Diagnosis not present

## 2023-06-22 DIAGNOSIS — Z961 Presence of intraocular lens: Secondary | ICD-10-CM | POA: Diagnosis not present

## 2023-06-25 ENCOUNTER — Other Ambulatory Visit: Payer: Self-pay | Admitting: Family Medicine

## 2023-07-16 ENCOUNTER — Other Ambulatory Visit: Payer: Self-pay | Admitting: Family Medicine

## 2023-07-17 ENCOUNTER — Encounter: Payer: Self-pay | Admitting: Family Medicine

## 2023-07-18 MED ORDER — HYDROCODONE-ACETAMINOPHEN 5-325 MG PO TABS: 1.0000 | ORAL_TABLET | Freq: Four times a day (QID) | ORAL | 0 refills | Status: DC | PRN

## 2023-07-18 NOTE — Telephone Encounter (Signed)
 Name of Medication: Norco Name of Pharmacy: [facility] [address] Last Fill or Written Date and Quantity: 05/13/23 # 90 tab/ 0 refill Last Office Visit and Type: pain med f/u 05/13/23 Next Office Visit and Type: pain med f/u 08/16/23 Last Controlled Substance Agreement Date: 05/13/23 Last UDS:05/13/23

## 2023-07-18 NOTE — Telephone Encounter (Signed)
 Name of Medication: Xanax  Name of Pharmacy: [facility] [address] [facility] Last Fill or Written Date and Quantity: 03/16/23 #90 tabs/ 3 refills  Last Office Visit and Type: pain med f/u 05/13/23 Next Office Visit and Type: pain med f/u 08/16/23 Last Controlled Substance Agreement Date: 05/13/23 Last UDS:05/13/23

## 2023-07-18 NOTE — Telephone Encounter (Signed)
 Copied from CRM [id number]. Topic: Clinical - Medication Question >> Jul 18, 2023  9:31 AM Avram MATSU wrote: Reason for CRM: patient needs medication but there is a new controlled drug policy and the provider would need to speak with the pharmacist.

## 2023-08-16 ENCOUNTER — Encounter: Payer: Self-pay | Admitting: Family Medicine

## 2023-08-16 ENCOUNTER — Ambulatory Visit (INDEPENDENT_AMBULATORY_CARE_PROVIDER_SITE_OTHER): Admitting: Family Medicine

## 2023-08-16 VITALS — BP 118/66 | HR 77 | Temp 98.2°F | Ht 61.0 in | Wt 291.0 lb

## 2023-08-16 DIAGNOSIS — G8929 Other chronic pain: Secondary | ICD-10-CM

## 2023-08-16 DIAGNOSIS — M170 Bilateral primary osteoarthritis of knee: Secondary | ICD-10-CM | POA: Diagnosis not present

## 2023-08-16 MED ORDER — HYDROCODONE-ACETAMINOPHEN 5-325 MG PO TABS: 1.0000 | ORAL_TABLET | Freq: Four times a day (QID) | ORAL | 0 refills | Status: DC | PRN
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
## 2023-08-16 NOTE — Assessment & Plan Note (Signed)
 Doing well with norco 5-325 mg up to every 6 h prn for knee and shoulder arthritis pain #90 per month  No side effects UDS and contract review today - frp, 05/13/23  NCCSRS reviewed with no ref flags today (also takes xanax ) Refills done to fill today for norco (will call for subsequent refills) Aware she also takes alprazolam  for anxiety  Continues orthopedic care also- had injections in knee and shoulder planned soon withDr Charlene (next week)  Enc continued work on [employer] loss/ still struggled with this

## 2023-08-16 NOTE — Patient Instructions (Signed)
 Let us  know if you need anything   Take care of yourself   If you need a social work / community care referral in the future let me know    I sent in the norco prescription    Follow up in 3 months for encounter for pain management

## 2023-08-16 NOTE — Assessment & Plan Note (Signed)
 Continues orthopedic follow up for knees and shoulders both Getting injections   Not candidate for knee replacement unless bmi decreases

## 2023-08-17 NOTE — Telephone Encounter (Signed)
 I am aware Please let pharmacy know  She has been on both meds for a long time and no problems Thanks

## 2023-08-18 NOTE — Telephone Encounter (Signed)
 Copied from CRM [id number]. Topic: Clinical - Prescription Issue >> Aug 18, 2023  1:04 PM [place] J wrote: Reason for CRM: Patient would like to Dr. Randeen to know that [facility] is still stating that they are needing more information in regards to her HYDROcodone -acetaminophen  (NORCO) 5-325 MG tablet. She also stated that the pharmacy has concerns in regards to her ALPRAZolam  (XANAX ) 1 MG tablet. ----------------------------------------------------------------------- From previous Reason for Contact - Other: Reason for CRM:

## 2023-08-23 DIAGNOSIS — M1712 Unilateral primary osteoarthritis, left knee: Secondary | ICD-10-CM | POA: Diagnosis not present

## 2023-08-23 DIAGNOSIS — M7581 Other shoulder lesions, right shoulder: Secondary | ICD-10-CM | POA: Diagnosis not present

## 2023-09-15 ENCOUNTER — Encounter: Payer: Self-pay | Admitting: Family Medicine

## 2023-09-16 MED ORDER — HYDROCODONE-ACETAMINOPHEN 5-325 MG PO TABS: 1.0000 | ORAL_TABLET | Freq: Four times a day (QID) | ORAL | 0 refills | Status: DC | PRN

## 2023-10-15 ENCOUNTER — Encounter: Payer: Self-pay | Admitting: Family Medicine

## 2023-10-18 MED ORDER — TOLTERODINE TARTRATE ER 4 MG PO CP24: ORAL_CAPSULE | ORAL | 0 refills | Status: AC

## 2023-11-13 ENCOUNTER — Other Ambulatory Visit: Payer: Self-pay | Admitting: Family Medicine

## 2023-11-15 ENCOUNTER — Ambulatory Visit (INDEPENDENT_AMBULATORY_CARE_PROVIDER_SITE_OTHER): Admitting: Family Medicine

## 2023-11-15 ENCOUNTER — Encounter: Payer: Self-pay | Admitting: Family Medicine

## 2023-11-15 VITALS — BP 128/76 | HR 76 | Temp 98.2°F | Ht 61.0 in | Wt 295.5 lb

## 2023-11-15 DIAGNOSIS — R7303 Prediabetes: Secondary | ICD-10-CM

## 2023-11-15 DIAGNOSIS — G8929 Other chronic pain: Secondary | ICD-10-CM

## 2023-11-15 MED ORDER — METFORMIN HCL ER 500 MG PO TB24: 500.0000 mg | ORAL_TABLET | Freq: Every day | ORAL | 0 refills | Status: AC

## 2023-11-15 MED ORDER — HYDROCODONE-ACETAMINOPHEN 5-325 MG PO TABS: 1.0000 | ORAL_TABLET | Freq: Four times a day (QID) | ORAL | 0 refills | Status: DC | PRN

## 2023-11-15 MED ORDER — ALPRAZOLAM 1 MG PO TABS: ORAL_TABLET | ORAL | 3 refills | Status: AC

## 2023-11-15 NOTE — Assessment & Plan Note (Signed)
 Discussed how this problem influences overall health and the risks it imposes  Reviewed plan for weight loss with lower calorie diet (via better food choices (lower glycemic and portion control) along with exercise building up to or more than 30 minutes 5 days per week including some aerobic activity and strength training   Would like to refer to healthy weight and wellness but pt cannot get there every 2 weeks  Trial of metformin xr 500 mg daily (reviewed possible side effects)  If tolerated can increase dose   GLP-1 may be option later if insurance covered it

## 2023-11-15 NOTE — Assessment & Plan Note (Signed)
 Lab Results  Component Value Date   HGBA1C 5.8 03/14/2023   HGBA1C 5.9 03/10/2022   HGBA1C 5.9 03/10/2021   Interested in trial of metformin for weight and glucose control  Metformin xr 500 mg once daily with bkfast  Discussed possible gi side effects-will hold and update if not tolerated Follow up 3 mo

## 2023-11-15 NOTE — Assessment & Plan Note (Signed)
 Encouraged for chronic pain management today  Norco  Knees and shoulders and low back  Causes mobility impairment (along with blindness)   Handout for handicapped parking up to date

## 2023-11-15 NOTE — Patient Instructions (Addendum)
 Try metformin xr 500 mg once daily in the morning  It lowers glucose and would help with prediabetes , and may help with weight loss  If any side effects (usually GI ) let us  know   Continue current medicines   Avoid added sugars in your diet when you can  Try to get most of your carbohydrates from produce (with the exception of white potatoes) and whole grains Eat less bread/pasta/rice/snack foods/cereals/sweets and other items from the middle of the grocery store (processed carbs) Try and keep sweets out of the house   Stay as active as you can safely be  3 lb weights are great   Follow up in 3 months for chronic pain encounter

## 2023-11-15 NOTE — Assessment & Plan Note (Addendum)
 Doing well with norco 5-325 mg up to every 6 h prn for knee and shoulder arthritis pain #90 per month  No side effects UDS and contract review today -  05/13/23  [facility] reviewed with no ref flags today (also takes xanax ) Refills done to fill today for norco (will call for subsequent refills) Aware she also takes alprazolam  for anxiety (sent with refills today) Continues orthopedic care also- had injections in knee and shoulder planned soon withDr Charlene (next week)  Enc continued work on [employer] loss/ still struggled with this trying metformin)   Meds ordered this encounter  Medications   metFORMIN (GLUCOPHAGE-XR) 500 MG 24 hr tablet    Sig: Take 1 tablet (500 mg total) by mouth daily with breakfast.    Dispense:  90 tablet    Refill:  0   HYDROcodone -acetaminophen  (NORCO) 5-325 MG tablet    Sig: Take 1 tablet by mouth every 6 (six) hours as needed for moderate pain (pain score 4-6) or severe pain (pain score 7-10).    Dispense:  90 tablet    Refill:  0   ALPRAZolam  (XANAX ) 1 MG tablet    Sig: TAKE 1 TABLET(1 MG) BY MOUTH THREE TIMES DAILY AS NEEDED    Dispense:  90 tablet    Refill:  3    This request is for a new prescription for a controlled substance as required by Federal/State law.

## 2023-11-15 NOTE — Progress Notes (Signed)
 Subjective:    Patient ID: Monica Madden, female    DOB: Sep 20, 1960, 63 y.o.   MRN: [id number]  HPI  Wt Readings from Last 3 Encounters:  11/15/23 295 lb 8 oz (134 kg)  08/16/23 291 lb (132 kg)  05/13/23 297 lb (134.7 kg)   55.83 kg/m  Vitals:   11/15/23 0814  BP: 128/76  Pulse: 76  Temp: 98.2 F (36.8 C)  SpO2: 97%    Indication for chronic opioid: arthritis in knees and shoulders and ankles (mod to severe)   Medication and dose: norco 5-325 mg every 6 hours   # pills per month: 90  Last UDS date:  05/13/23 low risk  Opioid Treatment Agreement signed (Y/N): yes  Opioid Treatment Agreement last reviewed with patient:  05/13/23  [facility] reviewed this encounter (include red flags):  today Last refil of norco was 10/17/23 Alprazolam  (for anxiety) 90 pills on 10/17/23   Orthopedic follow up  Has shots planned Friday -knee/shoulder  Needs knee surgery but has to get weight down   Frustrated with her weight  Too many sweets in past 6 days  Up and down  Before that eating a whole lot better /  Moving as much as she can tolerate    Lab Results  Component Value Date   NA 138 03/14/2023   K 4.4 03/14/2023   CO2 30 03/14/2023   GLUCOSE 94 03/14/2023   BUN 24 (H) 03/14/2023   CREATININE 0.88 03/14/2023   CALCIUM  9.6 03/14/2023   GFR 71.31 03/14/2023   GFRNONAA 100.19 09/26/2009      Patient Active Problem List   Diagnosis Date Noted   Immunity status testing 05/13/2023   Encounter for screening mammogram for breast cancer 03/21/2023   Vitamin B12 deficiency 03/21/2023   Skin lesion 03/21/2023   Wart of hand 03/21/2023   Current use of proton pump inhibitor 02/27/2023   Colon cancer screening 03/17/2021   Paresthesia of both hands 08/06/2020   Medicare annual wellness visit, subsequent 12/01/2018   Encounter for chronic pain management 02/07/2017   Chronic pain 11/17/2015   Prediabetes 11/09/2015   Osteoarthritis of knee 07/18/2013   Routine general  medical examination at a health care facility 06/30/2011   Amenorrhea 06/30/2011   IRRITABLE BOWEL SYNDROME 08/07/2009   KNEE PAIN, BILATERAL 08/07/2009   Pain in joint involving lower leg 08/07/2009   LEG PAIN, LEFT 10/09/2008   Hyperlipidemia 05/09/2008   Essential hypertension 03/29/2008   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 06/05/2007   Incisional hernia 05/12/2007   FREQUENCY, URINARY 05/12/2007   DEPENDENT EDEMA, LEGS 11/03/2006   Morbid obesity (HCC) 07/15/2006   Dysthymic disorder 07/15/2006   Migraine headache 07/15/2006   RETINITIS PIGMENTOSA 07/15/2006   VARICOSE VEINS, LOWER EXTREMITIES 07/15/2006   Allergic rhinitis 07/15/2006   GERD 07/15/2006   BACK PAIN, LUMBAR, CHRONIC 07/15/2006   INCONTINENCE, URGE 07/15/2006   Asthma 07/15/2006   Past Medical History:  Diagnosis Date   Allergy    allergic rhinitis   Anemia    Anxiety    ATTACKS   Arthritis    Asthma    Cataract    left cataract removal 12/2008   Chronic back pain    OA of spine   Depression    GERD (gastroesophageal reflux disease)    Endo negative 02/2000   Hypertension 03/2008   IBS (irritable bowel syndrome)    MRSA infection 2008   Right leg   Obesity  Ovarian cyst    Retinitis pigmentosa    blind   Past Surgical History:  Procedure Laterality Date   APPENDECTOMY     BLADDER SURGERY  1968   age 21 ? dilitation   CATARACT EXTRACTION W/PHACO Right 11/11/2014   Procedure: CATARACT EXTRACTION PHACO AND INTRAOCULAR LENS PLACEMENT (IOC);  Surgeon: Steven Dingeldein, MD;  Location: [facility];  Service: Ophthalmology;  Laterality: Right;  LOT PACK: [id number] H US : 00:29.7 AP: 11.2 CDE: 7.14   CESAREAN SECTION     CHOLECYSTECTOMY     CYSTOSCOPY  03/2008   negative// Dr. Amiel urologist   ENDOMETRIAL BIOPSY  07/1998   negative   EYE SURGERY  12/2008   cataract removal left   HERNIA REPAIR  05/2006   ruptured hernia repair after fall and then two more surgeries within as  many weeks for complications(05/2006) Umbilical hernia repair (06/1997)   KNEE ARTHROSCOPY W/ MENISCAL REPAIR     left knne   Social History   Tobacco Use   Smoking status: Never   Smokeless tobacco: Never  Vaping Use   Vaping status: Never Used  Substance Use Topics   Alcohol use: Yes    Comment: twice a year   Drug use: Yes    Types: Marijuana   Family History  Problem Relation Age of Onset   Depression Mother    Heart disease Father    Diabetes Maternal Uncle    Hypertension Maternal Grandmother    Heart disease Paternal Grandmother    Heart disease Paternal Grandfather    Depression Brother        Depression secondary to MVA injuries   Hypertension Brother    Breast cancer Neg Hx    Allergies  Allergen Reactions   Diclofenac Sodium     REACTION: allergic/ made asthma bad   Etodolac     REACTION: GI   Metronidazole     REACTION: GI side eff   Nsaids Other (See Comments)    GI Upset   Oxybutynin  Itching   Rosuvastatin      Myalgias    Sulfamethoxazole-Trimethoprim    Tramadol Other (See Comments)    Caused elevated BP   Sulfa Antibiotics Itching and Rash   Current Outpatient Medications on File Prior to Visit  Medication Sig Dispense Refill   albuterol  (PROAIR  HFA) 108 (90 Base) MCG/ACT inhaler Inhale 2 puffs into the lungs every 6 (six) hours as needed for wheezing or shortness of breath. 1 each 5   Ascorbic Acid (VITAMIN C) 1000 MG tablet Take 1,000 mg by mouth daily.  Vitamin C with Rosehips     Bee Pollen 500 MG CHEW Chew 500 mg by mouth in the morning and at bedtime.     BLACK ELDERBERRY PO Take 1,150 mg by mouth daily.     buPROPion  (WELLBUTRIN  XL) 300 MG 24 hr tablet Take 1 tablet (300 mg total) by mouth daily. 90 tablet 3   Calcium  Carbonate-Vitamin D 600-400 MG-UNIT tablet Take 1 tablet by mouth daily.     Coenzyme Q10 (CO Q 10) 100 MG CAPS Take 1 Capful by mouth daily.     Cranberry-Vitamin C-Probiotic (AZO CRANBERRY PO) Take 500 mg by mouth every  other day.     cyanocobalamin  (VITAMIN B12) 1000 MCG tablet Take 1,000 mcg by mouth daily.     cyclobenzaprine  (FLEXERIL ) 10 MG tablet Take 1 tablet (10 mg total) by mouth 3 (three) times daily as needed for muscle spasms. Caution of sedation 30 tablet 3  ezetimibe  (ZETIA ) 10 MG tablet TAKE 1 TABLET BY MOUTH EVERY DAY 90 tablet 1   fexofenadine  (ALLERGY RELIEF) 180 MG tablet Take 1 tablet (180 mg total) by mouth daily. 90 tablet 1   lisinopril  (ZESTRIL ) 5 MG tablet TAKE 1 TABLET (5 MG TOTAL) BY MOUTH DAILY. 90 tablet 1   montelukast  (SINGULAIR ) 10 MG tablet TAKE 1 TABLET BY MOUTH EVERY DAY 90 tablet 1   Omega 3-6-9 Fatty Acids (OMEGA 3-6-9 COMPLEX PO) Take one tablet by mouth daily     omeprazole  (PRILOSEC) 40 MG capsule TAKE 1 CAPSULE BY MOUTH EVERY DAY 90 capsule 1   OVER THE COUNTER MEDICATION daily as needed. HYLAND'S CELL SALTS #8 MAGNESIA PHOSPHORICA     OVER THE COUNTER MEDICATION Take 10 mg by mouth daily. VITAL PROTEINS COLLAGEN PEPTIDES     sertraline  (ZOLOFT ) 50 MG tablet TAKE 1 TABLET BY MOUTH EVERY DAY 90 tablet 1   tolterodine  (DETROL  LA) 4 MG 24 hr capsule TAKE 1 CAPSULE(4 MG) BY MOUTH DAILY AS NEEDED 90 capsule 0   fluticasone  (FLONASE ) 50 MCG/ACT nasal spray SHAKE LIQUID AND SPRAY 2 SPRAYS INTO EACH NOSTRIL TWICE A DAY 48 mL 1   No current facility-administered medications on file prior to visit.    Review of Systems  Constitutional:  Positive for fatigue. Negative for activity change, appetite change, fever and unexpected weight change.  HENT:  Negative for congestion, ear pain, rhinorrhea, sinus pressure and sore throat.   Eyes:  Positive for visual disturbance. Negative for pain and redness.  Respiratory:  Negative for cough, shortness of breath and wheezing.   Cardiovascular:  Negative for chest pain and palpitations.  Gastrointestinal:  Negative for abdominal pain, blood in stool, constipation and diarrhea.  Endocrine: Negative for polydipsia and polyuria.   Genitourinary:  Negative for dysuria, frequency and urgency.  Musculoskeletal:  Positive for arthralgias, back pain and gait problem. Negative for myalgias.  Skin:  Negative for pallor and rash.  Allergic/Immunologic: Negative for environmental allergies.  Neurological:  Negative for dizziness, syncope and headaches.  Hematological:  Negative for adenopathy. Does not bruise/bleed easily.  Psychiatric/Behavioral:  Negative for decreased concentration and dysphoric mood. The patient is not nervous/anxious.        Objective:   Physical Exam Constitutional:      General: She is not in acute distress.    Appearance: Normal appearance. She is well-developed. She is obese. She is not ill-appearing or diaphoretic.  HENT:     Head: Normocephalic and atraumatic.  Eyes:     Conjunctiva/sclera: Conjunctivae normal.     Pupils: Pupils are equal, round, and reactive to light.     Comments: Vision impaired   Neck:     Thyroid : No thyromegaly.     Vascular: No carotid bruit or JVD.  Cardiovascular:     Rate and Rhythm: Normal rate and regular rhythm.     Heart sounds: Normal heart sounds.     No gallop.  Pulmonary:     Effort: Pulmonary effort is normal. No respiratory distress.     Breath sounds: Normal breath sounds. No wheezing or rales.  Abdominal:     General: There is no distension or abdominal bruit.     Palpations: Abdomen is soft.  Musculoskeletal:     Cervical back: Normal range of motion and neck supple.     Right lower leg: No edema.     Left lower leg: No edema.     Comments: Limited rom knees and shoulders  No acute joint swelling  Lymphadenopathy:     Cervical: No cervical adenopathy.  Skin:    General: Skin is warm and dry.     Coloration: Skin is not pale.     Findings: No rash.  Neurological:     Mental Status: She is alert.     Coordination: Coordination normal.     Deep Tendon Reflexes: Reflexes are normal and symmetric. Reflexes normal.  Psychiatric:         Mood and Affect: Mood normal.           Assessment & Plan:   Problem List Items Addressed This Visit       Other   Prediabetes   Lab Results  Component Value Date   HGBA1C 5.8 03/14/2023   HGBA1C 5.9 03/10/2022   HGBA1C 5.9 03/10/2021   Interested in trial of metformin for weight and glucose control  Metformin xr 500 mg once daily with bkfast  Discussed possible gi side effects-will hold and update if not tolerated Follow up 3 mo      Morbid obesity (HCC)   Discussed how this problem influences overall health and the risks it imposes  Reviewed plan for weight loss with lower calorie diet (via better food choices (lower glycemic and portion control) along with exercise building up to or more than 30 minutes 5 days per week including some aerobic activity and strength training   Would like to refer to healthy weight and wellness but pt cannot get there every 2 weeks  Trial of metformin xr 500 mg daily (reviewed possible side effects)  If tolerated can increase dose   GLP-1 may be option later if insurance covered it       Relevant Medications   metFORMIN (GLUCOPHAGE-XR) 500 MG 24 hr tablet   Encounter for chronic pain management - Primary   Doing well with norco 5-325 mg up to every 6 h prn for knee and shoulder arthritis pain #90 per month  No side effects UDS and contract review today -  05/13/23  [facility] reviewed with no ref flags today (also takes xanax ) Refills done to fill today for norco (will call for subsequent refills) Aware she also takes alprazolam  for anxiety (sent with refills today) Continues orthopedic care also- had injections in knee and shoulder planned soon withDr Charlene (next week)  Enc continued work on [employer] loss/ still struggled with this trying metformin)   Meds ordered this encounter  Medications   metFORMIN (GLUCOPHAGE-XR) 500 MG 24 hr tablet    Sig: Take 1 tablet (500 mg total) by mouth daily with breakfast.    Dispense:  90 tablet     Refill:  0   HYDROcodone -acetaminophen  (NORCO) 5-325 MG tablet    Sig: Take 1 tablet by mouth every 6 (six) hours as needed for moderate pain (pain score 4-6) or severe pain (pain score 7-10).    Dispense:  90 tablet    Refill:  0   ALPRAZolam  (XANAX ) 1 MG tablet    Sig: TAKE 1 TABLET(1 MG) BY MOUTH THREE TIMES DAILY AS NEEDED    Dispense:  90 tablet    Refill:  3    This request is for a new prescription for a controlled substance as required by Federal/State law.         Chronic pain   Encouraged for chronic pain management today  Norco  Knees and shoulders and low back  Causes mobility impairment (along with blindness)   Handout for handicapped  parking up to date      Relevant Medications   HYDROcodone -acetaminophen  (NORCO) 5-325 MG tablet

## 2023-11-18 ENCOUNTER — Other Ambulatory Visit: Payer: Self-pay | Admitting: Family Medicine

## 2023-11-25 DIAGNOSIS — M7581 Other shoulder lesions, right shoulder: Secondary | ICD-10-CM | POA: Diagnosis not present

## 2023-11-25 DIAGNOSIS — M1712 Unilateral primary osteoarthritis, left knee: Secondary | ICD-10-CM | POA: Diagnosis not present

## 2023-12-01 ENCOUNTER — Other Ambulatory Visit: Payer: Self-pay | Admitting: Family Medicine

## 2023-12-14 ENCOUNTER — Ambulatory Visit: Payer: PPO

## 2023-12-14 ENCOUNTER — Encounter: Payer: Self-pay | Admitting: Family Medicine

## 2023-12-14 VITALS — Ht 61.0 in | Wt 290.0 lb

## 2023-12-14 DIAGNOSIS — Z0000 Encounter for general adult medical examination without abnormal findings: Secondary | ICD-10-CM

## 2023-12-14 DIAGNOSIS — Z748 Other problems related to care provider dependency: Secondary | ICD-10-CM | POA: Diagnosis not present

## 2023-12-14 DIAGNOSIS — Z658 Other specified problems related to psychosocial circumstances: Secondary | ICD-10-CM | POA: Diagnosis not present

## 2023-12-14 DIAGNOSIS — Z139 Encounter for screening, unspecified: Secondary | ICD-10-CM | POA: Diagnosis not present

## 2023-12-14 MED ORDER — HYDROCODONE-ACETAMINOPHEN 5-325 MG PO TABS: 1.0000 | ORAL_TABLET | Freq: Four times a day (QID) | ORAL | 0 refills | Status: DC | PRN

## 2023-12-14 NOTE — Progress Notes (Signed)
 Chief Complaint  Patient presents with   Medicare Wellness     Subjective:   Monica Madden is a 63 y.o. female who presents for a Medicare Annual Wellness Visit.  Allergies (verified) Diclofenac sodium, Etodolac, Metronidazole, Nsaids, Oxybutynin , Rosuvastatin , Sulfamethoxazole-trimethoprim, Tramadol, and Sulfa antibiotics   History: Past Medical History:  Diagnosis Date   Allergy    allergic rhinitis   Anemia    Anxiety    ATTACKS   Arthritis    Asthma    Cataract    left cataract removal 12/2008   Chronic back pain    OA of spine   Depression    GERD (gastroesophageal reflux disease)    Endo negative 02/2000   Hypertension 03/2008   IBS (irritable bowel syndrome)    MRSA infection 2008   Right leg   Obesity    Ovarian cyst    Retinitis pigmentosa    blind   Past Surgical History:  Procedure Laterality Date   APPENDECTOMY     BLADDER SURGERY  1968   age 75 ? dilitation   CATARACT EXTRACTION W/PHACO Right 11/11/2014   Procedure: CATARACT EXTRACTION PHACO AND INTRAOCULAR LENS PLACEMENT (IOC);  Surgeon: Steven Dingeldein, MD;  Location: [facility] ORS;  Service: Ophthalmology;  Laterality: Right;  LOT PACK: [id number] H US : 00:29.7 AP: 11.2 CDE: 7.14   CESAREAN SECTION     CHOLECYSTECTOMY     CYSTOSCOPY  03/2008   negative// Dr. Amiel urologist   ENDOMETRIAL BIOPSY  07/1998   negative   EYE SURGERY  12/2008   cataract removal left   HERNIA REPAIR  05/2006   ruptured hernia repair after fall and then two more surgeries within as many weeks for complications(05/2006) Umbilical hernia repair (06/1997)   KNEE ARTHROSCOPY W/ MENISCAL REPAIR     left knne   Family History  Problem Relation Age of Onset   Depression Mother    Heart disease Father    Diabetes Maternal Uncle    Hypertension Maternal Grandmother    Heart disease Paternal Grandmother    Heart disease Paternal Grandfather    Depression Brother        Depression secondary to MVA injuries    Hypertension Brother    Breast cancer Neg Hx    Social History   Occupational History   Not on file  Tobacco Use   Smoking status: Never   Smokeless tobacco: Never  Vaping Use   Vaping status: Never Used  Substance and Sexual Activity   Alcohol use: Yes    Comment: twice a year   Drug use: Yes    Types: Marijuana   Sexual activity: Never   Tobacco Counseling Counseling given: Not Answered  SDOH Screenings   Food Insecurity: Food Insecurity Present (12/14/2023)  Housing: Low Risk  (12/14/2023)  Transportation Needs: Unmet Transportation Needs (12/14/2023)  Utilities: Not At Risk (12/14/2023)  Alcohol Screen: Low Risk  (12/14/2023)  Depression (PHQ2-9): Low Risk  (12/14/2023)  Financial Resource Strain: High Risk (12/14/2023)  Physical Activity: Inactive (12/14/2023)  Social Connections: Moderately Isolated (12/14/2023)  Stress: Stress Concern Present (12/14/2023)  Tobacco Use: Low Risk  (12/14/2023)  Health Literacy: Adequate Health Literacy (12/14/2023)   See flowsheets for full screening details  Depression Screen PHQ 2 & 9 Depression Scale- Over the past 2 weeks, how often have you been bothered by any of the following problems? Little interest or pleasure in doing things: 1 Feeling down, depressed, or hopeless (PHQ Adolescent also includes...irritable): 1 PHQ-2 Total Score: 2  Trouble falling or staying asleep, or sleeping too much: 0 Feeling tired or having little energy: 0 Poor appetite or overeating (PHQ Adolescent also includes...weight loss): 0 Feeling bad about yourself - or that you are a failure or have let yourself or your family down: 0 Trouble concentrating on things, such as reading the newspaper or watching television (PHQ Adolescent also includes...like school work): 0 Moving or speaking so slowly that other people could have noticed. Or the opposite - being so fidgety or restless that you have been moving around a lot more than usual: 0 Thoughts that  you would be better off dead, or of hurting yourself in some way: 0 PHQ-9 Total Score: 2 If you checked off any problems, how difficult have these problems made it for you to do your work, take care of things at home, or get along with other people?: Not difficult at all  Depression Treatment Depression Interventions/Treatment : Medication; Currently on Treatment     Goals Addressed             This Visit's Progress    I would like to get out of the house more       I would like to lose some weight soI can get knee replacement         Visit info / Clinical Intake: Medicare Wellness Visit Type:: Subsequent Annual Wellness Visit Persons participating in visit:: patient Medicare Wellness Visit Mode:: Telephone If telephone:: video declined Because this visit was a virtual/telehealth visit:: unable to obtan vitals due to lack of equipment If Telephone or Video please confirm:: I discussed the limitations of evaluation and management by telemedicine Patient Location:: home Provider Location:: home Information given by:: patient Interpreter Needed?: No Pre-visit prep was completed: yes AWV questionnaire completed by patient prior to visit?: no Living arrangements:: lives with spouse/significant other Patient's Overall Health Status Rating: good Typical amount of pain: some Does pain affect daily life?: (!) yes Are you currently prescribed opioids?: (!) yes  Dietary Habits and Nutritional Risks How many meals a day?: 2 Eats fruit and vegetables daily?: yes Most meals are obtained by: preparing own meals; eating out; having others provide food (help from family and friends) In the last 2 weeks, have you had any of the following?: none Diabetic:: no  Functional Status Activities of Daily Living (to include ambulation/medication): Independent Ambulation: Independent with device- listed below Home Assistive Devices/Equipment: Cane Medication Administration: Independent Home  Management: Independent Manage your own finances?: (!) no (daughter does for her as pt legally blind) Primary transportation is: family/friends Concerns about vision?: no *vision screening is required for WTM* Concerns about hearing?: no  Fall Screening Falls in the past year?: 0 Number of falls in past year: 0 Was there an injury with Fall?: 0 Fall Risk Category Calculator: 0 Patient Fall Risk Level: Low Fall Risk  Fall Risk Patient at Risk for Falls Due to: Impaired vision Fall risk Follow up: Education provided; Falls prevention discussed  Home and Transportation Safety: All rugs have non-skid backing?: N/A, no rugs All stairs or steps have railings?: yes Grab bars in the bathtub or shower?: (!) no Have non-skid surface in bathtub or shower?: (!) no Good home lighting?: yes Regular seat belt use?: yes Hospital stays in the last year:: no  Cognitive Assessment Difficulty concentrating, remembering, or making decisions? : no  Advance Directives (For Healthcare) Does Patient Have a Medical Advance Directive?: Yes Type of Advance Directive: Healthcare Power of [place]; Living will Copy of Healthcare  Power of Attorney in Chart?: No - copy requested Copy of Living Will in Chart?: No - copy requested  Reviewed/Updated  Reviewed/Updated: Reviewed All (Medical, Surgical, Family, Medications, Allergies, Care Teams, Patient Goals)       Objective:    Today's Vitals   12/14/23 1139  Weight: 290 lb (131.5 kg)  Height: 5' 1 (1.549 m)   Body mass index is 54.8 kg/m.  Current Medications (verified) Outpatient Encounter Medications as of 12/14/2023  Medication Sig   albuterol  (PROAIR  HFA) 108 (90 Base) MCG/ACT inhaler Inhale 2 puffs into the lungs every 6 (six) hours as needed for wheezing or shortness of breath.   ALPRAZolam  (XANAX ) 1 MG tablet TAKE 1 TABLET(1 MG) BY MOUTH THREE TIMES DAILY AS NEEDED   Ascorbic Acid (VITAMIN C) 1000 MG tablet Take 1,000 mg by mouth daily.   Vitamin C with [place]   Bee Pollen 500 MG CHEW Chew 500 mg by mouth in the morning and at bedtime.   BLACK ELDERBERRY PO Take 1,150 mg by mouth daily.   buPROPion  (WELLBUTRIN  XL) 300 MG 24 hr tablet TAKE 1 TABLET BY MOUTH EVERY DAY   Calcium  Carbonate-Vitamin D 600-400 MG-UNIT tablet Take 1 tablet by mouth daily.   Coenzyme Q10 (CO Q 10) 100 MG CAPS Take 1 Capful by mouth daily.   Cranberry-Vitamin C-Probiotic (AZO CRANBERRY PO) Take 500 mg by mouth every other day.   cyanocobalamin  (VITAMIN B12) 1000 MCG tablet Take 1,000 mcg by mouth daily.   cyclobenzaprine  (FLEXERIL ) 10 MG tablet Take 1 tablet (10 mg total) by mouth 3 (three) times daily as needed for muscle spasms. Caution of sedation   ezetimibe  (ZETIA ) 10 MG tablet TAKE 1 TABLET BY MOUTH EVERY DAY   fexofenadine  (ALLERGY RELIEF) 180 MG tablet Take 1 tablet (180 mg total) by mouth daily.   fluticasone  (FLONASE ) 50 MCG/ACT nasal spray SHAKE LIQUID AND SPRAY 2 SPRAYS INTO EACH NOSTRIL TWICE A DAY   HYDROcodone -acetaminophen  (NORCO) 5-325 MG tablet Take 1 tablet by mouth every 6 (six) hours as needed for moderate pain (pain score 4-6) or severe pain (pain score 7-10).   lisinopril  (ZESTRIL ) 5 MG tablet TAKE 1 TABLET (5 MG TOTAL) BY MOUTH DAILY.   metFORMIN  (GLUCOPHAGE -XR) 500 MG 24 hr tablet Take 1 tablet (500 mg total) by mouth daily with breakfast.   montelukast  (SINGULAIR ) 10 MG tablet TAKE 1 TABLET BY MOUTH EVERY DAY   Omega 3-6-9 Fatty Acids (OMEGA 3-6-9 COMPLEX PO) Take one tablet by mouth daily   omeprazole  (PRILOSEC) 40 MG capsule TAKE 1 CAPSULE BY MOUTH EVERY DAY   OVER THE COUNTER MEDICATION daily as needed. HYLAND'S CELL SALTS #8 MAGNESIA PHOSPHORICA   OVER THE COUNTER MEDICATION Take 10 mg by mouth daily. VITAL PROTEINS COLLAGEN PEPTIDES   sertraline  (ZOLOFT ) 50 MG tablet TAKE 1 TABLET BY MOUTH EVERY DAY   tolterodine  (DETROL  LA) 4 MG 24 hr capsule TAKE 1 CAPSULE(4 MG) BY MOUTH DAILY AS NEEDED   No facility-administered  encounter medications on file as of 12/14/2023.   Hearing/Vision screen Vision Screening - Comments:: UTD w/visits to Dr Dingeldein every 6 months Immunizations and Health Maintenance Health Maintenance  Topic Date Due   Pneumococcal Vaccine: 50+ Years (2 of 2 - PCV) 12/01/2019   Medicare Annual Wellness (AWV)  12/13/2023   HIV Screening  03/20/2024 (Originally 07/13/1975)   Zoster Vaccines- Shingrix (1 of 2) 02/14/2025 (Originally 07/13/1981)   Fecal DNA (Cologuard)  03/29/2024   Mammogram  04/04/2024   COVID-19 Vaccine (  8 - Moderna risk 2025-26 season) 04/10/2024   DTaP/Tdap/Td (3 - Td or Tdap) 10/29/2024   Cervical Cancer Screening (HPV/Pap Cotest)  05/22/2026   Influenza Vaccine  Completed   Hepatitis C Screening  Completed   Hepatitis B Vaccines 19-59 Average Risk  Aged Out   HPV VACCINES  Aged Out   Meningococcal B Vaccine  Aged Out   Colonoscopy  Discontinued        Assessment/Plan:  This is a routine wellness examination for Kaianna.  Patient Care Team: Tower, Laine LABOR, MD as PCP - General Dingeldein, Elspeth, MD (Ophthalmology)  I have personally reviewed and noted the following in the patient's chart:   Medical and social history Use of alcohol, tobacco or illicit drugs  Current medications and supplements including opioid prescriptions. Functional ability and status Nutritional status Physical activity Advanced directives List of other physicians Hospitalizations, surgeries, and ER visits in previous 12 months Vitals Screenings to include cognitive, depression, and falls Referrals and appointments  No orders of the defined types were placed in this encounter.  In addition, I have reviewed and discussed with patient certain preventive protocols, quality metrics, and best practice recommendations. A written personalized care plan for preventive services as well as general preventive health recommendations were provided to patient.   Erminio LITTIE Saris,  LPN   [id number]   No follow-ups on file.  After Visit Summary: (MyChart) Due to this being a telephonic visit, the after visit summary with patients personalized plan was offered to patient via MyChart   Nurse Notes: Pt is legally blind and relays during visit that she has limited transportation now therefore needs help w/transport to medical appts (husband works). Pt also needs help with SDOH (utilities and food at times).  She indicated she would like to get out more (help decrease depression).  AWV/CPE to be made simultaneously for 2027 year

## 2023-12-14 NOTE — Patient Instructions (Addendum)
 [name],  Thank you for taking the time for your Medicare Wellness Visit. I appreciate your continued commitment to your health goals. Please review the care plan we discussed, and feel free to reach out if I can assist you further.  Please note that Annual Wellness Visits do not include a physical exam. Some assessments may be limited, especially if the visit was conducted virtually. If needed, we may recommend an in-person follow-up with your provider.  Ongoing Care Seeing your primary care provider every 3 to 6 months helps us  monitor your health and provide consistent, personalized care.   Referrals If a referral was made during today's visit and you haven't received any updates within two weeks, please contact the referred provider directly to check on the status.  [email or web address]  A referral has been placed for you to check and see what additional resources are available to you.   If you haven't heard from anyone within the next 7 business days, please call them and let them know a referral has been placed for you Phone: [phone]   Recommended Screenings:  Health Maintenance  Topic Date Due   Pneumococcal Vaccine for age over 60 (2 of 2 - PCV) 12/01/2019   Medicare Annual Wellness Visit  12/13/2023   HIV Screening  03/20/2024*   Zoster (Shingles) Vaccine (1 of 2) 02/14/2025*   Cologuard (Stool DNA test)  03/29/2024   Breast Cancer Screening  04/04/2024   COVID-19 Vaccine (8 - Moderna risk 2025-26 season) 04/10/2024   DTaP/Tdap/Td vaccine (3 - Td or Tdap) 10/29/2024   Pap with HPV screening  05/22/2026   Flu Shot  Completed   Hepatitis C Screening  Completed   Hepatitis B Vaccine  Aged Out   HPV Vaccine  Aged Out   Meningitis B Vaccine  Aged Out   Colon Cancer Screening  Discontinued  *Topic was postponed. The date shown is not the original due date.       12/14/2023   11:53 AM  Advanced Directives  Does Patient Have a Medical Advance Directive? Yes  Type of  Estate Agent of [place];Living will  Copy of Healthcare Power of Attorney in Chart? No - copy requested    Vision: Annual vision screenings are recommended for early detection of glaucoma, cataracts, and diabetic retinopathy. These exams can also reveal signs of chronic conditions such as diabetes and high blood pressure.  Dental: Annual dental screenings help detect early signs of oral cancer, gum disease, and other conditions linked to overall health, including heart disease and diabetes.

## 2023-12-14 NOTE — Telephone Encounter (Signed)
 Name of Medication: Norco Name of [facility]: [facility] Last Fill or Written Date and Quantity: 11/15/23 # 90 tab/ 0 refill Last Office Visit and Type: pain med f/u 11/15/23 Next Office Visit and Type: pain med f/u 02/15/24 Last Controlled Substance Agreement Date: 05/13/23 Last UDS:05/13/23

## 2023-12-17 ENCOUNTER — Other Ambulatory Visit: Payer: Self-pay | Admitting: Family Medicine

## 2023-12-18 ENCOUNTER — Other Ambulatory Visit: Payer: Self-pay | Admitting: Family Medicine

## 2023-12-26 ENCOUNTER — Telehealth: Payer: Self-pay

## 2023-12-26 NOTE — Progress Notes (Unsigned)
 Complex Care Management Note Care Guide Note  12/26/2023 Name: Monica Madden MRN: [id number] DOB: Dec 16, 1960   Complex Care Management Outreach Attempts: An unsuccessful telephone outreach was attempted today to offer the patient information about available complex care management services.  Follow Up Plan:  Additional outreach attempts will be made to offer the patient complex care management information and services.   Encounter Outcome:  No Answer-Left Voicemail  Leotis Rase [facility], [facility] Guide  Direct Dial: [phone]  Fax [phone]

## 2023-12-27 ENCOUNTER — Ambulatory Visit: Payer: Self-pay

## 2023-12-27 ENCOUNTER — Other Ambulatory Visit: Payer: Self-pay | Admitting: Family Medicine

## 2023-12-27 ENCOUNTER — Encounter: Payer: Self-pay | Admitting: Family Medicine

## 2023-12-27 MED ORDER — VALACYCLOVIR HCL 1 G PO TABS: 1000.0000 mg | ORAL_TABLET | Freq: Three times a day (TID) | ORAL | 0 refills | Status: AC

## 2023-12-27 NOTE — Telephone Encounter (Signed)
 That does sound like shingles  Photo confirms it (thanks)   Sent valtrex  to take tid for 7 days  It calms down shingles and can shorten course (not a cure)  Keep clean and dry  Avoid contact things as much as you can  Sometimes a gentle cool compress can help  Keep me posted Continue your current pain medication   Keep me posted re: symptoms  I understand she does not have transportation currently

## 2023-12-27 NOTE — Telephone Encounter (Signed)
 Shingles does not cross the midline -so it would not be on both shoulders (unless she has 2 kinds of rashes) Watching for the picture-will route back to myself

## 2023-12-27 NOTE — Telephone Encounter (Signed)
 I don't see a picture in the chart yet  She cannot see well, but how does family member describe it and where is it on her body ?  One side or both?   Thanks

## 2023-12-27 NOTE — Telephone Encounter (Unsigned)
 Copied from CRM [id number]. Topic: General - Other >> Dec 27, 2023  4:18 PM Monica Madden wrote: Reason for CRM: Pt stated that she was informed that she would receive a call from Dr.Tower or her nurse in regards to her shoulders. Pt stated that the rash in on both shoulders but more so on her left shoulder and is also on the left side of her neck. Pt stated that it started from the back of her hairline and has moved down since then. Pt stated that she took a picture of the rash but has to wait until her daughter gets home to send it. Pt would like a callback in regards to this concern.

## 2023-12-27 NOTE — Telephone Encounter (Signed)
 Pt said it isn't both shoulders she said it started on her hair line in the back of her head and then spread across her left shoulder only, now its it's spreading more on the left side of her neck and top of the left arm also. Pt said she can't even wear her hair down and has to wear a very light shirt due to rash. Monica Madden will upload a pic tonight once she is home from school but wanted PCP to know it is not both shoulders it's only on left side.   Pt stated she couldn't get a ride to a UC due to her dad not driving in weather so needs PCP to advise if possible

## 2023-12-27 NOTE — Telephone Encounter (Signed)
 FYI Only or Action Required?: Action required by provider: PT is requesting medication for shingles to be called in. Pt is unable to come in for appt or to have a vv. Pt is legally blind. Daughter, Augustin is sending in a picture of rash to MyChart this afternoon for you to review.  Patient was last seen in primary care on 11/15/2023 by Randeen Laine LABOR, MD.  Called Nurse Triage reporting Herpes Zoster. - PT believes this is shingles - husband has had shingles in the past.  Symptoms began several days ago.  Interventions attempted: Nothing.  Symptoms are: gradually worsening.  Triage Disposition: See Physician Within 24 Hours  Patient/caregiver understands and will follow disposition?: No, wishes to speak with PCP                      Copied from CRM #[id number]. Topic: Clinical - Red Word Triage >> Dec 27, 2023  1:01 PM Suzen RAMAN wrote: Red Word that prompted transfer to Nurse Triage: shingles across of back(painful and itchiness)...would like to know if medication can be call into the pharmacy. Reason for Disposition  [1] Shingles rash (matches SYMPTOMS) AND [2] onset < 72 hours ago (3 days)  Answer Assessment - Initial Assessment Questions 1. APPEARANCE of RASH: What does the rash look like?      Red and bumpy, kind of like pimples 2. LOCATION: Where is the rash located?      Underneath neck /down back /across left arm and shoulders. 3. ONSET: When did the rash start?      Friday/Saturday 4. ITCHING: Does the rash itch? If Yes, ask: How bad is the itch?  (Scale 1-10; or mild, moderate, severe)     8-9/10 where it started is most itchy 5. PAIN: Does the rash hurt? If Yes, ask: How bad is the pain?  (Scale 0-10; or none, mild, moderate, severe)     5-6/10 - last night a 7/10 6. OTHER SYMPTOMS: Do you have any other symptoms? (e.g., fever)     no  Protocols used: Shingles (Zoster)-A-AH

## 2023-12-27 NOTE — Telephone Encounter (Signed)
 Both shoulders or one shoulder? Does it cross the mid line?   (Note said shoulders not shoulder) Thanks

## 2023-12-27 NOTE — Telephone Encounter (Signed)
 Please see bottom of triage note, triage nurse give details of rash, also daughter said she would upload pic this afternoon (still not in chart yet)

## 2023-12-28 NOTE — Progress Notes (Signed)
 Complex Care Management Note Care Guide Note  12/28/2023 Name: Monica Madden MRN: [id number] DOB: 1960-04-25   Complex Care Management Outreach Attempts: A second unsuccessful outreach was attempted today to offer the patient with information about available complex care management services.  Follow Up Plan:  Additional outreach attempts will be made to offer the patient complex care management information and services.   Encounter Outcome:  No Answer-Left voicemail  Leotis Rase [facility], [facility] Guide  Direct Dial: [phone]  Fax [phone]

## 2023-12-28 NOTE — Telephone Encounter (Signed)
 Pt notified of Dr. Graham comments and verbalized understanding. Will get med and keep us  posted

## 2023-12-28 NOTE — Telephone Encounter (Signed)
 Please call and check in with her  How is she doing?  Did she pick up and start the valtrex ? Thanks

## 2023-12-29 NOTE — Progress Notes (Signed)
 Complex Care Management Note Care Guide Note  12/29/2023 Name: Monica Madden MRN: [id number] DOB: [birth year]   Complex Care Management Outreach Attempts: A third unsuccessful outreach was attempted today to offer the patient with information about available complex care management services.  Follow Up Plan:  No further outreach attempts will be made at this time. We have been unable to contact the patient to offer or enroll patient in complex care management services.  Encounter Outcome:  No Answer-Left voicemail  Leotis Rase [facility], [facility] Guide  Direct Dial: [phone]  Fax [phone]

## 2023-12-30 NOTE — Telephone Encounter (Signed)
 Pt said the base of her neck she can tell that spot is getting smaller but overall they are still there but the pain isn't quite as bad and they are not spreading anymore. Pt said she feels like she is starting to improve slightly but she will get Augustin to upload a pic on Sunday so Dr. Randeen can see them and advise what she thinks. ER/UC precautions given over weekend and pt will keep us  posted   FYI to PCP

## 2024-01-01 ENCOUNTER — Other Ambulatory Visit: Payer: Self-pay | Admitting: Family Medicine

## 2024-01-02 MED ORDER — CYCLOBENZAPRINE HCL 10 MG PO TABS: 10.0000 mg | ORAL_TABLET | Freq: Three times a day (TID) | ORAL | 3 refills | Status: AC | PRN

## 2024-01-08 ENCOUNTER — Encounter: Payer: Self-pay | Admitting: Family Medicine

## 2024-01-13 ENCOUNTER — Encounter: Payer: Self-pay | Admitting: Family Medicine

## 2024-01-13 MED ORDER — HYDROCODONE-ACETAMINOPHEN 5-325 MG PO TABS: 1.0000 | ORAL_TABLET | Freq: Four times a day (QID) | ORAL | 0 refills | Status: DC | PRN

## 2024-01-13 NOTE — Telephone Encounter (Signed)
 Name of Medication: Norco [facility] Last Fill or Written Date and Quantity: 12/14/23 # 90 tab/ 0 refill Last Office Visit and Type: pain med f/u 11/15/23 Next Office Visit and Type: pain med f/u 02/15/24 Last Controlled Substance Agreement Date: 05/13/23 Last UDS:05/13/23

## 2024-02-14 ENCOUNTER — Other Ambulatory Visit: Payer: Self-pay | Admitting: *Deleted

## 2024-02-14 MED ORDER — HYDROCODONE-ACETAMINOPHEN 5-325 MG PO TABS: 1.0000 | ORAL_TABLET | Freq: Four times a day (QID) | ORAL | 0 refills | Status: AC | PRN

## 2024-02-14 NOTE — Telephone Encounter (Signed)
 Last office visit 11/15/2023 for Pain Management.  Last refilled 01/13/2024 for #90 with no refills.  UDS/Contract 05/13/23.  Next appt: 02/22/24 for pain management.

## 2024-02-14 NOTE — Telephone Encounter (Signed)
 Copied from CRM #[id number]. Topic: Clinical - Medication Refill >> Feb 14, 2024 12:30 PM Sophia H wrote: Medication: HYDROcodone -acetaminophen  (NORCO) 5-325 MG tablet  Has the patient contacted their pharmacy? Yes, patient needed to contact office due to type of medication.   This is the [facility] [address] [place] [facility] [address] [place] [address] Phone: [phone] Fax: [phone]  Is this the correct pharmacy for this prescription? Yes If no, delete pharmacy and type the correct one.   Has the prescription been filled recently? Yes  Is the patient out of the medication? Has enough to get through Thursday.   Has the patient been seen for an appointment in the last year OR does the patient have an upcoming appointment? Yes, appt rescheduled to Feb 4th due to weather, needing bridge.   Can we respond through MyChart? Yes  Agent: Please be advised that Rx refills may take up to 3 business days. We ask that you follow-up with your pharmacy.

## 2024-02-15 ENCOUNTER — Ambulatory Visit: Admitting: Family Medicine

## 2024-02-22 ENCOUNTER — Ambulatory Visit: Admitting: Family Medicine

## 2024-02-22 ENCOUNTER — Encounter: Payer: Self-pay | Admitting: Family Medicine

## 2024-02-22 VITALS — BP 135/78 | HR 97 | Temp 98.0°F | Ht 61.0 in | Wt 289.0 lb

## 2024-02-22 DIAGNOSIS — M170 Bilateral primary osteoarthritis of knee: Secondary | ICD-10-CM

## 2024-02-22 DIAGNOSIS — L989 Disorder of the skin and subcutaneous tissue, unspecified: Secondary | ICD-10-CM

## 2024-02-22 DIAGNOSIS — C439 Malignant melanoma of skin, unspecified: Secondary | ICD-10-CM | POA: Insufficient documentation

## 2024-02-22 DIAGNOSIS — G8929 Other chronic pain: Secondary | ICD-10-CM

## 2024-02-22 DIAGNOSIS — Z6841 Body Mass Index (BMI) 40.0 and over, adult: Secondary | ICD-10-CM | POA: Insufficient documentation

## 2024-02-22 NOTE — Assessment & Plan Note (Signed)
 Encouraged for chronic pain management today  Norco  Knees and shoulders and low back  Causes mobility impairment (along with blindness)   Form for handicapped parking up to date

## 2024-02-22 NOTE — Assessment & Plan Note (Signed)
 Recent excision of melanoma on back  Checked site -reassuring /no signs of infection  Sent for derm notes  Has follow up planned for re excision

## 2024-02-22 NOTE — Patient Instructions (Addendum)
 No change in medications  Sent us  a message when you are due for norco and xanax  refills   Your biopsy site looks good-no sign of infection  Keep clean with soap and water and continue vaseline   Follow up with dermatology as planned    Follow up in late April for next pain visit   We can do your physical labs at your physical appointment

## 2024-02-22 NOTE — Assessment & Plan Note (Signed)
 Discussed how this problem influences overall health and the risks it imposes  Reviewed plan for weight loss with lower calorie diet (via better food choices (lower glycemic and portion control) along with exercise building up to or more than 30 minutes 5 days per week including some aerobic activity and strength training   Would like to refer to healthy weight and wellness but pt cannot get there every 2 weeks  Trial of metformin xr 500 mg daily (reviewed possible side effects)  If tolerated can increase dose   GLP-1 may be option later if insurance covered it

## 2024-02-22 NOTE — Progress Notes (Signed)
 "  Subjective:    Patient ID: Monica Madden, female    DOB: 1960-11-30, 64 y.o.   MRN: [id number]  HPI  Wt Readings from Last 3 Encounters:  02/22/24 289 lb (131.1 kg)  12/14/23 290 lb (131.5 kg)  11/15/23 295 lb 8 oz (134 kg)   54.61 kg/m  Vitals:   02/22/24 1416 02/22/24 1447  BP: (!) 146/82 135/78  Pulse: 97   Temp: 98 F (36.7 C)   SpO2: 98%    Pt presents for 3 month encounter for chronic pain medication  Also re check area of prior mole removal  Morbid obesity  Follow up after shingles   Not eating as much   Got through shingles well     Indication for chronic opioid: arthritis in knees, shoulders, ankles  Medication and dose: norco 5-325 mg every 6 h prn  # pills per month: 90  Last UDS date: 05/13/23 low risk   Opioid Treatment Agreement signed (Y/N): yes  Opioid Treatment Agreement last reviewed with patient: 05/13/23   [facility] reviewed this encounter (include red flags):  today, no red flags  Last refil 02/14/24  Also takes alprazolam  for anxiety  90 pills on 02/15/24  Orthopedic follow up 11/7 Due for shots  Has not followed up due to weather -plans to get schedule    More stress lately - husband was in hospital with MI/anemia  Currently pt has to be more physical to take care of him and do extra household talks  More pain than usual due to that  Anxiety meds have been helpful  Does deep breathing exercises-helpful also    Has mole bx  Early stage melanoma  Dr Chrystie will resect the rest of it  Healing from bx        Lab Results  Component Value Date   NA 138 03/14/2023   K 4.4 03/14/2023   CO2 30 03/14/2023   GLUCOSE 94 03/14/2023   BUN 24 (H) 03/14/2023   CREATININE 0.88 03/14/2023   CALCIUM  9.6 03/14/2023   GFR 71.31 03/14/2023   GFRNONAA 100.19 09/26/2009   Lab Results  Component Value Date   ALT 12 03/14/2023   AST 11 03/14/2023   ALKPHOS 64 03/14/2023   BILITOT 0.7 03/14/2023        Patient Active Problem  List   Diagnosis Date Noted   Body mass index (BMI) 50.0-59.9, adult (HCC) 02/22/2024   Immunity status testing 05/13/2023   Encounter for screening mammogram for breast cancer 03/21/2023   Vitamin B12 deficiency 03/21/2023   Skin lesion 03/21/2023   Current use of proton pump inhibitor 02/27/2023   Colon cancer screening 03/17/2021   Paresthesia of both hands 08/06/2020   Medicare annual wellness visit, subsequent 12/01/2018   Encounter for chronic pain management 02/07/2017   Chronic pain 11/17/2015   Prediabetes 11/09/2015   Osteoarthritis of knee 07/18/2013   Routine general medical examination at a health care facility 06/30/2011   Amenorrhea 06/30/2011   IRRITABLE BOWEL SYNDROME 08/07/2009   KNEE PAIN, BILATERAL 08/07/2009   Pain in joint involving lower leg 08/07/2009   LEG PAIN, LEFT 10/09/2008   Hyperlipidemia 05/09/2008   Essential hypertension 03/29/2008   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 06/05/2007   Incisional hernia 05/12/2007   FREQUENCY, URINARY 05/12/2007   DEPENDENT EDEMA, LEGS 11/03/2006   Morbid obesity (HCC) 07/15/2006   Dysthymic disorder 07/15/2006   Migraine headache 07/15/2006   RETINITIS PIGMENTOSA 07/15/2006   VARICOSE VEINS, LOWER  EXTREMITIES 07/15/2006   Allergic rhinitis 07/15/2006   GERD 07/15/2006   BACK PAIN, LUMBAR, CHRONIC 07/15/2006   INCONTINENCE, URGE 07/15/2006   Asthma 07/15/2006   Past Medical History:  Diagnosis Date   Allergy    allergic rhinitis   Anemia    Anxiety    ATTACKS   Arthritis    Asthma    Cataract    left cataract removal 12/2008   Chronic back pain    OA of spine   Depression    GERD (gastroesophageal reflux disease)    Endo negative 02/2000   Hypertension 03/2008   IBS (irritable bowel syndrome)    MRSA infection 2008   Right leg   Obesity    Ovarian cyst    Retinitis pigmentosa    blind   Past Surgical History:  Procedure Laterality Date   APPENDECTOMY     BLADDER SURGERY  1968    age 59 ? dilitation   CATARACT EXTRACTION W/PHACO Right 11/11/2014   Procedure: CATARACT EXTRACTION PHACO AND INTRAOCULAR LENS PLACEMENT (IOC);  Surgeon: Steven Dingeldein, MD;  Location: [facility] ORS;  Service: Ophthalmology;  Laterality: Right;  LOT PACK: [id number] H US : 00:29.7 AP: 11.2 CDE: 7.14   CESAREAN SECTION     CHOLECYSTECTOMY     CYSTOSCOPY  03/2008   negative// Dr. Amiel urologist   ENDOMETRIAL BIOPSY  07/1998   negative   EYE SURGERY  12/2008   cataract removal left   HERNIA REPAIR  05/2006   ruptured hernia repair after fall and then two more surgeries within as many weeks for complications(05/2006) Umbilical hernia repair (06/1997)   KNEE ARTHROSCOPY W/ MENISCAL REPAIR     left knne   Social History[1] Family History  Problem Relation Age of Onset   Depression Mother    Heart disease Father    Diabetes Maternal Uncle    Hypertension Maternal Grandmother    Heart disease Paternal Grandmother    Heart disease Paternal Grandfather    Depression Brother        Depression secondary to MVA injuries   Hypertension Brother    Breast cancer Neg Hx    Allergies[2] Medications Ordered Prior to Encounter[3]  Review of Systems  Constitutional:  Negative for activity change, appetite change, fatigue, fever and unexpected weight change.  HENT:  Negative for congestion, ear pain, rhinorrhea, sinus pressure and sore throat.   Eyes:  Positive for visual disturbance. Negative for pain and redness.  Respiratory:  Negative for cough, shortness of breath and wheezing.   Cardiovascular:  Negative for chest pain and palpitations.  Gastrointestinal:  Negative for abdominal pain, blood in stool, constipation and diarrhea.  Endocrine: Negative for polydipsia and polyuria.  Genitourinary:  Negative for dysuria, frequency and urgency.  Musculoskeletal:  Positive for arthralgias, back pain and gait problem. Negative for myalgias.  Skin:  Negative for pallor and rash.       Skin bx on  back   Recent shingles-now much improved   Allergic/Immunologic: Negative for environmental allergies.  Neurological:  Negative for dizziness, syncope and headaches.  Hematological:  Negative for adenopathy. Does not bruise/bleed easily.  Psychiatric/Behavioral:  Negative for decreased concentration and dysphoric mood. The patient is not nervous/anxious.        Objective:   Physical Exam Constitutional:      General: She is not in acute distress.    Appearance: Normal appearance. She is well-developed. She is obese. She is not ill-appearing or diaphoretic.  HENT:     Head:  Normocephalic and atraumatic.  Eyes:     Conjunctiva/sclera: Conjunctivae normal.     Pupils: Pupils are equal, round, and reactive to light.     Comments: Baseline poor vision   Neck:     Thyroid : No thyromegaly.     Vascular: No carotid bruit or JVD.  Cardiovascular:     Rate and Rhythm: Normal rate and regular rhythm.     Heart sounds: Normal heart sounds.     No gallop.  Pulmonary:     Effort: Pulmonary effort is normal. No respiratory distress.     Breath sounds: Normal breath sounds. No wheezing or rales.  Abdominal:     General: There is no distension or abdominal bruit.     Palpations: Abdomen is soft.  Musculoskeletal:     Cervical back: Normal range of motion and neck supple.     Right lower leg: No edema.     Left lower leg: No edema.     Comments: Deg changes/limited rom in knees and shoulders   Lymphadenopathy:     Cervical: No cervical adenopathy.  Skin:    General: Skin is warm and dry.     Coloration: Skin is not pale.     Findings: No rash.     Comments: 1.5 cm round biopsy site on mid back- with healing granulation tissue and 1-2 mm collar of erythema Looks to be well healing   Neurological:     Mental Status: She is alert.     Coordination: Coordination normal.     Deep Tendon Reflexes: Reflexes are normal and symmetric. Reflexes normal.  Psychiatric:        Mood and Affect:  Mood normal.     Comments: Mood is stable Candidly discusses symptoms and stressors             Assessment & Plan:   Problem List Items Addressed This Visit       Musculoskeletal and Integument   Skin lesion   Recent excision of melanoma on back  Checked site -reassuring /no signs of infection  Sent for derm notes  Has follow up planned for re excision       Osteoarthritis of knee   Continues orthopedic follow up for knees and shoulders both Getting injections   Not candidate for knee replacement unless bmi decreases         Other   Morbid obesity (HCC)   Discussed how this problem influences overall health and the risks it imposes  Reviewed plan for weight loss with lower calorie diet (via better food choices (lower glycemic and portion control) along with exercise building up to or more than 30 minutes 5 days per week including some aerobic activity and strength training    Would like to refer to healthy weight and wellness but pt cannot get there every 2 weeks  Trial of metformin  xr 500 mg daily (reviewed possible side effects)  If tolerated can increase dose    GLP-1 may be option later if insurance covered it       Encounter for chronic pain management - Primary   Doing well with norco 5-325 mg up to every 6 h prn for knee and shoulder arthritis pain #90 per month  No side effects UDS and contract review today -  05/13/23  [facility] reviewed with no ref flags today (also takes xanax ) Last refill for norco 1/27 (due to weather)-will call when due for refill   Continues orthopedic care also- had injections in  knee and shoulder planned soon withDr Charlene Hard continued work on [employer] loss/ still struggled with this trying metformin )           Chronic pain   Encouraged for chronic pain management today  Norco  Knees and shoulders and low back  Causes mobility impairment (along with blindness)   Form for handicapped parking up to date      Body mass  index (BMI) 50.0-59.9, adult [facility])   Discussed how this problem influences overall health and the risks it imposes  Reviewed plan for weight loss with lower calorie diet (via better food choices (lower glycemic and portion control) along with exercise building up to or more than 30 minutes 5 days per week including some aerobic activity and strength training    Would like to refer to healthy weight and wellness but pt cannot get there every 2 weeks  Trial of metformin  xr 500 mg daily (reviewed possible side effects)  If tolerated can increase dose (did try-plans to try again )    GLP-1 may be option later if insurance covered it          [1]  Social History Tobacco Use   Smoking status: Never   Smokeless tobacco: Never  Vaping Use   Vaping status: Never Used  Substance Use Topics   Alcohol use: Yes    Comment: twice a year   Drug use: Yes    Types: Marijuana  [2]  Allergies Allergen Reactions   Diclofenac Sodium     REACTION: allergic/ made asthma bad   Etodolac     REACTION: GI   Metronidazole     REACTION: GI side eff   Nsaids Other (See Comments)    GI Upset   Oxybutynin  Itching   Rosuvastatin      Myalgias    Sulfamethoxazole-Trimethoprim    Tramadol Other (See Comments)    Caused elevated BP   Sulfa Antibiotics Itching and Rash  [3]  Current Outpatient Medications on File Prior to Visit  Medication Sig Dispense Refill   albuterol  (PROAIR  HFA) 108 (90 Base) MCG/ACT inhaler Inhale 2 puffs into the lungs every 6 (six) hours as needed for wheezing or shortness of breath. 1 each 5   ALPRAZolam  (XANAX ) 1 MG tablet TAKE 1 TABLET(1 MG) BY MOUTH THREE TIMES DAILY AS NEEDED 90 tablet 3   Ascorbic Acid (VITAMIN C) 1000 MG tablet Take 1,000 mg by mouth daily.  Vitamin C with Rosehips     Bee Pollen 500 MG CHEW Chew 500 mg by mouth in the morning and at bedtime.     BLACK ELDERBERRY PO Take 1,150 mg by mouth daily.     buPROPion  (WELLBUTRIN  XL) 300 MG 24 hr tablet TAKE 1  TABLET BY MOUTH EVERY DAY 90 tablet 3   Calcium  Carbonate-Vitamin D 600-400 MG-UNIT tablet Take 1 tablet by mouth daily.     Coenzyme Q10 (CO Q 10) 100 MG CAPS Take 1 Capful by mouth daily.     Cranberry-Vitamin C-Probiotic (AZO CRANBERRY PO) Take 500 mg by mouth every other day.     cyanocobalamin  (VITAMIN B12) 1000 MCG tablet Take 1,000 mcg by mouth daily.     cyclobenzaprine  (FLEXERIL ) 10 MG tablet Take 1 tablet (10 mg total) by mouth 3 (three) times daily as needed for muscle spasms. Caution of sedation 30 tablet 3   ezetimibe  (ZETIA ) 10 MG tablet TAKE 1 TABLET BY MOUTH EVERY DAY 90 tablet 1   fexofenadine  (ALLERGY RELIEF) 180 MG  tablet Take 1 tablet (180 mg total) by mouth daily. 90 tablet 1   fluticasone  (FLONASE ) 50 MCG/ACT nasal spray SHAKE LIQUID AND SPRAY 2 SPRAYS INTO EACH NOSTRIL TWICE A DAY 48 mL 1   HYDROcodone -acetaminophen  (NORCO) 5-325 MG tablet Take 1 tablet by mouth every 6 (six) hours as needed for moderate pain (pain score 4-6) or severe pain (pain score 7-10). 90 tablet 0   lisinopril  (ZESTRIL ) 5 MG tablet TAKE 1 TABLET (5 MG TOTAL) BY MOUTH DAILY. 90 tablet 1   metFORMIN  (GLUCOPHAGE -XR) 500 MG 24 hr tablet Take 1 tablet (500 mg total) by mouth daily with breakfast. 90 tablet 0   montelukast  (SINGULAIR ) 10 MG tablet TAKE 1 TABLET BY MOUTH EVERY DAY 90 tablet 1   Omega 3-6-9 Fatty Acids (OMEGA 3-6-9 COMPLEX PO) Take one tablet by mouth daily     omeprazole  (PRILOSEC) 40 MG capsule TAKE 1 CAPSULE BY MOUTH EVERY DAY 90 capsule 1   OVER THE COUNTER MEDICATION daily as needed. HYLAND'S CELL SALTS #8 MAGNESIA PHOSPHORICA     OVER THE COUNTER MEDICATION Take 10 mg by mouth daily. VITAL PROTEINS COLLAGEN PEPTIDES     sertraline  (ZOLOFT ) 50 MG tablet TAKE 1 TABLET BY MOUTH EVERY DAY 90 tablet 1   tolterodine  (DETROL  LA) 4 MG 24 hr capsule TAKE 1 CAPSULE(4 MG) BY MOUTH DAILY AS NEEDED 90 capsule 0   No current facility-administered medications on file prior to visit.   "

## 2024-02-22 NOTE — Assessment & Plan Note (Signed)
 Continues orthopedic follow up for knees and shoulders both Getting injections   Not candidate for knee replacement unless bmi decreases

## 2024-02-22 NOTE — Assessment & Plan Note (Signed)
 Doing well with norco 5-325 mg up to every 6 h prn for knee and shoulder arthritis pain #90 per month  No side effects UDS and contract review today -  05/13/23  [facility] reviewed with no ref flags today (also takes xanax ) Last refill for norco 1/27 (due to weather)-will call when due for refill   Continues orthopedic care also- had injections in knee and shoulder planned soon withDr Charlene Hard continued work on [employer] loss/ still struggled with this trying metformin )

## 2024-02-22 NOTE — Assessment & Plan Note (Addendum)
 Discussed how this problem influences overall health and the risks it imposes  Reviewed plan for weight loss with lower calorie diet (via better food choices (lower glycemic and portion control) along with exercise building up to or more than 30 minutes 5 days per week including some aerobic activity and strength training    Would like to refer to healthy weight and wellness but pt cannot get there every 2 weeks  Trial of metformin  xr 500 mg daily (reviewed possible side effects)  If tolerated can increase dose (did try-plans to try again )    GLP-1 may be option later if insurance covered it

## 2024-02-22 NOTE — Assessment & Plan Note (Signed)
 Bx site is healing  For derm follow up -re excision later this month  Doing well overall

## 2024-03-15 ENCOUNTER — Other Ambulatory Visit

## 2024-03-22 ENCOUNTER — Encounter: Admitting: Family Medicine

## 2024-05-15 ENCOUNTER — Ambulatory Visit: Admitting: Family Medicine
